# Patient Record
Sex: Female | Born: 1935 | Race: Black or African American | Hispanic: No | Smoking: Never smoker
Health system: Southern US, Community
[De-identification: ages and names within clinical notes are randomized; demographics above are authoritative.]

## PROBLEM LIST (undated history)

## (undated) DIAGNOSIS — I10 Essential (primary) hypertension: Secondary | ICD-10-CM

## (undated) DIAGNOSIS — E559 Vitamin D deficiency, unspecified: Secondary | ICD-10-CM

## (undated) DIAGNOSIS — E785 Hyperlipidemia, unspecified: Secondary | ICD-10-CM

## (undated) DIAGNOSIS — H409 Unspecified glaucoma: Secondary | ICD-10-CM

## (undated) DIAGNOSIS — M810 Age-related osteoporosis without current pathological fracture: Secondary | ICD-10-CM

## (undated) DIAGNOSIS — N184 Chronic kidney disease, stage 4 (severe): Secondary | ICD-10-CM

## (undated) DIAGNOSIS — M109 Gout, unspecified: Secondary | ICD-10-CM

## (undated) HISTORY — DX: Hyperlipidemia, unspecified: E78.5

## (undated) HISTORY — DX: Age-related osteoporosis without current pathological fracture: M81.0

## (undated) HISTORY — DX: Chronic kidney disease, stage 4 (severe): N18.4

## (undated) HISTORY — DX: Vitamin D deficiency, unspecified: E55.9

## (undated) HISTORY — PX: EYE SURGERY: SHX253

## (undated) HISTORY — DX: Unspecified glaucoma: H40.9

## (undated) HISTORY — DX: Essential (primary) hypertension: I10

## (undated) HISTORY — DX: Gout, unspecified: M10.9

---

## 1968-01-23 HISTORY — PX: BREAST SURGERY: SHX581

## 1999-01-23 HISTORY — PX: TEMPORAL ARTERY BIOPSY / LIGATION: SUR132

## 1999-05-02 ENCOUNTER — Encounter: Payer: Self-pay | Admitting: Emergency Medicine

## 1999-05-02 ENCOUNTER — Encounter: Admission: RE | Admit: 1999-05-02 | Discharge: 1999-05-02 | Payer: Self-pay | Admitting: Emergency Medicine

## 1999-09-15 ENCOUNTER — Encounter: Payer: Self-pay | Admitting: Emergency Medicine

## 1999-09-15 ENCOUNTER — Encounter: Admission: RE | Admit: 1999-09-15 | Discharge: 1999-09-15 | Payer: Self-pay | Admitting: Emergency Medicine

## 1999-09-26 ENCOUNTER — Ambulatory Visit (HOSPITAL_COMMUNITY): Admission: RE | Admit: 1999-09-26 | Discharge: 1999-09-26 | Payer: Self-pay | Admitting: *Deleted

## 1999-09-26 ENCOUNTER — Encounter (INDEPENDENT_AMBULATORY_CARE_PROVIDER_SITE_OTHER): Payer: Self-pay

## 2000-10-16 ENCOUNTER — Encounter (INDEPENDENT_AMBULATORY_CARE_PROVIDER_SITE_OTHER): Payer: Self-pay | Admitting: *Deleted

## 2000-10-16 ENCOUNTER — Ambulatory Visit (HOSPITAL_COMMUNITY): Admission: RE | Admit: 2000-10-16 | Discharge: 2000-10-16 | Payer: Self-pay | Admitting: *Deleted

## 2002-11-01 ENCOUNTER — Emergency Department (HOSPITAL_COMMUNITY): Admission: EM | Admit: 2002-11-01 | Discharge: 2002-11-01 | Payer: Self-pay | Admitting: Emergency Medicine

## 2002-11-01 ENCOUNTER — Encounter: Payer: Self-pay | Admitting: Emergency Medicine

## 2003-05-24 ENCOUNTER — Encounter: Admission: RE | Admit: 2003-05-24 | Discharge: 2003-05-24 | Payer: Self-pay | Admitting: Emergency Medicine

## 2003-10-19 ENCOUNTER — Other Ambulatory Visit: Admission: RE | Admit: 2003-10-19 | Discharge: 2003-10-19 | Payer: Self-pay | Admitting: Obstetrics and Gynecology

## 2003-12-14 ENCOUNTER — Emergency Department (HOSPITAL_COMMUNITY): Admission: EM | Admit: 2003-12-14 | Discharge: 2003-12-14 | Payer: Self-pay | Admitting: Emergency Medicine

## 2004-06-23 ENCOUNTER — Ambulatory Visit (HOSPITAL_COMMUNITY): Admission: RE | Admit: 2004-06-23 | Discharge: 2004-06-23 | Payer: Self-pay | Admitting: Obstetrics and Gynecology

## 2004-11-30 ENCOUNTER — Other Ambulatory Visit: Admission: RE | Admit: 2004-11-30 | Discharge: 2004-11-30 | Payer: Self-pay | Admitting: Obstetrics and Gynecology

## 2005-02-12 ENCOUNTER — Encounter: Admission: RE | Admit: 2005-02-12 | Discharge: 2005-02-12 | Payer: Self-pay | Admitting: Obstetrics and Gynecology

## 2005-09-05 ENCOUNTER — Ambulatory Visit: Payer: Self-pay | Admitting: Cardiology

## 2005-09-05 ENCOUNTER — Encounter: Payer: Self-pay | Admitting: Cardiology

## 2005-09-05 ENCOUNTER — Ambulatory Visit: Admission: RE | Admit: 2005-09-05 | Discharge: 2005-09-05 | Payer: Self-pay | Admitting: Emergency Medicine

## 2006-02-08 ENCOUNTER — Ambulatory Visit: Payer: Self-pay | Admitting: Cardiovascular Disease

## 2006-02-26 ENCOUNTER — Encounter: Admission: RE | Admit: 2006-02-26 | Discharge: 2006-02-26 | Payer: Self-pay | Admitting: Nephrology

## 2006-03-05 ENCOUNTER — Ambulatory Visit: Payer: Self-pay | Admitting: Cardiovascular Disease

## 2006-09-04 ENCOUNTER — Ambulatory Visit: Payer: Self-pay | Admitting: Cardiovascular Disease

## 2009-05-24 ENCOUNTER — Encounter: Admission: RE | Admit: 2009-05-24 | Discharge: 2009-05-24 | Payer: Self-pay | Admitting: Family Medicine

## 2010-06-06 NOTE — Assessment & Plan Note (Signed)
[facility] HEALTHCARE                            CARDIOLOGY OFFICE NOTE   NAME:Kimberly Avery, Kimberly Avery                 MRN:          [id number]  DATE:09/04/2006                            DOB:          Apr 18, 1923    Kimberly Avery returns today for followup.  I have seen her in the past  for her PACs, palpitations, some postural lightheadedness and  hypertension.   The patient, actually, has been doing fairly well.  I am a little bit  concerned about her weight loss, she seems to have lost quite a bit of  weight without trying.  She indicates 2 years ago weighing 140 pounds or  so, and now she is down to 108.  Her initial weight when I saw her in  January of 2008 was 111.   I told her to follow up with Dr. Lorenz Avery for this in regards to  possibility of occult cancer.   This also does not help the patient's postural symptoms.  She brought me  a list of blood pressures from home.  She can drop as low as 80  systolic.  She really has not had that much in regards to dizziness or  presyncope.  For the most part her pressures have been over 110.   Last time I saw her I told her to stop her indapamide on the days where  her blood pressure seems to be running low.  This seems to have helped.  She has not had any significant PND or orthopnea, no lower extremity  edema, no chest pain and no syncope.   She has been compliant with her other meds.   We did a 2D echocardiogram on the patient, and there was an EF of 60%  with no significant valvular heart disease.   Her review of systems is otherwise negative.   MEDICATIONS:  1. Lisinopril 20 a day.  2. Indapamide 2.5 mg on days when her blood pressure is high.  3. Toprol 100 a day.  4. An aspirin a day.  5. Actonel 35 a day.  6. Lipitor 10 a day.   PHYSICAL EXAMINATION:  Remarkable for an elderly black female in no  distress.  She is quite asthenic.  Affect is appropriate.  Weight is 108, blood pressure is 155/73  sitting and 160/80 standing,  pulse is 62 and regular.  Respiratory rate is 12.  She is afebrile.  HEENT:  Normal.  NECK:  Supple.  There is no lymphadenopathy, no thyromegaly, no JVP  elevation, no bruits.  LUNGS:  Clear with good diaphragmatic motion, no wheezing.  There is an S1, S2 with normal heart sounds.  PMI is normal.  ABDOMEN:  Benign, bowel sounds are positive.  No tenderness, no  hepatosplenomegaly, no hepatojugular reflux, no triple A, no bruits.  Distal pulses are intact with no edema.  NEURO:  Nonfocal.  SKIN:  Warm and dry.  There is no muscular weakness.   IMPRESSION:  1. Fluctuating blood pressure, hypertension, well controlled.  Would      not increase her medicine any more given some of the fluctuations  that she has.  She will continue her Toprol and ACE inhibitor as      well as low-salt diet.  2. Periods of hypotension.  I am not sure if this is related to her      weight loss.  I would ask Dr. Lorenz Avery to do a random cortisol level      and a TSH and T4.  I think that the indapamide would be the drug to      hold from time to time.  She will call me if her postural symptoms      get any worse.  I am loathe to start her on Florinef or __________      since she can have early significant systolic hypertension.  3. History of premature atrial contractions.  No evidence of      arrhythmia and no evidence of A-flutter or A-fib.  She will have a      CardioNet monitor if her palpitations were to return.  4. Hypercholesterolemia.  Continue Lipitor 10 mg a day, followup lipid      and liver profile in 6 months.     Kimberly Pick. Eden Emms, MD, [facility]  Electronically Signed    PCN/MedQ  DD: 09/04/2006  DT: 09/05/2006  Job #: [id number]   cc:   Kimberly Avery, M.D.

## 2010-06-09 NOTE — Op Note (Signed)
[facility]  Patient:    Kimberly Avery, Kimberly Avery                 MRN: [id number] Proc. Date: 09/26/99 Adm. Date:  [id number] Attending:  Stephenie Acres CC:         Reuben Likes, M.D.                           Operative Report  PREOPERATIVE DIAGNOSES:  Headaches.  POSTOPERATIVE DIAGNOSES:  Headaches.  PROCEDURE:  Left temporal artery biopsy.  ANESTHESIA:  Local.  SURGEON:  Danna Hefty.  DESCRIPTION OF PROCEDURE:  The patient was taken to the minor room. The left temporal region was prepped and draped in the normal sterile fashion.  Using 1% lidocaine and local anesthesia, the skin and subcutaneous tissue in the preauricular region was anesthetized. A small AP incision was made, dissected down onto the temporal artery. This was dissected free of surrounding structures. The 2 ends were ligated using 4-0 silk ligatures and approximately a 1 cm segment was taken. The proximal tie apparently became loose and therefore there was some bleeding. The edge of the artery was then regrasped and suture ligated using a 4-0 silk ligature. Adequate hemostasis was then insured. The skin was closed with 4-0 monocryl, Steri-Strips were applied. The patient tolerated the procedure well. DD:  09/26/99 TD:  09/26/99 Job: [id number]

## 2010-06-09 NOTE — Assessment & Plan Note (Signed)
[facility] HEALTHCARE                            CARDIOLOGY OFFICE NOTE   NAME:Kimberly Avery, Kimberly Avery                 MRN:          [id number]  DATE:03/05/2006                            DOB:          [birth year]    SUBJECTIVE:  Kimberly Avery returns today for followup.  She is a delightful  77 year old patient.  She has significant hypertension.  Her blood  pressure readings at home have been adequate.  At times she does drop  below 100, but is asymptomatic.  I told her in the future that she  should stop her indapamide if this happens.  She had a normal 2-D  echocardiogram.  Despite her age, I do not think she needs a stress  test.  She has a normal baseline electrocardiogram.  She is not having  any significant chest pain.  She had been having palpitations and short  bursts of PSVT.  These are improved on Toprol.  She has not had any  syncope, PND or orthopnea or lower extremity edema.   PHYSICAL EXAMINATION:  VITAL SIGNS:  Today the blood pressure is 130/70,  pulse 70 and regular.  There are no PACs.  HEENT:  Normal.  NECK:  Carotids without bruits.  LUNGS:  Clear.  HEART:  There is an S1 and S2.  Normal heart sounds.  ABDOMEN:  Benign.  EXTREMITIES:  Lower extremity pulses intact.  No edema.   MEDICATIONS:  1. Lisinopril 20 mg q.d.  2. Indapamide 2.5 mg q.d.  3. Toprol 100 mg q.d.  4. One aspirin q.d.  5. Actonel 35 mg weekly.  6. Lipitor 10 mg q.d.   IMPRESSION/PLAN:  1. Stable, blood pressure well-controlled on three medications.  The      patient to cut back on indapamide if her pressures consistently run      below 90.  2. The patient's premature atrial contractions and palpitations are      improved on beta blocker therapy.  3. Hyperlipidemia, well-controlled on 10 mg of Lipitor with an low-      density lipoprotein of less than 100.   As indicated, she has not had a stress test, and I do not think she  needs one since she is not having chest  pain and is active, with a  normal electrocardiogram.    Theron Arista C. Eden Emms, MD, [facility]  Electronically Signed   PCN/MedQ  DD: 03/05/2006  DT: 03/05/2006  Job #: [id number]

## 2010-06-09 NOTE — Assessment & Plan Note (Signed)
[facility] HEALTHCARE                            CARDIOLOGY OFFICE NOTE   NAME:Kimberly Avery, Kimberly Avery                 MRN:          [id number]  DATE:02/08/2006                            DOB:          [birth year]    Kimberly Avery is an 77 year old patient of Dr. Lorenz Coaster, who was referred  for palpitations and SVT.  The patient is actually quite healthy for her  age.  She tells me she has had multiple cardiac workups in the past;  however, I have no records of this.  She indicates having previous  echoes and stress tests.  Specifically, there was an echo from last year  at [facility].  She says she has always had a strong heart.  She has  had palpitations over the years.  About a month ago she had a  significant episode where her heart was racing for quite some time.  Dr.  Lorenz Coaster got a Holter monitor on her, and she indeed is having some fairly  frequent episodes of PSVT and frequent PACs.  I reviewed the monitor  strips, which took about 10 minutes.   She does not appear to have atrial fibrillation and would not appear to  need Coumadin.  Two weeks ago Dr. Lorenz Coaster increased her Toprol up to 100  mg a day, and this seems to have helped and her symptoms have quieted  some.  She has not had any significant syncope, chest pain, PND, or  orthopnea.   She does have a history of hypertension and is on medication for this.  She is a nondiabetic.  She does not smoke.   REVIEW OF SYSTEMS:  Otherwise remarkable for no significant TIA or CVA.  She has not had a previous history of atrial fibrillation.   She is retired.  She lives with her 48 year old husband.  Both of them  are in good shape.  Her husband was in the [employer] and she has 4 children,  3 of whom are also in the service.  They seem to be having a few more  problems, but overall the patient seems to be doing well for her age and  not under too much stress.  She has had previous left cataract eye  surgery in 2000,  previous left temporal artery biopsy in 2001, previous  subtotal mastectomy on the left in 1970.   Her mother had a cardiac arrest but at age 28, and her father had a CVA  at age 96.   There is no premature disease in the family in regard to the heart.   She is on:  1. Lisinopril 10 mg a day.  2. Indapamide 2.5 mg a day.  3. Toprol 100 mg a day.  4. Baby aspirin a day.  5. Calcium and vitamin D.   PHYSICAL EXAMINATION:  GENERAL:  She is a thin, healthy-appearing woman  who appears younger than her stated age.  HEENT:  Normal.  There is no thyromegaly, no lymphadenopathy.  LUNGS:  Clear.  There is an S1, S2, with a very short systolic murmur.  ABDOMEN:  Benign.  EXTREMITIES:  Lower extremities with  intact pulses.  No edema.  NEUROLOGIC:  Nonfocal.   EKG shows sinus rhythm with no acute changes and frequent PACs.   IMPRESSION:  The patient appears to have atrial arrhythmias that are not  the type that should require Coumadin.  She is taking a baby aspirin a  day.  She has only been on the higher dose of Toprol for about 2 weeks.   We will try to get her records to ensure that structurally she had a  normal heart.  She really does not have significant risk factors except  for hypertension and her age for coronary disease.  She is not having  chest pain, and I do not think a stress test is needed.  I will see her  back in about 4 weeks to see how her symptoms are doing.  If she were to  have increasing symptoms, it may be worthwhile to add a calcium channel  blocker to her Toprol.  She has tended toward bradycardia in the past  and her Toprol dose was actually decreased at one point.  I do not think  I would go any higher than 100 mg of Toprol in an octogenarian.  When I  see her in 4 weeks if she continues to be fairly asymptomatic, I think  that I would just keep her on the beta blocker.   Clearly, at her age I would like to avoid any kind of antiarrhythmics in  terms of  suppressing her atrial beats, as these may be proarrhythmic in  the ventricle.   I talked at length to the patient about the anticoagulation issues and  do not think she needs Coumadin, and she will continue taking a baby  aspirin.  Further recommendations will be based on my review of her echo  from [facility] and other records that I can find and follow-up visit  in 4 weeks.     Kimberly Pick. Eden Emms, MD, [facility]  Electronically Signed    PCN/MedQ  DD: 02/08/2006  DT: 02/08/2006  Job #: [id number]   cc:   Reuben Likes, M.D.

## 2011-07-13 ENCOUNTER — Ambulatory Visit
Admission: RE | Admit: 2011-07-13 | Discharge: 2011-07-13 | Disposition: A | Discharge status: Home / Self Care | Payer: Federal, State, Local not specified - PPO | Source: Ambulatory Visit | Attending: Family Medicine | Admitting: Family Medicine

## 2011-07-13 ENCOUNTER — Other Ambulatory Visit: Payer: Self-pay | Admitting: Family Medicine

## 2011-07-13 DIAGNOSIS — R05 Cough: Secondary | ICD-10-CM

## 2012-12-10 ENCOUNTER — Other Ambulatory Visit: Payer: Self-pay | Admitting: Gastroenterology

## 2012-12-10 DIAGNOSIS — R109 Unspecified abdominal pain: Secondary | ICD-10-CM

## 2012-12-15 ENCOUNTER — Ambulatory Visit
Admission: RE | Admit: 2012-12-15 | Discharge: 2012-12-15 | Disposition: A | Discharge status: Home / Self Care | Payer: Federal, State, Local not specified - PPO | Source: Ambulatory Visit | Attending: Gastroenterology | Admitting: Gastroenterology

## 2012-12-15 DIAGNOSIS — R109 Unspecified abdominal pain: Secondary | ICD-10-CM

## 2013-02-11 ENCOUNTER — Other Ambulatory Visit: Payer: Self-pay | Admitting: Gastroenterology

## 2013-02-11 DIAGNOSIS — R1013 Epigastric pain: Secondary | ICD-10-CM

## 2013-02-26 ENCOUNTER — Ambulatory Visit
Admission: RE | Admit: 2013-02-26 | Discharge: 2013-02-26 | Disposition: A | Discharge status: Home / Self Care | Payer: Federal, State, Local not specified - PPO | Source: Ambulatory Visit | Attending: Gastroenterology | Admitting: Gastroenterology

## 2013-02-26 DIAGNOSIS — R1013 Epigastric pain: Secondary | ICD-10-CM

## 2013-07-23 ENCOUNTER — Other Ambulatory Visit: Payer: Self-pay | Admitting: Family Medicine

## 2013-07-23 ENCOUNTER — Ambulatory Visit
Admission: RE | Admit: 2013-07-23 | Discharge: 2013-07-23 | Disposition: A | Discharge status: Home / Self Care | Payer: Federal, State, Local not specified - PPO | Source: Ambulatory Visit | Attending: Family Medicine | Admitting: Family Medicine

## 2013-07-23 DIAGNOSIS — M25532 Pain in left wrist: Secondary | ICD-10-CM

## 2013-09-21 ENCOUNTER — Encounter: Payer: Self-pay | Admitting: Internal Medicine

## 2013-09-21 ENCOUNTER — Ambulatory Visit (INDEPENDENT_AMBULATORY_CARE_PROVIDER_SITE_OTHER): Payer: Federal, State, Local not specified - PPO | Admitting: Internal Medicine

## 2013-09-21 VITALS — BP 113/66 | HR 65 | Ht 64.0 in | Wt 91.4 lb

## 2013-09-21 DIAGNOSIS — I951 Orthostatic hypotension: Secondary | ICD-10-CM

## 2013-09-21 DIAGNOSIS — I491 Atrial premature depolarization: Secondary | ICD-10-CM

## 2013-09-21 DIAGNOSIS — R55 Syncope and collapse: Secondary | ICD-10-CM

## 2013-09-21 MED ORDER — METOPROLOL TARTRATE 25 MG PO TABS: 12.5000 mg | ORAL_TABLET | Freq: Two times a day (BID) | ORAL | Status: DC

## 2013-09-21 NOTE — Patient Instructions (Addendum)
Your physician has requested that you have an echocardiogram. Echocardiography is a painless test that uses sound waves to create images of your heart. It provides your doctor with information about the size and shape of your heart and how well your heart's chambers and valves are working. This procedure takes approximately one hour. There are no restrictions for this procedure.  Your physician has recommended you make the following change in your medication.Marland Kitchen TAKE metoprolol tartrate 12.5mg  twice daily.  STOP indapamide.   Your physician recommends that you schedule a follow-up appointment after your echo.

## 2013-09-21 NOTE — Progress Notes (Signed)
Patient ID: Kimberly Avery, female   DOB: 09/08/1923, 80 y.o.   MRN: [id number]    OFFICE NOTE  Chief Complaint:  Syncope  Primary Care Physician: Marjorie Smolder, MD  HPI:  Kimberly Avery is an 80 yo female with history of HTN, HLD, CKD stage 4 presenting for new patient evaluation.  Seen as a new patient for syncopal event 2 months ago. Notes she was walking outside around her property and was ambulating without any difficulty. Was going up a slight incline and the next thing she knows she was on the ground. She states she was laying flat when she noticed she was on the ground. Does not remember falling, though remembers feeling her head hit when she landed. Did not trip and states this was not a mechanical fall. Denies chest pain, shortness of breath, dizziness, palpitations, no light headedness prior to this event. She has never had a syncopal event before. No incontinence. No history of seizure. No post-ictal period. No more syncopal events. Tries to drink a lot of fluid, though was told she was drinking too much and now has been drinking less.  Notes some light headedness with getting up too quick. Does not occur too often and does ok if she gets up slowly. Daughter notes she stumbles and looses balance a lot. Notes an additional mechanical fall recently as well. No history of cardiac issues. No history of catheterization or stress test.   Notes she has HTN and has issues with this being up and down like a see-saw. Lowest blood pressure this week was 94/59 with a pulse of 59. Highest BP was 156/85 with pulse of 73.   Of note she has seen Dr Johnsie Cancel in the past for her HTN. He noted that she had a normal echo in 05/2010. He also noted she had PACs and short bursts of PSVT that improved with addition of metoprolol. Was last seen in 05/2010.  PMHx:  Past Medical History  Diagnosis Date  . Hypertension   . Hyperlipidemia   . Gout   . Osteoporosis   . CKD (chronic kidney  disease) stage 4, GFR 15-29 ml/min   . Glaucoma   . Vitamin D deficiency     Past Surgical History  Procedure Laterality Date  . Breast surgery Bilateral 1970    mastectomies - fiboradenoma  . Eye surgery  2003, 2004  . Temporal artery biopsy / ligation  2001    FAMHx:  Family History  Problem Relation Age of Onset  . Heart attack Mother     MI while in hospital, RA, HTN  . Stroke Father     MI, CAD, alcoholism  . Cancer Brother     prostate cancer  . Heart Problems Sister     pacemaker, DM, RA, HTN    SOCHx:   reports that she has never smoked. She has never used smokeless tobacco. She reports that she does not drink alcohol or use illicit drugs.  ALLERGIES:  No Known Allergies  ROS: A comprehensive review of systems was negative except for: Cardiovascular: positive for syncope and light headedness  HOME MEDS: Current Outpatient Prescriptions  Medication Sig Dispense Refill  . alendronate (FOSAMAX) 70 MG tablet Take 1 tablet by mouth once a week.      . ALPRAZolam (XANAX) 0.5 MG tablet Take 0.25-0.5 mg by mouth at bedtime as needed for anxiety.      Marland Kitchen amLODipine-benazepril (LOTREL) 5-20 MG per capsule Take 1 capsule by mouth daily.      Marland Kitchen  aspirin 81 MG tablet Take 81 mg by mouth daily.      . dorzolamide-timolol (COSOPT) 22.3-6.8 MG/ML ophthalmic solution Place 1 drop into both eyes 2 (two) times daily.      . indapamide (LOZOL) 2.5 MG tablet Take 2.5 mg by mouth daily.      . metoprolol (LOPRESSOR) 50 MG tablet Take 25 mg by mouth daily.      . simvastatin (ZOCOR) 20 MG tablet Take 1 tablet by mouth daily.      . Vitamin D, Ergocalciferol, (DRISDOL) 50000 UNITS CAPS capsule Take 1 capsule by mouth every 14 (fourteen) days.       No current facility-administered medications for this visit.    LABS/IMAGING: No results found for this or any previous visit (from the past 48 hour(s)). No results found.  VITALS: BP 142/68  Pulse 80  Ht _0  (1.626 m)  Wt 91 lb  6.4 oz (41.459 kg)  BMI 15.68 kg/m2  EXAM: General appearance: alert, cooperative and no distress Neck: no adenopathy, no carotid bruit, no JVD and supple, symmetrical, trachea midline Lungs: clear to auscultation bilaterally Heart: regular rate and rhythm, S1, S2 normal, no murmur, click, rub or gallop Abdomen: soft, non-tender; bowel sounds normal; no masses,  no organomegaly Extremities: extremities normal, atraumatic, no cyanosis or edema Skin: Skin color, texture, turgor normal. No rashes or lesions Neurologic: CN 2-12 intact, 5/5 strength in bilateral biceps, triceps, grip, quads, hamstrings, plantar and dorsiflexion, sensation to light touch intact in bilateral UE and LE, 2+ patellar reflexes  EKG: Sinus rhythm with PACs, voltage criteria met for LVH  ASSESSMENT: 1.  Syncope- potentially multifactorial with possible contributors being orthostatic hypotension, arrhythmia, structural heart disease, and mechanical falls. Was orthostatic in the office today. The fact that she had a syncopal event with no prodrome is concerning for arrhythmia or structural issue. Note EKG with PACs. Her recent weight loss has also likely changed her need for so many blood pressure medications and she is likely over treated at this time.  2. HTN- over treated at this time. With orthostatic hypotension and low blood pressures at home.  3. Hyperlipidemia- on simvastatin.  PLAN: 1.   Will plan on performing an echo to evaluate for structural abnormalities that could lead to her syncopal events. We will decrease her metoprolol to 12.5 mg BID and discontinue her indapamide. At this time will hold off on monitor for arrhythmia as she's only had one episode. If she has a recurrent episode will consider loop recorder for further evaluation of arrhythmia. Will plan for follow-up after echo has been completed.    Tommi Rumps 09/21/2013, 2:34 PM  Pt. Seen and examined. Agree with the resident note as written.  Very pleasant 80 year old female who is quite thin and has had significant weight loss recently. She is describing positional dizziness and he was orthostatic in the office today with a 25 mmHg blood pressure change. There is no heart rate change on account of her beta-blockade. In addition she is on indapamide, which is associated with orthostatic hypotension. Recently she's had blood pressures which have been in the 90s. I've recommended stopping her indapamide and decreasing her metoprolol. This should help with her positional dizziness. However, I do not feel that this was vasovagal or neurocardiogenic syncope. She does not describe a prodrome prior to the events which is typical for such a condition. I am concerned about a primary arrhythmia. She was noted to have frequent PACs today. I would recommend  an echocardiogram and we will consider monitoring or perhaps a loop recorder placement in the future if she has another event.  Thank you again for the kind referral.  Pixie Casino, MD, [facility] Attending Cardiologist [place]

## 2013-09-25 ENCOUNTER — Ambulatory Visit (HOSPITAL_COMMUNITY)
Admission: RE | Admit: 2013-09-25 | Discharge: 2013-09-25 | Disposition: A | Discharge status: Home / Self Care | Payer: Federal, State, Local not specified - PPO | Source: Ambulatory Visit | Attending: Cardiovascular Disease | Admitting: Cardiovascular Disease

## 2013-09-25 DIAGNOSIS — I129 Hypertensive chronic kidney disease with stage 1 through stage 4 chronic kidney disease, or unspecified chronic kidney disease: Secondary | ICD-10-CM | POA: Diagnosis not present

## 2013-09-25 DIAGNOSIS — I080 Rheumatic disorders of both mitral and aortic valves: Secondary | ICD-10-CM | POA: Diagnosis not present

## 2013-09-25 DIAGNOSIS — I079 Rheumatic tricuspid valve disease, unspecified: Secondary | ICD-10-CM | POA: Insufficient documentation

## 2013-09-25 DIAGNOSIS — I359 Nonrheumatic aortic valve disorder, unspecified: Secondary | ICD-10-CM

## 2013-09-25 DIAGNOSIS — E785 Hyperlipidemia, unspecified: Secondary | ICD-10-CM | POA: Insufficient documentation

## 2013-09-25 DIAGNOSIS — R55 Syncope and collapse: Secondary | ICD-10-CM | POA: Insufficient documentation

## 2013-09-25 DIAGNOSIS — I2510 Atherosclerotic heart disease of native coronary artery without angina pectoris: Secondary | ICD-10-CM | POA: Insufficient documentation

## 2013-09-25 DIAGNOSIS — N189 Chronic kidney disease, unspecified: Secondary | ICD-10-CM | POA: Diagnosis not present

## 2013-09-25 NOTE — Progress Notes (Signed)
2D Echocardiogram Complete.  09/25/2013   Nelva Hauk, RDCS  

## 2013-10-06 ENCOUNTER — Ambulatory Visit (INDEPENDENT_AMBULATORY_CARE_PROVIDER_SITE_OTHER): Payer: Federal, State, Local not specified - PPO | Admitting: Internal Medicine

## 2013-10-06 ENCOUNTER — Encounter: Payer: Self-pay | Admitting: Internal Medicine

## 2013-10-06 ENCOUNTER — Ambulatory Visit (INDEPENDENT_AMBULATORY_CARE_PROVIDER_SITE_OTHER): Payer: Federal, State, Local not specified - PPO | Admitting: *Deleted

## 2013-10-06 VITALS — BP 122/62 | HR 56 | Ht 64.0 in | Wt 96.8 lb

## 2013-10-06 DIAGNOSIS — R55 Syncope and collapse: Secondary | ICD-10-CM

## 2013-10-06 DIAGNOSIS — I951 Orthostatic hypotension: Secondary | ICD-10-CM

## 2013-10-06 DIAGNOSIS — I491 Atrial premature depolarization: Secondary | ICD-10-CM

## 2013-10-06 NOTE — Progress Notes (Signed)
Patient ID: Kimberly Avery, female   DOB: 1924-01-13, 80 y.o.   MRN: [id number]    OFFICE NOTE  Chief Complaint:  Syncope  Primary Care Physician: Hollice Espy, MD  HPI:  Kimberly Avery is an 80 yo female with history of HTN, HLD, CKD stage 4 presenting for new patient evaluation.  Seen as a new patient for syncopal event 2 months ago. Notes she was walking outside around her property and was ambulating without any difficulty. Was going up a slight incline and the next thing she knows she was on the ground. She states she was laying flat when she noticed she was on the ground. Does not remember falling, though remembers feeling her head hit when she landed. Did not trip and states this was not a mechanical fall. Denies chest pain, shortness of breath, dizziness, palpitations, no light headedness prior to this event. She has never had a syncopal event before. No incontinence. No history of seizure. No post-ictal period. No more syncopal events. Tries to drink a lot of fluid, though was told she was drinking too much and now has been drinking less.  Notes some light headedness with getting up too quick. Does not occur too often and does ok if she gets up slowly. Daughter notes she stumbles and looses balance a lot. Notes an additional mechanical fall recently as well. No history of cardiac issues. No history of catheterization or stress test.   Notes she has HTN and has issues with this being up and down like a see-saw. Lowest blood pressure this week was 94/59 with a pulse of 59. Highest BP was 156/85 with pulse of 73.   Of note she has seen Dr Eden Emms in the past for her HTN. He noted that she had a normal echo in 05/2010. He also noted she had PACs and short bursts of PSVT that improved with addition of metoprolol. Was last seen in 05/2010.  Kimberly Avery returns today for followup. She reports no further syncopal episodes. We have asked her to stop her diuretic but she reports that  she did not do that for a known reasons. She continues to have some leg swelling. Her echocardiogram was performed and did not show any significant abnormalities. LVEF was 60-65% with mild AI and mild MR.  PMHx:  Past Medical History  Diagnosis Date  . Hypertension   . Hyperlipidemia   . Gout   . Osteoporosis   . CKD (chronic kidney disease) stage 4, GFR 15-29 ml/min   . Glaucoma   . Vitamin D deficiency     Past Surgical History  Procedure Laterality Date  . Breast surgery Bilateral 1970    mastectomies - fiboradenoma  . Eye surgery  2003, 2004  . Temporal artery biopsy / ligation  2001    FAMHx:  Family History  Problem Relation Age of Onset  . Heart attack Mother     MI while in hospital, RA, HTN  . Stroke Father     MI, CAD, alcoholism  . Cancer Brother     prostate cancer  . Heart Problems Sister     pacemaker, DM, RA, HTN    SOCHx:   reports that she has never smoked. She has never used smokeless tobacco. She reports that she does not drink alcohol or use illicit drugs.  ALLERGIES:  No Known Allergies  ROS: A comprehensive review of systems was negative except for: Cardiovascular: positive for syncope and light headedness  HOME MEDS: Current Outpatient  Prescriptions  Medication Sig Dispense Refill  . alendronate (FOSAMAX) 70 MG tablet Take 1 tablet by mouth once a week.      . ALPRAZolam (XANAX) 0.5 MG tablet Take 0.25-0.5 mg by mouth at bedtime as needed for anxiety.      Marland Kitchen amLODipine-benazepril (LOTREL) 5-20 MG per capsule Take 1 capsule by mouth daily.      Marland Kitchen aspirin 81 MG tablet Take 81 mg by mouth daily.      . dorzolamide-timolol (COSOPT) 22.3-6.8 MG/ML ophthalmic solution Place 1 drop into both eyes 2 (two) times daily.      . metoprolol (LOPRESSOR) 25 MG tablet Take 0.5 tablets (12.5 mg total) by mouth 2 (two) times daily.  30 tablet  6  . SIMETHICONE PO Take by mouth as needed.      . simvastatin (ZOCOR) 20 MG tablet Take 1 tablet by mouth daily.       . Vitamin D, Ergocalciferol, (DRISDOL) 50000 UNITS CAPS capsule Take 1 capsule by mouth every 14 (fourteen) days.       No current facility-administered medications for this visit.    LABS/IMAGING: No results found for this or any previous visit (from the past 48 hour(s)). No results found.  VITALS: BP 122/62  Pulse 56  Ht  (1.626 m)  Wt 96 lb 12.8 oz (43.908 kg)  BMI 16.61 kg/m2  EXAM: deferred  EKG: deferred  ASSESSMENT: 1.  Syncope- potentially multifactorial with possible contributors being orthostatic hypotension, arrhythmia, structural heart disease, and mechanical falls. Was orthostatic in the office today. The fact that she had a syncopal event with no prodrome is concerning for arrhythmia or structural issue. Note EKG with PACs. Her recent weight loss has also likely changed her need for so many blood pressure medications and she is likely over treated at this time.  2. HTN- over treated at this time. With orthostatic hypotension and low blood pressures at home.  3. Hyperlipidemia- on simvastatin.  PLAN: 1.   [name] has not had a reoccurrence of her syncope but remained orthostatic. She did not stop her diuretic is we have advised her to. In addition I think she would benefit from lower extremity compression stockings that are thigh high 20-30 mmHg. I recommend continuing this therapy, encouraging salt intake and liberalizing her diet as she is underweight and low protein status is probably contributing to her orthostatic hypotension.  Chrystie Nose, MD, [facility] Attending Cardiologist [facility]  Mackenzie Groom C 10/06/2013, 5:28 PM

## 2013-10-06 NOTE — Patient Instructions (Signed)
Compression Stockings - Thigh High 20-69mmHg  Your physician recommends that you schedule a follow-up appointment as needed.

## 2013-10-06 NOTE — Progress Notes (Signed)
Came in for compression stockings.  Measurements to be done  Right & Left leg:  Ankle 7.5                            Calf  11.5                            Thigh15.5  SIZE:  1  We do not carry size 1.  Info for a facility in [place] that might carry her size.  Rx with measurements given to patient along with the name and # of [employer] [id number].  If they are unable to supply her with her size recommended she go online since she is unable to afford the hose at the [employer] store.

## 2013-10-21 ENCOUNTER — Telehealth: Payer: Self-pay | Admitting: Internal Medicine

## 2013-10-21 NOTE — Telephone Encounter (Signed)
Pt would like  To Jenna about the compression soaks that Dr.Hilty instructed her to wear. She would like to know will she have to wear these soaks long term. Please call  Thanks

## 2013-10-21 NOTE — Telephone Encounter (Signed)
Spoke with patient. Informed her that Dr. Rennis GoldenHilty prescribed the stockings to help with syncope and to help promote blood return to heart. She was wondering if she will need to wear long-term and I informed her likely so, given her fluctuating BP and that it had gotten so low at times she passed out. Patient voiced understanding .

## 2013-10-29 ENCOUNTER — Other Ambulatory Visit: Payer: Self-pay | Admitting: *Deleted

## 2013-10-29 MED ORDER — METOPROLOL TARTRATE 25 MG PO TABS: 12.5000 mg | ORAL_TABLET | Freq: Two times a day (BID) | ORAL | Status: DC

## 2013-10-29 NOTE — Telephone Encounter (Signed)
Medication refilled electronically with adjustment to 90 supply

## 2013-11-02 ENCOUNTER — Telehealth: Payer: Self-pay | Admitting: Internal Medicine

## 2013-11-02 NOTE — Telephone Encounter (Signed)
Left message for pt to call.

## 2013-11-02 NOTE — Telephone Encounter (Signed)
Patient has some questions regarding her medications.

## 2013-11-02 NOTE — Telephone Encounter (Signed)
Spoke with pt, medication list reviewed with the patient.

## 2013-11-16 ENCOUNTER — Telehealth: Payer: Self-pay | Admitting: Internal Medicine

## 2013-11-16 NOTE — Telephone Encounter (Signed)
SPOKE TO PATIENT SHE STATES HER BLOOD PRESSURE HAS BEEN ELEVATED FOR THE LAST TWO DAYS YESTERDAY RANGE FROM 212 -183 SBP TODAY'S 210/94 PULSE 64 199/68, 187/87 PULSE 63, 184/91 PULSE 64  PATIENT STATES SHE HAS BEEN WEARING HER COMPRESSION STOCKING. NO NEW DIETARY INTAKE - SHE STATES SHE DID HAVE SOME BAKED SHRIMP ,   RN INFORMED PATIENT TO CONTINUE TO MONITOR-WILL DEFER TO DR HILTY  IF PATIENT NEEDS AN APPOINTMENT OR READJUST MEDICATION

## 2013-11-16 NOTE — Telephone Encounter (Signed)
Pt is having problem with her blood pressure,it started yesterday. Yesterdayit was 212,this morning it was 212 and now it 187.Pt thinks she needs to be seen.

## 2013-11-17 NOTE — Telephone Encounter (Signed)
Patient reports BP is 167 systolic at 8am today Patient would like to be seen r/t high BP Appointment made for 11/19/13 @ 330pm

## 2013-11-17 NOTE — Telephone Encounter (Signed)
If bp is still elevated, will need appointment (MLP or me this week).  Dr. HRexene Edison

## 2013-11-19 ENCOUNTER — Encounter: Payer: Self-pay | Admitting: Internal Medicine

## 2013-11-19 ENCOUNTER — Ambulatory Visit (INDEPENDENT_AMBULATORY_CARE_PROVIDER_SITE_OTHER): Payer: Federal, State, Local not specified - PPO | Admitting: Internal Medicine

## 2013-11-19 VITALS — BP 178/80 | HR 59 | Ht 64.0 in | Wt 94.9 lb

## 2013-11-19 DIAGNOSIS — I491 Atrial premature depolarization: Secondary | ICD-10-CM

## 2013-11-19 DIAGNOSIS — R55 Syncope and collapse: Secondary | ICD-10-CM

## 2013-11-19 DIAGNOSIS — I951 Orthostatic hypotension: Secondary | ICD-10-CM

## 2013-11-19 NOTE — Patient Instructions (Addendum)
Your physician wants you to follow-up in: 1 year. You will receive a reminder letter in the mail two months in advance. If you don't receive a letter, please call our office to schedule the follow-up appointment.  

## 2013-11-20 ENCOUNTER — Encounter: Payer: Self-pay | Admitting: Internal Medicine

## 2013-11-20 NOTE — Progress Notes (Signed)
Patient ID: Kimberly Parsonsrnestine E Nakamura, female   DOB: 12-17-1923, 80 y.o.   MRN: [id number]    OFFICE NOTE  Chief Complaint:  Syncope  Primary Care Physician: Hollice EspyGATES,DONNA RUTH, MD  HPI:  Kimberly Avery is an 80 yo female with history of HTN, HLD, CKD stage 4 presenting for new patient evaluation.  Seen as a new patient for syncopal event 2 months ago. Notes she was walking outside around her property and was ambulating without any difficulty. Was going up a slight incline and the next thing she knows she was on the ground. She states she was laying flat when she noticed she was on the ground. Does not remember falling, though remembers feeling her head hit when she landed. Did not trip and states this was not a mechanical fall. Denies chest pain, shortness of breath, dizziness, palpitations, no light headedness prior to this event. She has never had a syncopal event before. No incontinence. No history of seizure. No post-ictal period. No more syncopal events. Tries to drink a lot of fluid, though was told she was drinking too much and now has been drinking less.  Notes some light headedness with getting up too quick. Does not occur too often and does ok if she gets up slowly. Daughter notes she stumbles and looses balance a lot. Notes an additional mechanical fall recently as well. No history of cardiac issues. No history of catheterization or stress test.   Notes she has HTN and has issues with this being up and down like a see-saw. Lowest blood pressure this week was 94/59 with a pulse of 59. Highest BP was 156/85 with pulse of 73.   Of note she has seen Dr Eden EmmsNishan in the past for her HTN. He noted that she had a normal echo in 05/2010. He also noted she had PACs and short bursts of PSVT that improved with addition of metoprolol. Was last seen in 05/2010.  Kimberly Avery returns today for followup. She reports no further syncopal episodes. Her blood pressure is actually quite elevated today however  recheck did come down to about 140/82. She continues to wear high compression stockings and has had no further syncopal episodes. She is having some problems with gas and bloating and is currently taking simethicone.  PMHx:  Past Medical History  Diagnosis Date  . Hypertension   . Hyperlipidemia   . Gout   . Osteoporosis   . CKD (chronic kidney disease) stage 4, GFR 15-29 ml/min   . Glaucoma   . Vitamin D deficiency     Past Surgical History  Procedure Laterality Date  . Breast surgery Bilateral 1970    mastectomies - fiboradenoma  . Eye surgery  2003, 2004  . Temporal artery biopsy / ligation  2001    FAMHx:  Family History  Problem Relation Age of Onset  . Heart attack Mother     MI while in hospital, RA, HTN  . Stroke Father     MI, CAD, alcoholism  . Cancer Brother     prostate cancer  . Heart Problems Sister     pacemaker, DM, RA, HTN    SOCHx:   reports that she has never smoked. She has never used smokeless tobacco. She reports that she does not drink alcohol or use illicit drugs.  ALLERGIES:  No Known Allergies  ROS: A comprehensive review of systems was negative except for: Gastrointestinal: positive for gas  HOME MEDS: Current Outpatient Prescriptions  Medication Sig Dispense Refill  .  alendronate (FOSAMAX) 70 MG tablet Take 1 tablet by mouth once a week.      . ALPRAZolam (XANAX) 0.5 MG tablet Take 0.25-0.5 mg by mouth at bedtime as needed for anxiety.      Marland Kitchen. amLODipine-benazepril (LOTREL) 5-20 MG per capsule Take 1 capsule by mouth daily.      Marland Kitchen. aspirin 81 MG tablet Take 81 mg by mouth daily.      . dorzolamide-timolol (COSOPT) 22.3-6.8 MG/ML ophthalmic solution Place 1 drop into both eyes 2 (two) times daily.      . metoprolol tartrate (LOPRESSOR) 25 MG tablet Take 0.5 tablets (12.5 mg total) by mouth 2 (two) times daily.  90 tablet  1  . SIMETHICONE PO Take by mouth as needed.      . simvastatin (ZOCOR) 20 MG tablet Take 1 tablet by mouth daily.       . Vitamin D, Ergocalciferol, (DRISDOL) 50000 UNITS CAPS capsule Take 1 capsule by mouth every 14 (fourteen) days.       No current facility-administered medications for this visit.    LABS/IMAGING: No results found for this or any previous visit (from the past 48 hour(s)). No results found.  VITALS: BP 178/80  Pulse 59  Ht 5\' 4"  (1.626 m)  Wt 94 lb 14.4 oz (43.046 kg)  BMI 16.28 kg/m2  EXAM: GEN: Awake, NAD HEENT: PERRLA. EOMI Lungs: Clear bilaterally Cardiovascular: Regular rate and rhythm, S1, S2, 2/6 systolic murmur at left lower sternal border, 1/6 diastolic murmur at apex Abdomen: Soft, nontender positive bowel sounds Extremities: No edema Neurologic: Grossly nonfocal Psych: Normal  EKG: deferred  ASSESSMENT: 1.  Syncope 2. HTN- over treated at this time. With orthostatic hypotension and low blood pressures at home.  3. Hyperlipidemia- on simvastatin.  PLAN: 1.   Kimberly Avery has not had a reoccurrence of her syncope but remained orthostatic. She seems to be doing better now with compression stockings. I'm okay with running her blood pressure somewhat higher. We will continue current medications plan to see her back annually.  Chrystie NoseKenneth C. Dyana Magner, MD, [facility] Attending Cardiologist [facility]  Commodore Bellew C 11/20/2013, 6:26 PM

## 2014-03-01 ENCOUNTER — Telehealth: Payer: Self-pay | Admitting: Internal Medicine

## 2014-03-01 NOTE — Telephone Encounter (Signed)
Mr.Potvin is calling because she is having a problem with gas and saw that Dr. Rennis GoldenHilty once prescribed Simethicone for her and wants to know if he can prescribe for her . Please call .   Thanks

## 2014-03-01 NOTE — Telephone Encounter (Signed)
Pt is calling in to retract her last message , this message was not meant for Dr.Hilty

## 2014-03-23 ENCOUNTER — Other Ambulatory Visit: Payer: Self-pay | Admitting: Family Medicine

## 2014-03-23 ENCOUNTER — Ambulatory Visit
Admission: RE | Admit: 2014-03-23 | Discharge: 2014-03-23 | Disposition: A | Discharge status: Home / Self Care | Payer: Federal, State, Local not specified - PPO | Source: Ambulatory Visit | Attending: Family Medicine | Admitting: Family Medicine

## 2014-03-23 DIAGNOSIS — R109 Unspecified abdominal pain: Secondary | ICD-10-CM

## 2014-03-23 DIAGNOSIS — R634 Abnormal weight loss: Secondary | ICD-10-CM

## 2014-03-24 ENCOUNTER — Telehealth: Payer: Self-pay | Admitting: Internal Medicine

## 2014-03-24 NOTE — Telephone Encounter (Signed)
Line busy

## 2014-03-24 NOTE — Telephone Encounter (Signed)
Pt says her blood pressure keep running up,not all the time.Please call to advise.

## 2014-03-30 NOTE — Telephone Encounter (Signed)
Pt had an elevated BP last week and had followed up about this w/ PCP. She has not had any issues since. She thinks it was related to stomach pain she was having which she's seen GI doc for this past week as well.  Advised to keep a check on BP & if elevated again to call us. Pt voiced understanding.

## 2014-04-02 ENCOUNTER — Encounter: Payer: Self-pay | Admitting: Internal Medicine

## 2014-04-02 ENCOUNTER — Telehealth: Payer: Self-pay | Admitting: Internal Medicine

## 2014-04-02 ENCOUNTER — Ambulatory Visit (INDEPENDENT_AMBULATORY_CARE_PROVIDER_SITE_OTHER): Payer: Federal, State, Local not specified - PPO | Admitting: Internal Medicine

## 2014-04-02 VITALS — BP 144/62 | HR 67 | Ht 64.0 in | Wt 95.0 lb

## 2014-04-02 DIAGNOSIS — R0989 Other specified symptoms and signs involving the circulatory and respiratory systems: Secondary | ICD-10-CM

## 2014-04-02 DIAGNOSIS — I10 Essential (primary) hypertension: Secondary | ICD-10-CM

## 2014-04-02 DIAGNOSIS — R55 Syncope and collapse: Secondary | ICD-10-CM

## 2014-04-02 DIAGNOSIS — I951 Orthostatic hypotension: Secondary | ICD-10-CM

## 2014-04-02 DIAGNOSIS — I491 Atrial premature depolarization: Secondary | ICD-10-CM

## 2014-04-02 MED ORDER — BENAZEPRIL HCL 20 MG PO TABS: 20.0000 mg | ORAL_TABLET | Freq: Every day | ORAL | Status: DC

## 2014-04-02 NOTE — Telephone Encounter (Signed)
Called mary mason back and LMTCB

## 2014-04-02 NOTE — Patient Instructions (Signed)
Please STOP Amlodipine-Benazepril   START taking Benazepril 20 mg, once a day  Your physician wants you to follow-up in 6 months with Dr. Rennis GoldenHilty. You will receive a reminder letter in the mail 2 months in advance. If you do not receive a letter, please call our office to schedule the follow-up appointment.

## 2014-04-05 DIAGNOSIS — R0989 Other specified symptoms and signs involving the circulatory and respiratory systems: Secondary | ICD-10-CM | POA: Insufficient documentation

## 2014-04-05 NOTE — Progress Notes (Signed)
Patient ID: Kimberly Avery, female   DOB: 23-Aug-1923, 81 y.o.   MRN: [id number]    OFFICE NOTE  Chief Complaint:  Feeling woozy  Primary Care Physician: Hollice Espy, MD  HPI:  Kimberly Avery is an 81 yo female with history of HTN, HLD, CKD stage 4 presenting for new patient evaluation.  Seen as a new patient for syncopal event 2 months ago. Notes she was walking outside around her property and was ambulating without any difficulty. Was going up a slight incline and the next thing she knows she was on the ground. She states she was laying flat when she noticed she was on the ground. Does not remember falling, though remembers feeling her head hit when she landed. Did not trip and states this was not a mechanical fall. Denies chest pain, shortness of breath, dizziness, palpitations, no light headedness prior to this event. She has never had a syncopal event before. No incontinence. No history of seizure. No post-ictal period. No more syncopal events. Tries to drink a lot of fluid, though was told she was drinking too much and now has been drinking less.  Notes some light headedness with getting up too quick. Does not occur too often and does ok if she gets up slowly. Daughter notes she stumbles and looses balance a lot. Notes an additional mechanical fall recently as well. No history of cardiac issues. No history of catheterization or stress test.   Notes she has HTN and has issues with this being up and down like a see-saw. Lowest blood pressure this week was 94/59 with a pulse of 59. Highest BP was 156/85 with pulse of 73.   Of note she has seen Dr Eden Emms in the past for her HTN. He noted that she had a normal echo in 05/2010. He also noted she had PACs and short bursts of PSVT that improved with addition of metoprolol. Was last seen in 05/2010.  Kimberly Avery returns today for followup. She reports no further syncopal episodes. Her blood pressure is actually quite elevated today  however recheck did come down to about 140/82. She continues to wear high compression stockings and has had no further syncopal episodes. She is having some problems with gas and bloating and is currently taking simethicone.  Again I saw Kimberly Avery in the office. I decreased some of her medications because of orthostatic hypotension, but she clearly has labile hypertension. She has since been restarted on a number of antihypertensive medications by her primary care provider. Unfortunately she tends to have syncope and a proclivity to orthostasis, especially on calcium channel blockers.  PMHx:  Past Medical History  Diagnosis Date  . Hypertension   . Hyperlipidemia   . Gout   . Osteoporosis   . CKD (chronic kidney disease) stage 4, GFR 15-29 ml/min   . Glaucoma   . Vitamin D deficiency     Past Surgical History  Procedure Laterality Date  . Breast surgery Bilateral 1970    mastectomies - fiboradenoma  . Eye surgery  2003, 2004  . Temporal artery biopsy / ligation  2001    FAMHx:  Family History  Problem Relation Age of Onset  . Heart attack Mother     MI while in hospital, RA, HTN  . Stroke Father     MI, CAD, alcoholism  . Cancer Brother     prostate cancer  . Heart Problems Sister     pacemaker, DM, RA, HTN    SOCHx:  reports that she has never smoked. She has never used smokeless tobacco. She reports that she does not drink alcohol or use illicit drugs.  ALLERGIES:  No Known Allergies  ROS: A comprehensive review of systems was negative except for: Constitutional: positive for [facility]  HOME MEDS: Current Outpatient Prescriptions  Medication Sig Dispense Refill  . alendronate (FOSAMAX) 70 MG tablet Take 1 tablet by mouth once a week.    . ALPRAZolam (XANAX) 0.5 MG tablet Take 0.25-0.5 mg by mouth at bedtime as needed for anxiety.    Marland Kitchen. amoxicillin (AMOXIL) 875 MG tablet Take 875 mg by mouth 2 (two) times daily.    Marland Kitchen. aspirin 81 MG tablet Take 81 mg by mouth daily.     . dorzolamide-timolol (COSOPT) 22.3-6.8 MG/ML ophthalmic solution Place 1 drop into both eyes 2 (two) times daily.    . indapamide (LOZOL) 2.5 MG tablet   0  . metoprolol tartrate (LOPRESSOR) 25 MG tablet Take 0.5 tablets (12.5 mg total) by mouth 2 (two) times daily. 90 tablet 1  . omeprazole (PRILOSEC) 20 MG capsule Take 20 mg by mouth daily.    Marland Kitchen. SIMETHICONE PO Take by mouth as needed.    . simvastatin (ZOCOR) 20 MG tablet Take 1 tablet by mouth daily.    . Vitamin D, Ergocalciferol, (DRISDOL) 50000 UNITS CAPS capsule Take 1 capsule by mouth every 14 (fourteen) days.    . benazepril (LOTENSIN) 20 MG tablet Take 1 tablet (20 mg total) by mouth daily. 30 tablet 11   No current facility-administered medications for this visit.    LABS/IMAGING: No results found for this or any previous visit (from the past 48 hour(s)). No results found.  VITALS: BP 144/62 mmHg  Pulse 67  Ht 5\' 4"  (1.626 m)  Wt 95 lb (43.092 kg)  BMI 16.30 kg/m2  EXAM: GEN: Awake, NAD HEENT: PERRLA. EOMI Lungs: Clear bilaterally Cardiovascular: Regular rate and rhythm, S1, S2, 2/6 systolic murmur at left lower sternal border, 1/6 diastolic murmur at apex Abdomen: Soft, nontender positive bowel sounds Extremities: No edema Neurologic: Grossly nonfocal Psych: Normal  EKG: deferred  ASSESSMENT: 1. Orthostatic Syncope 2. HTN- over treated at this time. With orthostatic hypotension and low blood pressures at home.  3. Hyperlipidemia- on simvastatin.  PLAN: 1.   Kimberly Avery has labile hypertension with blood pressures that vary from significantly elevated to very low causing orthostatic hypotension and prior syncope. In this situation, as recommended that we run her blood pressure much higher. Based on that, I have discontinued her Lotrel and will continue her on benazepril and metoprolol. Hopefully this will allow slightly higher blood pressure on average. I understand that she's had some intermittent high blood  pressures during recent office visits, but her blood pressures at home tend to run normal or low normal. A lot of this has to do with her age, frailty and low body weight. She also likely has low protein intake and would benefit from supplements. In addition, she could benefit from lower extremity compression stockings as I previously mentioned.  Plan to see her back in 6 months. I would not recommend further reactive adjustments to her blood pressure medications if she is found to be hypertensive in the office unless it is clearly demonstrated that she has more often than not hypertension at home.  Chrystie NoseKenneth C. Lorenzo Arscott, MD, [facility] Attending Cardiologist [facility]  Othella Slappey C 04/05/2014, 6:32 PM

## 2014-04-17 ENCOUNTER — Encounter (HOSPITAL_COMMUNITY): Payer: Self-pay | Admitting: Emergency Medicine

## 2014-04-17 ENCOUNTER — Inpatient Hospital Stay (HOSPITAL_COMMUNITY)
Admission: EM | Admit: 2014-04-17 | Discharge: 2014-04-24 | DRG: 853 | Disposition: A | Discharge status: Skilled Nursing Facility | Payer: Medicare Other | Attending: Internal Medicine | Admitting: Internal Medicine

## 2014-04-17 ENCOUNTER — Emergency Department (HOSPITAL_COMMUNITY): Payer: Medicare Other

## 2014-04-17 DIAGNOSIS — N183 Chronic kidney disease, stage 3 unspecified: Secondary | ICD-10-CM | POA: Diagnosis present

## 2014-04-17 DIAGNOSIS — D638 Anemia in other chronic diseases classified elsewhere: Secondary | ICD-10-CM | POA: Diagnosis present

## 2014-04-17 DIAGNOSIS — G9340 Encephalopathy, unspecified: Secondary | ICD-10-CM | POA: Diagnosis not present

## 2014-04-17 DIAGNOSIS — E559 Vitamin D deficiency, unspecified: Secondary | ICD-10-CM | POA: Diagnosis present

## 2014-04-17 DIAGNOSIS — I129 Hypertensive chronic kidney disease with stage 1 through stage 4 chronic kidney disease, or unspecified chronic kidney disease: Secondary | ICD-10-CM | POA: Diagnosis present

## 2014-04-17 DIAGNOSIS — A419 Sepsis, unspecified organism: Principal | ICD-10-CM | POA: Diagnosis present

## 2014-04-17 DIAGNOSIS — E876 Hypokalemia: Secondary | ICD-10-CM | POA: Diagnosis not present

## 2014-04-17 DIAGNOSIS — D62 Acute posthemorrhagic anemia: Secondary | ICD-10-CM | POA: Diagnosis not present

## 2014-04-17 DIAGNOSIS — Z809 Family history of malignant neoplasm, unspecified: Secondary | ICD-10-CM

## 2014-04-17 DIAGNOSIS — R7989 Other specified abnormal findings of blood chemistry: Secondary | ICD-10-CM

## 2014-04-17 DIAGNOSIS — R9431 Abnormal electrocardiogram [ECG] [EKG]: Secondary | ICD-10-CM

## 2014-04-17 DIAGNOSIS — R109 Unspecified abdominal pain: Secondary | ICD-10-CM

## 2014-04-17 DIAGNOSIS — Z419 Encounter for procedure for purposes other than remedying health state, unspecified: Secondary | ICD-10-CM

## 2014-04-17 DIAGNOSIS — Z823 Family history of stroke: Secondary | ICD-10-CM | POA: Diagnosis not present

## 2014-04-17 DIAGNOSIS — N179 Acute kidney failure, unspecified: Secondary | ICD-10-CM

## 2014-04-17 DIAGNOSIS — Z833 Family history of diabetes mellitus: Secondary | ICD-10-CM

## 2014-04-17 DIAGNOSIS — E861 Hypovolemia: Secondary | ICD-10-CM | POA: Diagnosis present

## 2014-04-17 DIAGNOSIS — D649 Anemia, unspecified: Secondary | ICD-10-CM | POA: Diagnosis present

## 2014-04-17 DIAGNOSIS — E871 Hypo-osmolality and hyponatremia: Secondary | ICD-10-CM

## 2014-04-17 DIAGNOSIS — S7221XA Displaced subtrochanteric fracture of right femur, initial encounter for closed fracture: Secondary | ICD-10-CM

## 2014-04-17 DIAGNOSIS — M810 Age-related osteoporosis without current pathological fracture: Secondary | ICD-10-CM | POA: Diagnosis present

## 2014-04-17 DIAGNOSIS — Y92019 Unspecified place in single-family (private) house as the place of occurrence of the external cause: Secondary | ICD-10-CM

## 2014-04-17 DIAGNOSIS — N12 Tubulo-interstitial nephritis, not specified as acute or chronic: Secondary | ICD-10-CM | POA: Diagnosis present

## 2014-04-17 DIAGNOSIS — W19XXXA Unspecified fall, initial encounter: Secondary | ICD-10-CM | POA: Diagnosis present

## 2014-04-17 DIAGNOSIS — F0390 Unspecified dementia without behavioral disturbance: Secondary | ICD-10-CM | POA: Diagnosis present

## 2014-04-17 DIAGNOSIS — Z79899 Other long term (current) drug therapy: Secondary | ICD-10-CM | POA: Diagnosis not present

## 2014-04-17 DIAGNOSIS — E872 Acidosis, unspecified: Secondary | ICD-10-CM | POA: Diagnosis present

## 2014-04-17 DIAGNOSIS — E785 Hyperlipidemia, unspecified: Secondary | ICD-10-CM | POA: Diagnosis present

## 2014-04-17 DIAGNOSIS — E860 Dehydration: Secondary | ICD-10-CM | POA: Diagnosis present

## 2014-04-17 DIAGNOSIS — Z7982 Long term (current) use of aspirin: Secondary | ICD-10-CM

## 2014-04-17 DIAGNOSIS — M109 Gout, unspecified: Secondary | ICD-10-CM | POA: Diagnosis present

## 2014-04-17 DIAGNOSIS — H409 Unspecified glaucoma: Secondary | ICD-10-CM | POA: Diagnosis present

## 2014-04-17 DIAGNOSIS — R531 Weakness: Secondary | ICD-10-CM | POA: Diagnosis present

## 2014-04-17 DIAGNOSIS — N184 Chronic kidney disease, stage 4 (severe): Secondary | ICD-10-CM | POA: Diagnosis present

## 2014-04-17 DIAGNOSIS — S72141D Displaced intertrochanteric fracture of right femur, subsequent encounter for closed fracture with routine healing: Secondary | ICD-10-CM | POA: Diagnosis not present

## 2014-04-17 DIAGNOSIS — R778 Other specified abnormalities of plasma proteins: Secondary | ICD-10-CM | POA: Clinically undetermined

## 2014-04-17 DIAGNOSIS — N10 Acute tubulo-interstitial nephritis: Secondary | ICD-10-CM | POA: Diagnosis not present

## 2014-04-17 DIAGNOSIS — K219 Gastro-esophageal reflux disease without esophagitis: Secondary | ICD-10-CM | POA: Diagnosis present

## 2014-04-17 DIAGNOSIS — E43 Unspecified severe protein-calorie malnutrition: Secondary | ICD-10-CM | POA: Diagnosis present

## 2014-04-17 DIAGNOSIS — I248 Other forms of acute ischemic heart disease: Secondary | ICD-10-CM | POA: Diagnosis present

## 2014-04-17 DIAGNOSIS — R4182 Altered mental status, unspecified: Secondary | ICD-10-CM

## 2014-04-17 DIAGNOSIS — D696 Thrombocytopenia, unspecified: Secondary | ICD-10-CM | POA: Diagnosis present

## 2014-04-17 DIAGNOSIS — K529 Noninfective gastroenteritis and colitis, unspecified: Secondary | ICD-10-CM | POA: Diagnosis present

## 2014-04-17 DIAGNOSIS — S72141A Displaced intertrochanteric fracture of right femur, initial encounter for closed fracture: Secondary | ICD-10-CM | POA: Diagnosis present

## 2014-04-17 DIAGNOSIS — S72141P Displaced intertrochanteric fracture of right femur, subsequent encounter for closed fracture with malunion: Secondary | ICD-10-CM

## 2014-04-17 DIAGNOSIS — Z681 Body mass index (BMI) 19 or less, adult: Secondary | ICD-10-CM

## 2014-04-17 DIAGNOSIS — S7291XA Unspecified fracture of right femur, initial encounter for closed fracture: Secondary | ICD-10-CM

## 2014-04-17 DIAGNOSIS — Z8249 Family history of ischemic heart disease and other diseases of the circulatory system: Secondary | ICD-10-CM | POA: Diagnosis not present

## 2014-04-17 DIAGNOSIS — I350 Nonrheumatic aortic (valve) stenosis: Secondary | ICD-10-CM | POA: Diagnosis not present

## 2014-04-17 DIAGNOSIS — S7223XA Displaced subtrochanteric fracture of unspecified femur, initial encounter for closed fracture: Secondary | ICD-10-CM | POA: Diagnosis present

## 2014-04-17 DIAGNOSIS — M25559 Pain in unspecified hip: Secondary | ICD-10-CM

## 2014-04-17 LAB — RAPID URINE DRUG SCREEN, HOSP PERFORMED
AMPHETAMINES: NOT DETECTED
BENZODIAZEPINES: NOT DETECTED
Barbiturates: NOT DETECTED
Cocaine: NOT DETECTED
OPIATES: NOT DETECTED
TETRAHYDROCANNABINOL: NOT DETECTED

## 2014-04-17 LAB — CBG MONITORING, ED: Glucose-Capillary: 214 mg/dL — ABNORMAL HIGH (ref 70–99)

## 2014-04-17 LAB — BLOOD GAS, ARTERIAL
Acid-base deficit: 7.3 mmol/L — ABNORMAL HIGH (ref 0.0–2.0)
Bicarbonate: 15.9 mEq/L — ABNORMAL LOW (ref 20.0–24.0)
Drawn by: 232811
O2 Content: 2.5 L/min
O2 Saturation: 98.4 %
Patient temperature: 97.4
TCO2: 15 mmol/L (ref 0–100)
pCO2 arterial: 24.5 mmHg — ABNORMAL LOW (ref 35.0–45.0)
pH, Arterial: 7.425 (ref 7.350–7.450)
pO2, Arterial: 141 mmHg — ABNORMAL HIGH (ref 80.0–100.0)

## 2014-04-17 LAB — CBC
HCT: 22.5 % — ABNORMAL LOW (ref 36.0–46.0)
HEMOGLOBIN: 8.3 g/dL — AB (ref 12.0–15.0)
MCH: 28.5 pg (ref 26.0–34.0)
MCHC: 36.9 g/dL — ABNORMAL HIGH (ref 30.0–36.0)
MCV: 77.3 fL — AB (ref 78.0–100.0)
PLATELETS: 110 10*3/uL — AB (ref 150–400)
RBC: 2.91 MIL/uL — AB (ref 3.87–5.11)
RDW: 12.4 % (ref 11.5–15.5)
WBC: 13.3 10*3/uL — ABNORMAL HIGH (ref 4.0–10.5)

## 2014-04-17 LAB — MAGNESIUM
Magnesium: 1.9 mg/dL (ref 1.5–2.5)
Magnesium: 2.1 mg/dL (ref 1.5–2.5)

## 2014-04-17 LAB — BASIC METABOLIC PANEL
ANION GAP: 14 (ref 5–15)
Anion gap: 11 (ref 5–15)
Anion gap: 12 (ref 5–15)
Anion gap: 8 (ref 5–15)
BUN: 29 mg/dL — ABNORMAL HIGH (ref 6–23)
BUN: 30 mg/dL — ABNORMAL HIGH (ref 6–23)
BUN: 31 mg/dL — ABNORMAL HIGH (ref 6–23)
BUN: 33 mg/dL — AB (ref 6–23)
CALCIUM: 6.8 mg/dL — AB (ref 8.4–10.5)
CHLORIDE: 80 mmol/L — AB (ref 96–112)
CHLORIDE: 80 mmol/L — AB (ref 96–112)
CHLORIDE: 84 mmol/L — AB (ref 96–112)
CHLORIDE: 86 mmol/L — AB (ref 96–112)
CO2: 16 mmol/L — ABNORMAL LOW (ref 19–32)
CO2: 19 mmol/L (ref 19–32)
CO2: 16 mmol/L — ABNORMAL LOW (ref 19–32)
CO2: 18 mmol/L — AB (ref 19–32)
CREATININE: 1.51 mg/dL — AB (ref 0.50–1.10)
CREATININE: 1.45 mg/dL — AB (ref 0.50–1.10)
Calcium: 6.6 mg/dL — ABNORMAL LOW (ref 8.4–10.5)
Calcium: 6.7 mg/dL — ABNORMAL LOW (ref 8.4–10.5)
Calcium: 6.7 mg/dL — ABNORMAL LOW (ref 8.4–10.5)
Creatinine, Ser: 1.54 mg/dL — ABNORMAL HIGH (ref 0.50–1.10)
Creatinine, Ser: 1.52 mg/dL — ABNORMAL HIGH (ref 0.50–1.10)
GFR calc Af Amer: 34 mL/min — ABNORMAL LOW (ref >90)
GFR calc Af Amer: 34 mL/min — ABNORMAL LOW (ref >90)
GFR calc Af Amer: 36 mL/min — ABNORMAL LOW (ref >90)
GFR calc non Af Amer: 29 mL/min — ABNORMAL LOW (ref >90)
GFR calc non Af Amer: 29 mL/min — ABNORMAL LOW (ref >90)
GFR calc non Af Amer: 29 mL/min — ABNORMAL LOW (ref >90)
GFR calc non Af Amer: 31 mL/min — ABNORMAL LOW (ref >90)
GFR, EST AFRICAN AMERICAN: 33 mL/min — AB (ref >90)
GLUCOSE: 82 mg/dL (ref 70–99)
GLUCOSE: 104 mg/dL — AB (ref 70–99)
Glucose, Bld: 130 mg/dL — ABNORMAL HIGH (ref 70–99)
Glucose, Bld: 122 mg/dL — ABNORMAL HIGH (ref 70–99)
POTASSIUM: 3.4 mmol/L — AB (ref 3.5–5.1)
Potassium: 3.1 mmol/L — ABNORMAL LOW (ref 3.5–5.1)
Potassium: 4.1 mmol/L (ref 3.5–5.1)
Potassium: 3.7 mmol/L (ref 3.5–5.1)
SODIUM: 110 mmol/L — AB (ref 135–145)
SODIUM: 112 mmol/L — AB (ref 135–145)
SODIUM: 112 mmol/L — AB (ref 135–145)
Sodium: 110 mmol/L — CL (ref 135–145)

## 2014-04-17 LAB — I-STAT CHEM 8, ED
BUN: 27 mg/dL — AB (ref 6–23)
BUN: 29 mg/dL — ABNORMAL HIGH (ref 6–23)
CALCIUM ION: 1 mmol/L — AB (ref 1.13–1.30)
CREATININE: 1.7 mg/dL — AB (ref 0.50–1.10)
Calcium, Ion: 0.86 mmol/L — ABNORMAL LOW (ref 1.13–1.30)
Chloride: 72 mmol/L — ABNORMAL LOW (ref 96–112)
Chloride: 73 mmol/L — ABNORMAL LOW (ref 96–112)
Creatinine, Ser: 1.6 mg/dL — ABNORMAL HIGH (ref 0.50–1.10)
Glucose, Bld: 233 mg/dL — ABNORMAL HIGH (ref 70–99)
Glucose, Bld: 207 mg/dL — ABNORMAL HIGH (ref 70–99)
HEMATOCRIT: 39 % (ref 36.0–46.0)
HEMATOCRIT: 23 % — AB (ref 36.0–46.0)
Hemoglobin: 13.3 g/dL (ref 12.0–15.0)
Hemoglobin: 7.8 g/dL — ABNORMAL LOW (ref 12.0–15.0)
Potassium: 2.9 mmol/L — ABNORMAL LOW (ref 3.5–5.1)
Potassium: 3.1 mmol/L — ABNORMAL LOW (ref 3.5–5.1)
SODIUM: 104 mmol/L — AB (ref 135–145)
Sodium: 106 mmol/L — CL (ref 135–145)
TCO2: 13 mmol/L (ref 0–100)
TCO2: 15 mmol/L (ref 0–100)

## 2014-04-17 LAB — LACTIC ACID, PLASMA
LACTIC ACID, VENOUS: 2.3 mmol/L — AB (ref 0.5–2.0)
Lactic Acid, Venous: 3.7 mmol/L (ref 0.5–2.0)

## 2014-04-17 LAB — TSH: TSH: 1.095 u[IU]/mL (ref 0.350–4.500)

## 2014-04-17 LAB — COMPREHENSIVE METABOLIC PANEL
ALBUMIN: 3.7 g/dL (ref 3.5–5.2)
ALT: 38 U/L — AB (ref 0–35)
ANION GAP: 19 — AB (ref 5–15)
AST: 125 U/L — ABNORMAL HIGH (ref 0–37)
Alkaline Phosphatase: 63 U/L (ref 39–117)
BUN: 30 mg/dL — ABNORMAL HIGH (ref 6–23)
CO2: 18 mmol/L — AB (ref 19–32)
Calcium: 7.9 mg/dL — ABNORMAL LOW (ref 8.4–10.5)
Chloride: 70 mmol/L — ABNORMAL LOW (ref 96–112)
Creatinine, Ser: 1.64 mg/dL — ABNORMAL HIGH (ref 0.50–1.10)
GFR calc Af Amer: 31 mL/min — ABNORMAL LOW (ref >90)
GFR, EST NON AFRICAN AMERICAN: 27 mL/min — AB (ref >90)
Glucose, Bld: 233 mg/dL — ABNORMAL HIGH (ref 70–99)
POTASSIUM: 2.8 mmol/L — AB (ref 3.5–5.1)
SODIUM: 107 mmol/L — AB (ref 135–145)
TOTAL PROTEIN: 6.6 g/dL (ref 6.0–8.3)
Total Bilirubin: 1 mg/dL (ref 0.3–1.2)

## 2014-04-17 LAB — URINALYSIS, ROUTINE W REFLEX MICROSCOPIC
Bilirubin Urine: NEGATIVE
GLUCOSE, UA: NEGATIVE mg/dL
Ketones, ur: NEGATIVE mg/dL
Leukocytes, UA: NEGATIVE
NITRITE: NEGATIVE
Protein, ur: 100 mg/dL — AB
Specific Gravity, Urine: 1.008 (ref 1.005–1.030)
Urobilinogen, UA: 0.2 mg/dL (ref 0.0–1.0)
pH: 7 (ref 5.0–8.0)

## 2014-04-17 LAB — CBC WITH DIFFERENTIAL/PLATELET
BASOS ABS: 0 10*3/uL (ref 0.0–0.1)
Basophils Relative: 0 % (ref 0–1)
Eosinophils Absolute: 0 10*3/uL (ref 0.0–0.7)
Eosinophils Relative: 0 % (ref 0–5)
HCT: 31.3 % — ABNORMAL LOW (ref 36.0–46.0)
Hemoglobin: 11.2 g/dL — ABNORMAL LOW (ref 12.0–15.0)
LYMPHS ABS: 0.9 10*3/uL (ref 0.7–4.0)
LYMPHS PCT: 11 % — AB (ref 12–46)
MCH: 28 pg (ref 26.0–34.0)
MCHC: 35.8 g/dL (ref 30.0–36.0)
MCV: 78.3 fL (ref 78.0–100.0)
MONOS PCT: 5 % (ref 3–12)
Monocytes Absolute: 0.5 10*3/uL (ref 0.1–1.0)
NEUTROS ABS: 7.2 10*3/uL (ref 1.7–7.7)
NEUTROS PCT: 84 % — AB (ref 43–77)
PLATELETS: 143 10*3/uL — AB (ref 150–400)
RBC: 4 MIL/uL (ref 3.87–5.11)
RDW: 12.3 % (ref 11.5–15.5)
WBC: 8.5 10*3/uL (ref 4.0–10.5)

## 2014-04-17 LAB — I-STAT CG4 LACTIC ACID, ED
LACTIC ACID, VENOUS: 7.08 mmol/L — AB (ref 0.5–2.0)
Lactic Acid, Venous: 5.7 mmol/L (ref 0.5–2.0)

## 2014-04-17 LAB — URINE MICROSCOPIC-ADD ON

## 2014-04-17 LAB — PHOSPHORUS: Phosphorus: 3.3 mg/dL (ref 2.3–4.6)

## 2014-04-17 LAB — I-STAT TROPONIN, ED: TROPONIN I, POC: 1.25 ng/mL — AB (ref 0.00–0.08)

## 2014-04-17 LAB — PROTIME-INR
INR: 1.08 (ref 0.00–1.49)
Prothrombin Time: 14.2 seconds (ref 11.6–15.2)

## 2014-04-17 LAB — LIPASE, BLOOD: LIPASE: 42 U/L (ref 11–59)

## 2014-04-17 LAB — ETHANOL: Alcohol, Ethyl (B): <5 mg/dL (ref 0–9)

## 2014-04-17 LAB — GLUCOSE, CAPILLARY
Glucose-Capillary: 130 mg/dL — ABNORMAL HIGH (ref 70–99)
Glucose-Capillary: 105 mg/dL — ABNORMAL HIGH (ref 70–99)
Glucose-Capillary: 120 mg/dL — ABNORMAL HIGH (ref 70–99)

## 2014-04-17 LAB — MRSA PCR SCREENING: MRSA by PCR: NEGATIVE

## 2014-04-17 LAB — OSMOLALITY: OSMOLALITY: 237 mosm/kg — AB (ref 275–300)

## 2014-04-17 LAB — OSMOLALITY, URINE: Osmolality, Ur: 283 mOsm/kg — ABNORMAL LOW (ref 390–1090)

## 2014-04-17 MED ORDER — POTASSIUM CHLORIDE 10 MEQ/100ML IV SOLN
INTRAVENOUS | Status: AC
  Filled 2014-04-17: qty 100

## 2014-04-17 MED ORDER — VANCOMYCIN HCL IN DEXTROSE 1-5 GM/200ML-% IV SOLN
1000.0000 mg | Freq: Once | INTRAVENOUS | Status: AC
  Administered 2014-04-17: 1000 mg via INTRAVENOUS
  Filled 2014-04-17: qty 200

## 2014-04-17 MED ORDER — IOHEXOL 300 MG/ML  SOLN
50.0000 mL | Freq: Once | INTRAMUSCULAR | Status: AC | PRN
  Administered 2014-04-17: 50 mL via ORAL

## 2014-04-17 MED ORDER — DEXTROSE 5 % IV SOLN
1.0000 g | INTRAVENOUS | Status: AC
  Administered 2014-04-17 – 2014-04-21 (×5): 1 g via INTRAVENOUS
  Filled 2014-04-17 (×5): qty 10

## 2014-04-17 MED ORDER — ASPIRIN 81 MG PO CHEW: 81.0000 mg | CHEWABLE_TABLET | Freq: Every day | ORAL | Status: DC

## 2014-04-17 MED ORDER — FENTANYL CITRATE 0.05 MG/ML IJ SOLN
25.0000 ug | INTRAMUSCULAR | Status: DC | PRN
Start: 2014-04-17 — End: 2014-04-21
  Administered 2014-04-19: 100 ug via INTRAVENOUS
  Administered 2014-04-19 (×2): 50 ug via INTRAVENOUS
  Administered 2014-04-19: 25 ug via INTRAVENOUS
  Administered 2014-04-19: 50 ug via INTRAVENOUS
  Administered 2014-04-20: 100 ug via INTRAVENOUS
  Administered 2014-04-20 (×2): 50 ug via INTRAVENOUS
  Filled 2014-04-17 (×9): qty 2

## 2014-04-17 MED ORDER — ONDANSETRON HCL 4 MG/2ML IJ SOLN: 4.0000 mg | Freq: Four times a day (QID) | INTRAMUSCULAR | Status: DC | PRN

## 2014-04-17 MED ORDER — SODIUM CHLORIDE 0.9 % IV SOLN: 250.0000 mL | INTRAVENOUS | Status: DC | PRN

## 2014-04-17 MED ORDER — HALOPERIDOL LACTATE 5 MG/ML IJ SOLN: 5.0000 mg | Freq: Four times a day (QID) | INTRAMUSCULAR | Status: DC | PRN

## 2014-04-17 MED ORDER — POTASSIUM CHLORIDE CRYS ER 20 MEQ PO TBCR: 40.0000 meq | EXTENDED_RELEASE_TABLET | Freq: Once | ORAL | Status: DC

## 2014-04-17 MED ORDER — POTASSIUM CHLORIDE 10 MEQ/100ML IV SOLN
10.0000 meq | INTRAVENOUS | Status: AC
  Administered 2014-04-17 (×4): 10 meq via INTRAVENOUS
  Filled 2014-04-17 (×3): qty 100

## 2014-04-17 MED ORDER — DORZOLAMIDE HCL-TIMOLOL MAL 2-0.5 % OP SOLN
1.0000 [drp] | Freq: Two times a day (BID) | OPHTHALMIC | Status: DC
  Administered 2014-04-17 – 2014-04-24 (×15): 1 [drp] via OPHTHALMIC
  Filled 2014-04-17 (×2): qty 10

## 2014-04-17 MED ORDER — MORPHINE SULFATE 4 MG/ML IJ SOLN
4.0000 mg | Freq: Once | INTRAMUSCULAR | Status: AC
  Administered 2014-04-17: 4 mg via INTRAVENOUS
  Filled 2014-04-17: qty 1

## 2014-04-17 MED ORDER — SODIUM CHLORIDE 0.9 % IV BOLUS (SEPSIS)
1000.0000 mL | Freq: Once | INTRAVENOUS | Status: AC
  Administered 2014-04-17: 1000 mL via INTRAVENOUS

## 2014-04-17 MED ORDER — METOPROLOL TARTRATE 25 MG PO TABS
12.5000 mg | ORAL_TABLET | Freq: Two times a day (BID) | ORAL | Status: DC
  Administered 2014-04-18 – 2014-04-24 (×12): 12.5 mg via ORAL
  Filled 2014-04-17 (×14): qty 1

## 2014-04-17 MED ORDER — HEPARIN SODIUM (PORCINE) 5000 UNIT/ML IJ SOLN: 5000.0000 [IU] | Freq: Three times a day (TID) | INTRAMUSCULAR | Status: DC

## 2014-04-17 MED ORDER — PANTOPRAZOLE SODIUM 40 MG PO TBEC
40.0000 mg | DELAYED_RELEASE_TABLET | Freq: Every day | ORAL | Status: DC
  Administered 2014-04-17 – 2014-04-24 (×8): 40 mg via ORAL
  Filled 2014-04-17 (×8): qty 1

## 2014-04-17 MED ORDER — HYDROMORPHONE HCL 1 MG/ML IJ SOLN: 0.5000 mg | INTRAMUSCULAR | Status: DC | PRN

## 2014-04-17 MED ORDER — FAMOTIDINE 20 MG PO TABS
20.0000 mg | ORAL_TABLET | Freq: Two times a day (BID) | ORAL | Status: DC
  Administered 2014-04-17: 20 mg via ORAL
  Filled 2014-04-17: qty 1

## 2014-04-17 MED ORDER — PIPERACILLIN-TAZOBACTAM 3.375 G IVPB 30 MIN
3.3750 g | Freq: Once | INTRAVENOUS | Status: AC
  Administered 2014-04-17: 3.375 g via INTRAVENOUS
  Filled 2014-04-17: qty 50

## 2014-04-17 MED ORDER — MAGNESIUM SULFATE 2 GM/50ML IV SOLN
2.0000 g | Freq: Once | INTRAVENOUS | Status: AC
  Administered 2014-04-17: 2 g via INTRAVENOUS
  Filled 2014-04-17: qty 50

## 2014-04-17 MED ORDER — POTASSIUM CHLORIDE 10 MEQ/100ML IV SOLN
10.0000 meq | INTRAVENOUS | Status: AC
  Administered 2014-04-17 (×2): 10 meq via INTRAVENOUS
  Filled 2014-04-17 (×2): qty 100

## 2014-04-17 MED ORDER — INSULIN ASPART 100 UNIT/ML ~~LOC~~ SOLN
2.0000 [IU] | SUBCUTANEOUS | Status: DC
  Administered 2014-04-17: 2 [IU] via SUBCUTANEOUS
  Filled 2014-04-17: qty 1

## 2014-04-17 MED ORDER — TRAMADOL HCL 50 MG PO TABS
50.0000 mg | ORAL_TABLET | Freq: Four times a day (QID) | ORAL | Status: DC | PRN
  Administered 2014-04-17 – 2014-04-23 (×12): 50 mg via ORAL
  Filled 2014-04-17 (×13): qty 1

## 2014-04-17 MED ORDER — SODIUM CHLORIDE 0.9 % IV SOLN
INTRAVENOUS | Status: DC
  Administered 2014-04-17 – 2014-04-22 (×7): via INTRAVENOUS

## 2014-04-17 MED ORDER — SIMVASTATIN 20 MG PO TABS
20.0000 mg | ORAL_TABLET | Freq: Every day | ORAL | Status: DC
  Administered 2014-04-17 – 2014-04-23 (×7): 20 mg via ORAL
  Filled 2014-04-17: qty 1
  Filled 2014-04-17: qty 2
  Filled 2014-04-17 (×3): qty 1
  Filled 2014-04-17 (×2): qty 2

## 2014-04-17 MED ORDER — HEPARIN SODIUM (PORCINE) 5000 UNIT/ML IJ SOLN
5000.0000 [IU] | Freq: Two times a day (BID) | INTRAMUSCULAR | Status: DC
  Filled 2014-04-17 (×2): qty 1

## 2014-04-17 MED ORDER — HYDRALAZINE HCL 20 MG/ML IJ SOLN
10.0000 mg | INTRAMUSCULAR | Status: DC | PRN
  Administered 2014-04-19: 10 mg via INTRAVENOUS
  Filled 2014-04-17: qty 1

## 2014-04-17 MED ORDER — ACETAMINOPHEN 325 MG PO TABS: 650.0000 mg | ORAL_TABLET | ORAL | Status: DC | PRN

## 2014-04-17 NOTE — ED Notes (Addendum)
Patient's family member reports that patient was last seen normal at noon on Friday 04/16/2014

## 2014-04-17 NOTE — ED Notes (Signed)
Pt's family member reports increased disorientation starting yesterday. Last time seeing patient at baseline was Thursday night. At baseline-patient is A&Ox4, walks without assistance. Patient is not verbal at this time. Not able to walk at this time. Right hip and leg appear to be outwardly rotated. Emesis around 1500 today x1. Possibly fell yesterday-family is unsure.

## 2014-04-17 NOTE — ED Notes (Signed)
RT at bedside.

## 2014-04-17 NOTE — ED Notes (Signed)
Patient in radiology

## 2014-04-17 NOTE — H&P (Signed)
PULMONARY / CRITICAL CARE MEDICINE   Name: Kimberly Avery MRN: [id number] DOB: 1923-04-21    ADMISSION DATE:  04/17/2014 CONSULTATION DATE:  04/17/14  REFERRING MD :  ED  CHIEF COMPLAINT:  weakness  INITIAL PRESENTATION:   STUDIES:  CT abd 04/17/14: 1. Findings suspicious for pyelonephritis of the upper left kidney. 2. Comminuted mildly displaced right proximal femur fracture, primarily intertrochanteric with the subtrochanteric component. Associated hemorrhage, some of which tracks in the distal iliopsoas muscle.  SIGNIFICANT EVENTS:    HISTORY OF PRESENT ILLNESS:  This is a 40F who lives alone, apparently was doing well until yesterday morning when she called her daughter and saying that she didn't feel well. Had chronic diarrhea but that morning also had some greenish vomiting and abd discomfort. No witnessed fall, no witnessed seizure. Due to her progressive weakness and lethargy she was brough to the ED and found to have severe hyponatremia, renal failure, right femoral fracture and high lactate. She is more confused than usual. She is still compliant to her meds. No smoking. No fever, no chills, no chest pain, no productive cough, no hemoptysis, no leg swelling, no leg tenderness, no vision changes.  PAST MEDICAL HISTORY :   has a past medical history of Hypertension; Hyperlipidemia; Gout; Osteoporosis; CKD (chronic kidney disease) stage 4, GFR 15-29 ml/min; Glaucoma; and Vitamin D deficiency.  has past surgical history that includes Breast surgery (Bilateral, 1970); Eye surgery (2003, 2004); and Temporal artery biopsy / ligation (2001). Prior to Admission medications   Medication Sig Start Date End Date Taking? Authorizing Provider  alendronate (FOSAMAX) 70 MG tablet Take 1 tablet by mouth once a week. 08/31/13  Yes Historical Provider, MD  ALPRAZolam Prudy Feeler) 0.5 MG tablet Take 0.25-0.5 mg by mouth at bedtime as needed for anxiety.   Yes Historical Provider, MD  aspirin 81 MG  tablet Take 81 mg by mouth daily.   Yes Historical Provider, MD  benazepril (LOTENSIN) 20 MG tablet Take 1 tablet (20 mg total) by mouth daily. 04/02/14  Yes Chrystie Nose, MD  dorzolamide-timolol (COSOPT) 22.3-6.8 MG/ML ophthalmic solution Place 1 drop into both eyes 2 (two) times daily. 09/19/13  Yes Historical Provider, MD  indapamide (LOZOL) 2.5 MG tablet Take 2.5 mg by mouth daily.  02/15/14  Yes Historical Provider, MD  metoprolol tartrate (LOPRESSOR) 25 MG tablet Take 0.5 tablets (12.5 mg total) by mouth 2 (two) times daily. Patient taking differently: Take 25 mg by mouth 2 (two) times daily.  10/29/13  Yes Chrystie Nose, MD  simvastatin (ZOCOR) 20 MG tablet Take 1 tablet by mouth daily. 08/27/13  Yes Historical Provider, MD  Vitamin D, Ergocalciferol, (DRISDOL) 50000 UNITS CAPS capsule Take 1 capsule by mouth every 14 (fourteen) days. 09/17/13  Yes Historical Provider, MD  omeprazole (PRILOSEC) 20 MG capsule Take 20 mg by mouth daily.    Historical Provider, MD  SIMETHICONE PO Take 1 tablet by mouth daily as needed.     Historical Provider, MD   No Known Allergies  FAMILY HISTORY:  indicated that her mother is deceased. She indicated that her father is deceased. She indicated that her sister is deceased. She indicated that her brother is deceased. She indicated that her maternal grandmother is deceased. She indicated that her maternal grandfather is deceased. She indicated that her paternal grandmother is deceased. She indicated that her paternal grandfather is deceased.  SOCIAL HISTORY:  reports that she has never smoked. She has never used smokeless tobacco. She reports that she  does not drink alcohol or use illicit drugs.  REVIEW OF SYSTEMS:  Difficult to obtain due to confusion but all were negative other than what were noted in HPI.  SUBJECTIVE:   VITAL SIGNS: Temp:  [98.5 F (36.9 C)] 98.5 F (36.9 C) (03/26 0147) Pulse Rate:  [59-85] 75 (03/26 0518) Resp:  [16-25] 20 (03/26  0518) BP: (96-152)/(57-99) 96/78 mmHg (03/26 0518) SpO2:  [88 %-100 %] 100 % (03/26 0518) HEMODYNAMICS:   VENTILATOR SETTINGS:   INTAKE / OUTPUT:  Intake/Output Summary (Last 24 hours) at 04/17/14 0533 Last data filed at 04/17/14 0520  Gross per 24 hour  Intake      0 ml  Output    400 ml  Net   -400 ml    PHYSICAL EXAMINATION: General:  Drowsy but easily arousable, no distress at rest Neuro:  No focal weakness, no facial droop, tongue midline HEENT:  Atraumatic, no stridor, EOM full and equal Cardiovascular:  RRR, no loud murmur Lungs:  Clear, no wheeze Abdomen:  Soft, nontender Musculoskeletal:  Right leg is externally rotated, no leg edema Skin:  No ecchymosis  LABS:  CBC  Recent Labs Lab 04/17/14 0146 04/17/14 0152 04/17/14 0417  WBC 8.5  --   --   HGB 11.2* 13.3 7.8*  HCT 31.3* 39.0 23.0*  PLT 143*  --   --    Coag's  Recent Labs Lab 04/17/14 0146  INR 1.08   BMET  Recent Labs Lab 04/17/14 0146 04/17/14 0152 04/17/14 0417  NA 107* 104* 106*  K 2.8* 2.9* 3.1*  CL 70* 72* 73*  CO2 18*  --   --   BUN 30* 27* 29*  CREATININE 1.64* 1.60* 1.70*  GLUCOSE 233* 233* 207*   Electrolytes  Recent Labs Lab 04/17/14 0146  CALCIUM 7.9*  MG 1.9   Sepsis Markers  Recent Labs Lab 04/17/14 0155 04/17/14 0414  LATICACIDVEN 7.08* 5.70*   ABG  Recent Labs Lab 04/17/14 0435  PHART 7.425  PCO2ART 24.5*  PO2ART 141.0*   Liver Enzymes  Recent Labs Lab 04/17/14 0146  AST 125*  ALT 38*  ALKPHOS 63  BILITOT 1.0  ALBUMIN 3.7   Cardiac Enzymes No results for input(s): TROPONINI, PROBNP in the last 168 hours. Glucose  Recent Labs Lab 04/17/14 0152  GLUCAP 214*    Imaging No results found.   ASSESSMENT / PLAN:  PULMONARY OETT A: no acute issues P:   Monitor. Incentive spirometry  CARDIOVASCULAR CVL A: HTN, HLD P:  Resume metoprolol. PRN hydralazine. Resume statin Obviously stop diuretics, stop ASA and no heparin for  now due to the femoral fracture, use SCD Ortho consult for the fracture, pain control PRN  RENAL A:  Renal failure, unclear how much of it is acute Hyponatremia from diuretic use P:   IVF resuscitation, monitor electrolytes. Avoid nephrotoxic meds, stop ACE-I for now until we know whaqt her baseline is. BMP Q4, avoid overcorrection, goal Na correction 8-10 in 24 hours, will use NS for now. Check serum and urine Osm to rule out SIADH. Free water restriction 1500 mL/day  GASTROINTESTINAL A:  Chronic diarrhea. Has some vomiting today P:   Vomiting may be related to the sodium level. PO intake as tolerated  HEMATOLOGIC A:  Questionable acute anemia, possibly dilutional? P:  Repeat CBC now  INFECTIOUS A:  Possible early pyelonephritis without pyuria P:   BCx2 pending UC pending Sputum Abx: rocephine 04/17/14 >>>  ENDOCRINE A:  History of DM P:  Goal blood sugar 140-180  NEUROLOGIC A:  Mild delirium and lethargy, likely related to the severe hyponatremia. May be additional contribution from the fracture and early pyelonephritis P:   Avoid meds that can cause delirium Haldol PRN RASS goal: 0 to +1    FAMILY  - Updates:   - Inter-disciplinary family meet or Palliative Care meeting due by:      TODAY'S SUMMARY: Admitted for weakness, found to have severe hyponatremia likely from diuretic use. Also early pyelonephritis without pyuria. Has renal failure and right femoral fracture.   Critical care time: 35 minutes   Pulmonary and Critical Care Medicine [facility] Pager: [phone]  04/17/2014, 5:33 AM

## 2014-04-17 NOTE — ED Notes (Signed)
Patient back from radiology.

## 2014-04-17 NOTE — ED Notes (Signed)
Notified EDP, Oni,MD., pt. i-stat Chem 8 potassium 2.9. Pt. i-stat Lactic acid CG4 results 7.08 and i-stat troponin results 1.25.

## 2014-04-17 NOTE — Progress Notes (Addendum)
Pt hard to arouse when writer came in to administer medications. Did not respond to voice and unable to stay alert and follow commands. Upon assessment patient was alert and oriented, easy to arouse at shift change, follow commands easily. Found pt blood pressure to be hypotensive, SBP 60's. Blood pressure was rechecked and SBP was 70's. A manual BP was assessed 90/44. Blood pressure cuff sized was change to adult small. DId not administer Metoprolol. MD was called and notified, Metoprolol not given and critical lab : Sodium, 112.  Manual BP to be assessed.

## 2014-04-17 NOTE — ED Notes (Signed)
Patient now with c/o right lower extremity pain Dr. Mora Bellmanni made aware

## 2014-04-17 NOTE — ED Provider Notes (Signed)
CSN: [id number]     Arrival date & time 04/17/14  0128 History   First MD Initiated Contact with Patient 04/17/14 [phone]     Chief Complaint  Patient presents with  . Altered Mental Status  . Hip Injury     (Consider location/radiation/quality/duration/timing/severity/associated sxs/prior Treatment) HPI  Kimberly Avery is a 81 y.o. female with past medical history of hypertension, hyperlipidemia, CKs history. Through the emergency department with altered mental status. Patient normally lives alone and cares for herself. Her family states that visit her today and she was confused, and patient states she had fallen as well. Patient is unable to give further history due to her altered mental status. Family states she recently had a urinary tract infection that was treated with antibiotics.   Past Medical History  Diagnosis Date  . Hypertension   . Hyperlipidemia   . Gout   . Osteoporosis   . CKD (chronic kidney disease) stage 4, GFR 15-29 ml/min   . Glaucoma   . Vitamin D deficiency    Past Surgical History  Procedure Laterality Date  . Breast surgery Bilateral 1970    mastectomies - fiboradenoma  . Eye surgery  2003, 2004  . Temporal artery biopsy / ligation  2001   Family History  Problem Relation Age of Onset  . Heart attack Mother     MI while in hospital, RA, HTN  . Stroke Father     MI, CAD, alcoholism  . Cancer Brother     prostate cancer  . Heart Problems Sister     pacemaker, DM, RA, HTN   History  Substance Use Topics  . Smoking status: Never Smoker   . Smokeless tobacco: Never Used  . Alcohol Use: No   OB History    No data available     Review of Systems  Unable to perform ROS: Mental status change      Allergies  Review of patient's allergies indicates no known allergies.  Home Medications   Prior to Admission medications   Medication Sig Start Date End Date Taking? Authorizing Provider  alendronate (FOSAMAX) 70 MG tablet Take 1 tablet  by mouth once a week. 08/31/13  Yes Historical Provider, MD  ALPRAZolam Prudy Feeler) 0.5 MG tablet Take 0.25-0.5 mg by mouth at bedtime as needed for anxiety.   Yes Historical Provider, MD  aspirin 81 MG tablet Take 81 mg by mouth daily.   Yes Historical Provider, MD  benazepril (LOTENSIN) 20 MG tablet Take 1 tablet (20 mg total) by mouth daily. 04/02/14  Yes Chrystie Nose, MD  dorzolamide-timolol (COSOPT) 22.3-6.8 MG/ML ophthalmic solution Place 1 drop into both eyes 2 (two) times daily. 09/19/13  Yes Historical Provider, MD  indapamide (LOZOL) 2.5 MG tablet Take 2.5 mg by mouth daily.  02/15/14  Yes Historical Provider, MD  metoprolol tartrate (LOPRESSOR) 25 MG tablet Take 0.5 tablets (12.5 mg total) by mouth 2 (two) times daily. Patient taking differently: Take 25 mg by mouth 2 (two) times daily.  10/29/13  Yes Chrystie Nose, MD  simvastatin (ZOCOR) 20 MG tablet Take 1 tablet by mouth daily. 08/27/13  Yes Historical Provider, MD  Vitamin D, Ergocalciferol, (DRISDOL) 50000 UNITS CAPS capsule Take 1 capsule by mouth every 14 (fourteen) days. 09/17/13  Yes Historical Provider, MD  omeprazole (PRILOSEC) 20 MG capsule Take 20 mg by mouth daily.    Historical Provider, MD  SIMETHICONE PO Take 1 tablet by mouth daily as needed.     Historical Provider,  MD   BP 121/57 mmHg  Pulse 66  Temp(Src) 98.5 F (36.9 C) (Rectal)  Resp 16  SpO2 98% Physical Exam  Constitutional: She is oriented to person, place, and time. She appears well-developed. No distress.  HENT:  Head: Normocephalic and atraumatic.  Nose: Nose normal.  Mouth/Throat: No oropharyngeal exudate.  Dry oropharynx  Eyes: Conjunctivae and EOM are normal. Pupils are equal, round, and reactive to light. No scleral icterus.  Neck: Normal range of motion. Neck supple. No JVD present. No tracheal deviation present. No thyromegaly present.  Cardiovascular: Normal rate, regular rhythm and normal heart sounds.  Exam reveals no gallop and no friction rub.    No murmur heard. Pulmonary/Chest: Effort normal and breath sounds normal. No respiratory distress. She has no wheezes. She exhibits no tenderness.  Abdominal: Soft. Bowel sounds are normal. She exhibits no distension and no mass. There is no tenderness. There is no rebound and no guarding.  Musculoskeletal: Normal range of motion. She exhibits no edema or tenderness.  Lymphadenopathy:    She has no cervical adenopathy.  Neurological: She is alert and oriented to person, place, and time. No cranial nerve deficit. She exhibits normal muscle tone.  Normal strength and sensation 4 extremities.  Skin: Skin is warm and dry. No rash noted. She is not diaphoretic. No erythema. No pallor.  Nursing note and vitals reviewed.   ED Course  Procedures (including critical care time) Labs Review Labs Reviewed  CBC WITH DIFFERENTIAL/PLATELET - Abnormal; Notable for the following:    Hemoglobin 11.2 (*)    HCT 31.3 (*)    Platelets 143 (*)    Neutrophils Relative % 84 (*)    Lymphocytes Relative 11 (*)    All other components within normal limits  COMPREHENSIVE METABOLIC PANEL - Abnormal; Notable for the following:    Sodium 107 (*)    Potassium 2.8 (*)    Chloride 70 (*)    CO2 18 (*)    Glucose, Bld 233 (*)    BUN 30 (*)    Creatinine, Ser 1.64 (*)    Calcium 7.9 (*)    AST 125 (*)    ALT 38 (*)    GFR calc non Af Amer 27 (*)    GFR calc Af Amer 31 (*)    Anion gap 19 (*)    All other components within normal limits  URINALYSIS, ROUTINE W REFLEX MICROSCOPIC - Abnormal; Notable for the following:    Hgb urine dipstick LARGE (*)    Protein, ur 100 (*)    All other components within normal limits  URINE MICROSCOPIC-ADD ON - Abnormal; Notable for the following:    Casts GRANULAR CAST (*)    All other components within normal limits  I-STAT CHEM 8, ED - Abnormal; Notable for the following:    Sodium 104 (*)    Potassium 2.9 (*)    Chloride 72 (*)    BUN 27 (*)    Creatinine, Ser 1.60  (*)    Glucose, Bld 233 (*)    Calcium, Ion 0.86 (*)    All other components within normal limits  I-STAT CG4 LACTIC ACID, ED - Abnormal; Notable for the following:    Lactic Acid, Venous 7.08 (*)    All other components within normal limits  I-STAT TROPOININ, ED - Abnormal; Notable for the following:    Troponin i, poc 1.25 (*)    All other components within normal limits  CBG MONITORING, ED - Abnormal; Notable  for the following:    Glucose-Capillary 214 (*)    All other components within normal limits  URINE CULTURE  CULTURE, BLOOD (ROUTINE X 2)  CULTURE, BLOOD (ROUTINE X 2)  LIPASE, BLOOD  PROTIME-INR  URINE RAPID DRUG SCREEN (HOSP PERFORMED)  ETHANOL  MAGNESIUM  I-STAT CG4 LACTIC ACID, ED  I-STAT CHEM 8, ED    Imaging Review Ct Abdomen Pelvis Wo Contrast  04/17/2014   CLINICAL DATA:  Abdominal pain, possible fall.  EXAM: CT ABDOMEN AND PELVIS WITHOUT CONTRAST  TECHNIQUE: Multidetector CT imaging of the abdomen and pelvis was performed following the standard protocol without IV contrast.  COMPARISON:  CT 03/23/2014  FINDINGS: Scarring again seen in both lower lobes. The heart is at the upper limits of normal in size.  Small hepatic hypodensities, unchanged from prior exam, likely cysts. The gallbladder is physiologically distended. The spleen is normal in size. Pancreas is suboptimally assessed, mildly atrophic but without peripancreatic inflammatory change. Right adrenal gland is normal. Thickening of the left adrenal gland is unchanged.  Mild thinning of both renal parenchyma. There is perinephric stranding about the upper left kidney, new from prior. No hydronephrosis or stone.  Stomach is physiologically distended. There are no dilated or thickened bowel loops. Moderate stool throughout the colon.  Dense atherosclerosis of the abdominal aorta.  No aneurysm.  In the pelvis the bladder is distended. The uterus contains multiple fibroids. No gross adnexal mass, detailed pelvic  evaluation is limited.  Comminuted fracture of the right proximal femur primarily intertrochanteric with a subtrochanteric component. Fracture line approaches the femoral neck. Associated soft tissue edema adjacent to the fracture site. Small amount of hemorrhage tracks along the distal right iliopsoas muscle. There is degenerative change of the right hip with probable focus of avascular necrosis with femoral head. Mild compression deformity of T12 appears chronic. There is degenerative change throughout the lumbar spine.  IMPRESSION: 1. Findings suspicious for pyelonephritis of the upper left kidney. 2. Comminuted mildly displaced right proximal femur fracture, primarily intertrochanteric with the subtrochanteric component. Associated hemorrhage, some of which tracks in the distal iliopsoas muscle.   Electronically Signed   By: Rubye Oaks M.D.   On: 04/17/2014 03:45   Dg Chest 2 View  04/17/2014   CLINICAL DATA:  Altered mental status, abdominal pain.  EXAM: CHEST  2 VIEW  COMPARISON:  07/13/2011  FINDINGS: There is hyperinflation of the lungs compatible with COPD. Heart and mediastinal contours are within normal limits. No focal opacities or effusions. No acute bony abnormality.  IMPRESSION: COPD.  No active disease.   Electronically Signed   By: Charlett Nose M.D.   On: 04/17/2014 02:15   Ct Head Wo Contrast  04/17/2014   CLINICAL DATA:  Altered mental status.  Possible fall.  EXAM: CT HEAD WITHOUT CONTRAST  TECHNIQUE: Contiguous axial images were obtained from the base of the skull through the vertex without intravenous contrast.  COMPARISON:  None.  FINDINGS: Age-related atrophy and chronic small vessel ischemia. No intracranial hemorrhage, mass effect, or midline shift. No hydrocephalus. The basilar cisterns are patent. No evidence of territorial infarct. No intracranial fluid collection. Calvarium is intact. Minimal mucosal thickening of left ethmoid air cell. Minimal opacification of lower left  mastoid air cells.  IMPRESSION: Age-related atrophy and chronic small vessel ischemic change. No acute intracranial abnormality.   Electronically Signed   By: Rubye Oaks M.D.   On: 04/17/2014 03:28   Dg Hip Unilat With Pelvis 2-3 Views Right  04/17/2014  CLINICAL DATA:  Right hip pain, no longer ambulatory.  EXAM: RIGHT HIP (WITH PELVIS) 2-3 VIEWS  COMPARISON:  None.  FINDINGS: There is a comminuted displaced fracture of the right proximal femur. This is primarily intertrochanteric and sub trochanteric with involvement of the lesser trochanter. Mild apex lateral angulation and foreshortening. No additional acute fracture. There is degenerative change of the right hip. Calcified uterine fibroids are noted.  IMPRESSION: Comminuted proximal femur fracture involving the intertrochanteric and subtrochanteric femur.   Electronically Signed   By: Rubye Oaks M.D.   On: 04/17/2014 03:23     EKG Interpretation None      MDM   Final diagnoses:  Altered mental status  Abdominal pain  Hip pain    Patient presents emergency department for altered mental status. Initially called as a code stroke, however patient is able to talk with me, has normal cranial nerve exam, and can move all extremities.  Emergency department workup reveals a lactate of 7, sodium of 104, troponin of 1.25. Patient is likely severely dehydrated, she was given 2 L IV fluid bolus. We'll repeat lactate and chem 8. Potassium was also replaced for value of 2.9. Imaging also reveals a right proximal comminuted femur fracture.  I discussed the case with the intensivist who agrees to evaluate the patient emergency department for admission. Orthopedic surgery has been paced for consultation as well. Patient will remain nothing by mouth for now.  CT scan shows possible pyelonephritis. Blood cultures were drawn, patient was given vancomycin and Zosyn for treatment.  CRITICAL CARE Performed by: Tomasita Crumble   Total critical care  time: . - hyponatremia  Critical care time was exclusive of separately billable procedures and treating other patients.  Critical care was necessary to treat or prevent imminent or life-threatening deterioration.  Critical care was time spent personally by me on the following activities: development of treatment plan with patient and/or surrogate as well as nursing, discussions with consultants, evaluation of patient's response to treatment, examination of patient, obtaining history from patient or surrogate, ordering and performing treatments and interventions, ordering and review of laboratory studies, ordering and review of radiographic studies, pulse oximetry and re-evaluation of patient's condition.     Tomasita Crumble, MD 04/17/14 [phone]

## 2014-04-17 NOTE — ED Notes (Signed)
This Clinical research associatewriter only able to obtain one blood culture

## 2014-04-17 NOTE — ED Notes (Signed)
Report given to Sheila, RN

## 2014-04-17 NOTE — ED Notes (Signed)
Patient taken to radiology 

## 2014-04-17 NOTE — ED Notes (Signed)
Notified Oni,MD., pt. Repeated i-stat Chem 8 results pt. Potassium 3.1 and sodium 106 nad i-stat Lactic acid CG4 results 5.70.

## 2014-04-17 NOTE — Progress Notes (Addendum)
PULMONARY / CRITICAL CARE MEDICINE   Name: Kimberly Avery MRN: [id number] DOB: 06-24-1923    ADMISSION DATE:  04/17/2014  REFERRING MD :  ED  CHIEF COMPLAINT:  weakness  INITIAL PRESENTATION:  81 yo female brought to ER with vomiting, weakness, abdominal pain, lethargy.  She was found to have severe hyponatremia, AKI, Rt femoral fx, and elevated lactic acid.  STUDIES:  3/26 CT abd >> pyelonephritis Lt kidney, comminuted and mildly displaced Rt proximal femur fx with hemorrhage tracking to distal iliopsoas muscle  SIGNIFICANT EVENTS: 3/26 Admit, ortho consulted  SUBJECTIVE:  Still confused, but family thinks she looks better.  She denies chest/abd pain, or dyspnea.  Feels hungry/thirsty.  VITAL SIGNS: Temp:  [98 F (36.7 C)-98.5 F (36.9 C)] 98.2 F (36.8 C) (03/26 1200) Pulse Rate:  [59-85] 65 (03/26 1400) Resp:  [10-25] 10 (03/26 1400) BP: (80-152)/(33-101) 98/34 mmHg (03/26 1400) SpO2:  [88 %-100 %] 100 % (03/26 1400) Weight:  [97 lb 14.2 oz (44.4 kg)] 97 lb 14.2 oz (44.4 kg) (03/26 1001) INTAKE / OUTPUT:  Intake/Output Summary (Last 24 hours) at 04/17/14 1413 Last data filed at 04/17/14 1309  Gross per 24 hour  Intake    380 ml  Output    900 ml  Net   -520 ml    PHYSICAL EXAMINATION: General: thin Neuro:  Awake, follows commands, able to state her name HEENT:  Pupils reactive Cardiovascular: irregular Lungs: no wheeze Abdomen:  Soft, nontender Musculoskeletal:  Right leg is externally rotated, no leg edema Skin:  No ecchymosis  LABS:  CBC  Recent Labs Lab 04/17/14 0146 04/17/14 0152 04/17/14 0417 04/17/14 0555  WBC 8.5  --   --  13.3*  HGB 11.2* 13.3 7.8* 8.3*  HCT 31.3* 39.0 23.0* 22.5*  PLT 143*  --   --  110*   Coag's  Recent Labs Lab 04/17/14 0146  INR 1.08   BMET  Recent Labs Lab 04/17/14 0146  04/17/14 0417 04/17/14 0555 04/17/14 1045  NA 107*  < > 106* 110* 110*  K 2.8*  < > 3.1* 3.4* 3.1*  CL 70*  < > 73* 80* 80*   CO2 18*  --   --  16* 19  BUN 30*  < > 29* 29* 30*  CREATININE 1.64*  < > 1.70* 1.54* 1.51*  GLUCOSE 233*  < > 207* 130* 82  < > = values in this interval not displayed.   Electrolytes  Recent Labs Lab 04/17/14 0146 04/17/14 0555 04/17/14 1045  CALCIUM 7.9* 6.6* 6.8*  MG 1.9 2.1  --   PHOS  --  3.3  --    Sepsis Markers  Recent Labs Lab 04/17/14 0155 04/17/14 0414 04/17/14 0628  LATICACIDVEN 7.08* 5.70* 3.7*   ABG  Recent Labs Lab 04/17/14 0435  PHART 7.425  PCO2ART 24.5*  PO2ART 141.0*   Liver Enzymes  Recent Labs Lab 04/17/14 0146  AST 125*  ALT 38*  ALKPHOS 63  BILITOT 1.0  ALBUMIN 3.7   Glucose  Recent Labs Lab 04/17/14 0152 04/17/14 1229  GLUCAP 214* 105*    Imaging Ct Abdomen Pelvis Wo Contrast  04/17/2014   CLINICAL DATA:  Abdominal pain, possible fall.  EXAM: CT ABDOMEN AND PELVIS WITHOUT CONTRAST  TECHNIQUE: Multidetector CT imaging of the abdomen and pelvis was performed following the standard protocol without IV contrast.  COMPARISON:  CT 03/23/2014  FINDINGS: Scarring again seen in both lower lobes. The heart is at the upper limits of normal  in size.  Small hepatic hypodensities, unchanged from prior exam, likely cysts. The gallbladder is physiologically distended. The spleen is normal in size. Pancreas is suboptimally assessed, mildly atrophic but without peripancreatic inflammatory change. Right adrenal gland is normal. Thickening of the left adrenal gland is unchanged.  Mild thinning of both renal parenchyma. There is perinephric stranding about the upper left kidney, new from prior. No hydronephrosis or stone.  Stomach is physiologically distended. There are no dilated or thickened bowel loops. Moderate stool throughout the colon.  Dense atherosclerosis of the abdominal aorta.  No aneurysm.  In the pelvis the bladder is distended. The uterus contains multiple fibroids. No gross adnexal mass, detailed pelvic evaluation is limited.  Comminuted  fracture of the right proximal femur primarily intertrochanteric with a subtrochanteric component. Fracture line approaches the femoral neck. Associated soft tissue edema adjacent to the fracture site. Small amount of hemorrhage tracks along the distal right iliopsoas muscle. There is degenerative change of the right hip with probable focus of avascular necrosis with femoral head. Mild compression deformity of T12 appears chronic. There is degenerative change throughout the lumbar spine.  IMPRESSION: 1. Findings suspicious for pyelonephritis of the upper left kidney. 2. Comminuted mildly displaced right proximal femur fracture, primarily intertrochanteric with the subtrochanteric component. Associated hemorrhage, some of which tracks in the distal iliopsoas muscle.   Electronically Signed   By: Rubye Oaks M.D.   On: 04/17/2014 03:45   Dg Chest 2 View  04/17/2014   CLINICAL DATA:  Altered mental status, abdominal pain.  EXAM: CHEST  2 VIEW  COMPARISON:  07/13/2011  FINDINGS: There is hyperinflation of the lungs compatible with COPD. Heart and mediastinal contours are within normal limits. No focal opacities or effusions. No acute bony abnormality.  IMPRESSION: COPD.  No active disease.   Electronically Signed   By: Charlett Nose M.D.   On: 04/17/2014 02:15   Ct Head Wo Contrast  04/17/2014   CLINICAL DATA:  Altered mental status.  Possible fall.  EXAM: CT HEAD WITHOUT CONTRAST  TECHNIQUE: Contiguous axial images were obtained from the base of the skull through the vertex without intravenous contrast.  COMPARISON:  None.  FINDINGS: Age-related atrophy and chronic small vessel ischemia. No intracranial hemorrhage, mass effect, or midline shift. No hydrocephalus. The basilar cisterns are patent. No evidence of territorial infarct. No intracranial fluid collection. Calvarium is intact. Minimal mucosal thickening of left ethmoid air cell. Minimal opacification of lower left mastoid air cells.  IMPRESSION:  Age-related atrophy and chronic small vessel ischemic change. No acute intracranial abnormality.   Electronically Signed   By: Rubye Oaks M.D.   On: 04/17/2014 03:28   Dg Hip Unilat With Pelvis 2-3 Views Right  04/17/2014   CLINICAL DATA:  Right hip pain, no longer ambulatory.  EXAM: RIGHT HIP (WITH PELVIS) 2-3 VIEWS  COMPARISON:  None.  FINDINGS: There is a comminuted displaced fracture of the right proximal femur. This is primarily intertrochanteric and sub trochanteric with involvement of the lesser trochanter. Mild apex lateral angulation and foreshortening. No additional acute fracture. There is degenerative change of the right hip. Calcified uterine fibroids are noted.  IMPRESSION: Comminuted proximal femur fracture involving the intertrochanteric and subtrochanteric femur.   Electronically Signed   By: Rubye Oaks M.D.   On: 04/17/2014 03:23      ASSESSMENT / PLAN:  PULMONARY A:  No acute issues. P:   Monitor respiratory status  CARDIOVASCULAR A:  Hx of HTN, HLD. P:  Continue  lopressor, zocor Hold outpt benazepril Hold ASA, SQ heparin due to bleeding at hip fx site  RENAL A:   AKI. Hx of Stage 2 CKD. Severe hyponatremia in setting of hypovolemia >> likely from diuretic use. Lactic acidosis. Hypokalemia. P:   Continue NS IV fluid BMET q4h F/u lactic acid Check cortisol, TSH Replace electrolytes as needed  GASTROINTESTINAL A:   Nausea with vomiting. Hx of chronic diarrhea. Hx of GERD. P:   Continue diet Protonix for SUP  HEMATOLOGIC A:   Anemia, thrombocytopenia of critical illness. P:  F/u CBC SCD's for DVT prevention  INFECTIOUS A:   Sepsis from pyelonephritis. P:   Day 1 rocephin  Blood 3/26 >> Urine 3/26 >>  ENDOCRINE A:   Hyperglycemia. P:   Monitor blood sugars on BMET  NEUROLOGIC A:  Acute encephalopathy 2nd to hyponatremia, AKI, sepsis. P:   Monitor mental status  ORTHOPEDICS A: Rt hip  intertrochanteric/subtrochanteric fracture. P: Ortho to proceed with nailing when medically stable  Updated pt's family at bedside.  CC time 50 minutes.  Coralyn HellingVineet Adellyn Capek, MD [facility] Pulmonary/Critical Care 04/17/2014, 2:40 PM Pager:  [phone] After 3pm call: [phone]

## 2014-04-17 NOTE — ED Notes (Signed)
Na [address] Called by Marcelle SmilingNatasha (Lab) RN aware/MD aware

## 2014-04-17 NOTE — Consult Note (Addendum)
Reason for Consult: Injured right hip possible fall   Referring Physician: EDP  Kimberly Avery is an 81 y.o. female.  HPI: Possible fall. Pain in hip.  Past Medical History  Diagnosis Date  . Hypertension   . Hyperlipidemia   . Gout   . Osteoporosis   . CKD (chronic kidney disease) stage 4, GFR 15-29 ml/min   . Glaucoma   . Vitamin D deficiency     Past Surgical History  Procedure Laterality Date  . Breast surgery Bilateral 1970    mastectomies - fiboradenoma  . Eye surgery  2003, 2004  . Temporal artery biopsy / ligation  2001    Family History  Problem Relation Age of Onset  . Heart attack Mother     MI while in hospital, RA, HTN  . Stroke Father     MI, CAD, alcoholism  . Cancer Brother     prostate cancer  . Heart Problems Sister     pacemaker, DM, RA, HTN    Social History:  reports that she has never smoked. She has never used smokeless tobacco. She reports that she does not drink alcohol or use illicit drugs.  Allergies: No Known Allergies  Medications: I have reviewed the patient's current medications.  Results for orders placed or performed during the hospital encounter of 04/17/14 (from the past 48 hour(s))  CBC with Differential/Platelet     Status: Abnormal   Collection Time: 04/17/14  1:46 AM  Result Value Ref Range   WBC 8.5 4.0 - 10.5 K/uL   RBC 4.00 3.87 - 5.11 MIL/uL   Hemoglobin 11.2 (L) 12.0 - 15.0 g/dL   HCT 31.3 (L) 36.0 - 46.0 %   MCV 78.3 78.0 - 100.0 fL   MCH 28.0 26.0 - 34.0 pg   MCHC 35.8 30.0 - 36.0 g/dL   RDW 12.3 11.5 - 15.5 %   Platelets 143 (L) 150 - 400 K/uL   Neutrophils Relative % 84 (H) 43 - 77 %   Neutro Abs 7.2 1.7 - 7.7 K/uL   Lymphocytes Relative 11 (L) 12 - 46 %   Lymphs Abs 0.9 0.7 - 4.0 K/uL   Monocytes Relative 5 3 - 12 %   Monocytes Absolute 0.5 0.1 - 1.0 K/uL   Eosinophils Relative 0 0 - 5 %   Eosinophils Absolute 0.0 0.0 - 0.7 K/uL   Basophils Relative 0 0 - 1 %   Basophils Absolute 0.0 0.0 - 0.1 K/uL   Comprehensive metabolic panel     Status: Abnormal   Collection Time: 04/17/14  1:46 AM  Result Value Ref Range   Sodium 107 (LL) 135 - 145 mmol/L    Comment: REPEATED TO VERIFY CRITICAL RESULT CALLED TO, READ BACK BY AND VERIFIED WITH: E.TOSTEN,RN AT 0231 ON 04/17/14 BY W.SHEA,MT    Potassium 2.8 (L) 3.5 - 5.1 mmol/L   Chloride 70 (L) 96 - 112 mmol/L   CO2 18 (L) 19 - 32 mmol/L   Glucose, Bld 233 (H) 70 - 99 mg/dL   BUN 30 (H) 6 - 23 mg/dL   Creatinine, Ser 1.64 (H) 0.50 - 1.10 mg/dL   Calcium 7.9 (L) 8.4 - 10.5 mg/dL   Total Protein 6.6 6.0 - 8.3 g/dL   Albumin 3.7 3.5 - 5.2 g/dL   AST 125 (H) 0 - 37 U/L   ALT 38 (H) 0 - 35 U/L   Alkaline Phosphatase 63 39 - 117 U/L   Total Bilirubin 1.0 0.3 - 1.2  mg/dL   GFR calc non Af Amer 27 (L) >90 mL/min   GFR calc Af Amer 31 (L) >90 mL/min    Comment: (NOTE) The eGFR has been calculated using the CKD EPI equation. This calculation has not been validated in all clinical situations. eGFR's persistently <90 mL/min signify possible Chronic Kidney Disease.    Anion gap 19 (H) 5 - 15  Lipase, blood     Status: None   Collection Time: 04/17/14  1:46 AM  Result Value Ref Range   Lipase 42 11 - 59 U/L  Protime-INR     Status: None   Collection Time: 04/17/14  1:46 AM  Result Value Ref Range   Prothrombin Time 14.2 11.6 - 15.2 seconds   INR 1.08 0.00 - 1.49  Magnesium     Status: None   Collection Time: 04/17/14  1:46 AM  Result Value Ref Range   Magnesium 1.9 1.5 - 2.5 mg/dL  I-stat chem 8, ed     Status: Abnormal   Collection Time: 04/17/14  1:52 AM  Result Value Ref Range   Sodium 104 (LL) 135 - 145 mmol/L   Potassium 2.9 (L) 3.5 - 5.1 mmol/L   Chloride 72 (L) 96 - 112 mmol/L   BUN 27 (H) 6 - 23 mg/dL   Creatinine, Ser 1.60 (H) 0.50 - 1.10 mg/dL   Glucose, Bld 233 (H) 70 - 99 mg/dL   Calcium, Ion 0.86 (L) 1.13 - 1.30 mmol/L   TCO2 13 0 - 100 mmol/L   Hemoglobin 13.3 12.0 - 15.0 g/dL   HCT 39.0 36.0 - 46.0 %  I-stat troponin,  ED     Status: Abnormal   Collection Time: 04/17/14  1:52 AM  Result Value Ref Range   Troponin i, poc 1.25 (HH) 0.00 - 0.08 ng/mL   Comment 3            Comment: Due to the release kinetics of cTnI, a negative result within the first hours of the onset of symptoms does not rule out myocardial infarction with certainty. If myocardial infarction is still suspected, repeat the test at appropriate intervals.   CBG monitoring, ED     Status: Abnormal   Collection Time: 04/17/14  1:52 AM  Result Value Ref Range   Glucose-Capillary 214 (H) 70 - 99 mg/dL   Comment 1 Notify RN    Comment 2 Document in Chart   Urinalysis, Routine w reflex microscopic     Status: Abnormal   Collection Time: 04/17/14  1:54 AM  Result Value Ref Range   Color, Urine YELLOW YELLOW   APPearance CLEAR CLEAR   Specific Gravity, Urine 1.008 1.005 - 1.030   pH 7.0 5.0 - 8.0   Glucose, UA NEGATIVE NEGATIVE mg/dL   Hgb urine dipstick LARGE (A) NEGATIVE   Bilirubin Urine NEGATIVE NEGATIVE   Ketones, ur NEGATIVE NEGATIVE mg/dL   Protein, ur 100 (A) NEGATIVE mg/dL   Urobilinogen, UA 0.2 0.0 - 1.0 mg/dL   Nitrite NEGATIVE NEGATIVE   Leukocytes, UA NEGATIVE NEGATIVE  Urine rapid drug screen (hosp performed)     Status: None   Collection Time: 04/17/14  1:54 AM  Result Value Ref Range   Opiates NONE DETECTED NONE DETECTED   Cocaine NONE DETECTED NONE DETECTED   Benzodiazepines NONE DETECTED NONE DETECTED   Amphetamines NONE DETECTED NONE DETECTED   Tetrahydrocannabinol NONE DETECTED NONE DETECTED   Barbiturates NONE DETECTED NONE DETECTED    Comment:  DRUG SCREEN FOR MEDICAL PURPOSES ONLY.  IF CONFIRMATION IS NEEDED FOR ANY PURPOSE, NOTIFY LAB WITHIN 5 DAYS.        LOWEST DETECTABLE LIMITS FOR URINE DRUG SCREEN Drug Class       Cutoff (ng/mL) Amphetamine      1000 Barbiturate      200 Benzodiazepine   417 Tricyclics       408 Opiates          300 Cocaine          300 THC              50   Urine  microscopic-add on     Status: Abnormal   Collection Time: 04/17/14  1:54 AM  Result Value Ref Range   Squamous Epithelial / LPF RARE RARE   WBC, UA 0-2 <3 WBC/hpf   RBC / HPF 7-10 <3 RBC/hpf   Bacteria, UA RARE RARE   Casts GRANULAR CAST (A) NEGATIVE  I-Stat CG4 Lactic Acid, ED     Status: Abnormal   Collection Time: 04/17/14  1:55 AM  Result Value Ref Range   Lactic Acid, Venous 7.08 (HH) 0.5 - 2.0 mmol/L  Ethanol     Status: None   Collection Time: 04/17/14  2:37 AM  Result Value Ref Range   Alcohol, Ethyl (B) <5 0 - 9 mg/dL    Comment:        LOWEST DETECTABLE LIMIT FOR SERUM ALCOHOL IS 11 mg/dL FOR MEDICAL PURPOSES ONLY   I-Stat CG4 Lactic Acid, ED     Status: Abnormal   Collection Time: 04/17/14  4:14 AM  Result Value Ref Range   Lactic Acid, Venous 5.70 (HH) 0.5 - 2.0 mmol/L  I-stat chem 8, ed     Status: Abnormal   Collection Time: 04/17/14  4:17 AM  Result Value Ref Range   Sodium 106 (LL) 135 - 145 mmol/L   Potassium 3.1 (L) 3.5 - 5.1 mmol/L   Chloride 73 (L) 96 - 112 mmol/L   BUN 29 (H) 6 - 23 mg/dL   Creatinine, Ser 1.70 (H) 0.50 - 1.10 mg/dL   Glucose, Bld 207 (H) 70 - 99 mg/dL   Calcium, Ion 1.00 (L) 1.13 - 1.30 mmol/L   TCO2 15 0 - 100 mmol/L   Hemoglobin 7.8 (L) 12.0 - 15.0 g/dL   HCT 23.0 (L) 36.0 - 46.0 %  Blood gas, arterial     Status: Abnormal   Collection Time: 04/17/14  4:35 AM  Result Value Ref Range   O2 Content 2.5 L/min   Delivery systems NASAL CANNULA    pH, Arterial 7.425 7.350 - 7.450   pCO2 arterial 24.5 (L) 35.0 - 45.0 mmHg   pO2, Arterial 141.0 (H) 80.0 - 100.0 mmHg   Bicarbonate 15.9 (L) 20.0 - 24.0 mEq/L   TCO2 15.0 0 - 100 mmol/L   Acid-base deficit 7.3 (H) 0.0 - 2.0 mmol/L   O2 Saturation 98.4 %   Patient temperature 97.4    Collection site RIGHT RADIAL    Drawn by [id number]    Sample type ARTERIAL    Allens test (pass/fail) PASS PASS  Basic metabolic panel     Status: Abnormal   Collection Time: 04/17/14  5:55 AM  Result  Value Ref Range   Sodium 110 (LL) 135 - 145 mmol/L    Comment: REPEATED TO VERIFY CRITICAL RESULT CALLED TO, READ BACK BY AND VERIFIED WITH: E BLAKEY AT 0705 ON 03.26.2016 BY NBROOKS  Potassium 3.4 (L) 3.5 - 5.1 mmol/L   Chloride 80 (L) 96 - 112 mmol/L    Comment: RESULT REPEATED AND VERIFIED DELTA CHECK NOTED    CO2 16 (L) 19 - 32 mmol/L   Glucose, Bld 130 (H) 70 - 99 mg/dL   BUN 29 (H) 6 - 23 mg/dL   Creatinine, Ser 1.54 (H) 0.50 - 1.10 mg/dL   Calcium 6.6 (L) 8.4 - 10.5 mg/dL   GFR calc non Af Amer 29 (L) >90 mL/min   GFR calc Af Amer 33 (L) >90 mL/min    Comment: (NOTE) The eGFR has been calculated using the CKD EPI equation. This calculation has not been validated in all clinical situations. eGFR's persistently <90 mL/min signify possible Chronic Kidney Disease.    Anion gap 14 5 - 15  Magnesium     Status: None   Collection Time: 04/17/14  5:55 AM  Result Value Ref Range   Magnesium 2.1 1.5 - 2.5 mg/dL  Phosphorus     Status: None   Collection Time: 04/17/14  5:55 AM  Result Value Ref Range   Phosphorus 3.3 2.3 - 4.6 mg/dL  CBC     Status: Abnormal   Collection Time: 04/17/14  5:55 AM  Result Value Ref Range   WBC 13.3 (H) 4.0 - 10.5 K/uL   RBC 2.91 (L) 3.87 - 5.11 MIL/uL   Hemoglobin 8.3 (L) 12.0 - 15.0 g/dL   HCT 22.5 (L) 36.0 - 46.0 %   MCV 77.3 (L) 78.0 - 100.0 fL   MCH 28.5 26.0 - 34.0 pg   MCHC 36.9 (H) 30.0 - 36.0 g/dL   RDW 12.4 11.5 - 15.5 %   Platelets 110 (L) 150 - 400 K/uL    Comment: SPECIMEN CHECKED FOR CLOTS PLATELET COUNT CONFIRMED BY SMEAR   Lactic acid, plasma     Status: Abnormal   Collection Time: 04/17/14  6:28 AM  Result Value Ref Range   Lactic Acid, Venous 3.7 (HH) 0.5 - 2.0 mmol/L    Comment: REPEATED TO VERIFY CRITICAL RESULT CALLED TO, READ BACK BY AND VERIFIED WITH: P DOWD AT 5974 ON 03.26.2016 BY NBROOKS     Ct Abdomen Pelvis Wo Contrast  04/17/2014   CLINICAL DATA:  Abdominal pain, possible fall.  EXAM: CT ABDOMEN AND  PELVIS WITHOUT CONTRAST  TECHNIQUE: Multidetector CT imaging of the abdomen and pelvis was performed following the standard protocol without IV contrast.  COMPARISON:  CT 03/23/2014  FINDINGS: Scarring again seen in both lower lobes. The heart is at the upper limits of normal in size.  Small hepatic hypodensities, unchanged from prior exam, likely cysts. The gallbladder is physiologically distended. The spleen is normal in size. Pancreas is suboptimally assessed, mildly atrophic but without peripancreatic inflammatory change. Right adrenal gland is normal. Thickening of the left adrenal gland is unchanged.  Mild thinning of both renal parenchyma. There is perinephric stranding about the upper left kidney, new from prior. No hydronephrosis or stone.  Stomach is physiologically distended. There are no dilated or thickened bowel loops. Moderate stool throughout the colon.  Dense atherosclerosis of the abdominal aorta.  No aneurysm.  In the pelvis the bladder is distended. The uterus contains multiple fibroids. No gross adnexal mass, detailed pelvic evaluation is limited.  Comminuted fracture of the right proximal femur primarily intertrochanteric with a subtrochanteric component. Fracture line approaches the femoral neck. Associated soft tissue edema adjacent to the fracture site. Small amount of hemorrhage tracks along the distal right iliopsoas  muscle. There is degenerative change of the right hip with probable focus of avascular necrosis with femoral head. Mild compression deformity of T12 appears chronic. There is degenerative change throughout the lumbar spine.  IMPRESSION: 1. Findings suspicious for pyelonephritis of the upper left kidney. 2. Comminuted mildly displaced right proximal femur fracture, primarily intertrochanteric with the subtrochanteric component. Associated hemorrhage, some of which tracks in the distal iliopsoas muscle.   Electronically Signed   By: Jeb Levering M.D.   On: 04/17/2014 03:45    Dg Chest 2 View  04/17/2014   CLINICAL DATA:  Altered mental status, abdominal pain.  EXAM: CHEST  2 VIEW  COMPARISON:  07/13/2011  FINDINGS: There is hyperinflation of the lungs compatible with COPD. Heart and mediastinal contours are within normal limits. No focal opacities or effusions. No acute bony abnormality.  IMPRESSION: COPD.  No active disease.   Electronically Signed   By: Rolm Baptise M.D.   On: 04/17/2014 02:15   Ct Head Wo Contrast  04/17/2014   CLINICAL DATA:  Altered mental status.  Possible fall.  EXAM: CT HEAD WITHOUT CONTRAST  TECHNIQUE: Contiguous axial images were obtained from the base of the skull through the vertex without intravenous contrast.  COMPARISON:  None.  FINDINGS: Age-related atrophy and chronic small vessel ischemia. No intracranial hemorrhage, mass effect, or midline shift. No hydrocephalus. The basilar cisterns are patent. No evidence of territorial infarct. No intracranial fluid collection. Calvarium is intact. Minimal mucosal thickening of left ethmoid air cell. Minimal opacification of lower left mastoid air cells.  IMPRESSION: Age-related atrophy and chronic small vessel ischemic change. No acute intracranial abnormality.   Electronically Signed   By: Jeb Levering M.D.   On: 04/17/2014 03:28   Dg Hip Unilat With Pelvis 2-3 Views Right  04/17/2014   CLINICAL DATA:  Right hip pain, no longer ambulatory.  EXAM: RIGHT HIP (WITH PELVIS) 2-3 VIEWS  COMPARISON:  None.  FINDINGS: There is a comminuted displaced fracture of the right proximal femur. This is primarily intertrochanteric and sub trochanteric with involvement of the lesser trochanter. Mild apex lateral angulation and foreshortening. No additional acute fracture. There is degenerative change of the right hip. Calcified uterine fibroids are noted.  IMPRESSION: Comminuted proximal femur fracture involving the intertrochanteric and subtrochanteric femur.   Electronically Signed   By: Jeb Levering M.D.    On: 04/17/2014 03:23    Review of Systems  Musculoskeletal: Positive for joint pain.  Neurological: Positive for weakness.  All other systems reviewed and are negative.  Blood pressure 94/35, pulse 62, temperature 98.5 F (36.9 C), temperature source Rectal, resp. rate 16, SpO2 100 %. Physical Exam  Constitutional: She is oriented to person, place, and time. She appears distressed.  HENT:  Head: Normocephalic.  Eyes: Pupils are equal, round, and reactive to light.  Neck: Normal range of motion.  Respiratory: Effort normal.  GI: Soft.  Musculoskeletal: She exhibits tenderness.  Pain with attempted ROM right hip. Neurovascularly intact. No DVT  Neurological: She is alert and oriented to person, place, and time.  Skin: Skin is warm and dry.  Psychiatric: She has a normal mood and affect.    Assessment/Plan:  Right hip intertrochanteric subtroch fracture. Critical hyponatremia. Hypokalemic. Plan IM nailing after medically cleared. Bedrest. Pillow splinting prn. Dr. Alvan Dame to see tomorrow.  Johnn Hai  [phone]. 04/17/2014, 8:03 AM   Spoke with daughter Vaughan Basta discussing surgery when medically stabilized.

## 2014-04-17 NOTE — ED Notes (Signed)
Consulting MD at bedside

## 2014-04-17 NOTE — ED Notes (Signed)
Patient back to radiology for CT

## 2014-04-18 DIAGNOSIS — N10 Acute tubulo-interstitial nephritis: Secondary | ICD-10-CM

## 2014-04-18 DIAGNOSIS — A419 Sepsis, unspecified organism: Principal | ICD-10-CM

## 2014-04-18 LAB — BASIC METABOLIC PANEL
ANION GAP: 7 (ref 5–15)
Anion gap: 9 (ref 5–15)
Anion gap: 11 (ref 5–15)
Anion gap: 10 (ref 5–15)
BUN: 34 mg/dL — AB (ref 6–23)
BUN: 30 mg/dL — ABNORMAL HIGH (ref 6–23)
BUN: 32 mg/dL — ABNORMAL HIGH (ref 6–23)
BUN: 35 mg/dL — AB (ref 6–23)
CALCIUM: 6.9 mg/dL — AB (ref 8.4–10.5)
CALCIUM: 7.2 mg/dL — AB (ref 8.4–10.5)
CHLORIDE: 90 mmol/L — AB (ref 96–112)
CHLORIDE: 90 mmol/L — AB (ref 96–112)
CHLORIDE: 92 mmol/L — AB (ref 96–112)
CO2: 18 mmol/L — AB (ref 19–32)
CO2: 18 mmol/L — AB (ref 19–32)
CO2: 16 mmol/L — ABNORMAL LOW (ref 19–32)
CO2: 19 mmol/L (ref 19–32)
CREATININE: 1.47 mg/dL — AB (ref 0.50–1.10)
Calcium: 6.7 mg/dL — ABNORMAL LOW (ref 8.4–10.5)
Calcium: 7 mg/dL — ABNORMAL LOW (ref 8.4–10.5)
Chloride: 92 mmol/L — ABNORMAL LOW (ref 96–112)
Creatinine, Ser: 1.46 mg/dL — ABNORMAL HIGH (ref 0.50–1.10)
Creatinine, Ser: 1.48 mg/dL — ABNORMAL HIGH (ref 0.50–1.10)
Creatinine, Ser: 1.45 mg/dL — ABNORMAL HIGH (ref 0.50–1.10)
GFR calc Af Amer: 36 mL/min — ABNORMAL LOW (ref >90)
GFR calc Af Amer: 35 mL/min — ABNORMAL LOW (ref >90)
GFR calc non Af Amer: 31 mL/min — ABNORMAL LOW (ref >90)
GFR calc non Af Amer: 30 mL/min — ABNORMAL LOW (ref >90)
GFR calc non Af Amer: 30 mL/min — ABNORMAL LOW (ref >90)
GFR, EST AFRICAN AMERICAN: 35 mL/min — AB (ref >90)
GFR, EST AFRICAN AMERICAN: 36 mL/min — AB (ref >90)
GFR, EST NON AFRICAN AMERICAN: 31 mL/min — AB (ref >90)
Glucose, Bld: 101 mg/dL — ABNORMAL HIGH (ref 70–99)
Glucose, Bld: 102 mg/dL — ABNORMAL HIGH (ref 70–99)
Glucose, Bld: 98 mg/dL (ref 70–99)
Glucose, Bld: 116 mg/dL — ABNORMAL HIGH (ref 70–99)
POTASSIUM: 3 mmol/L — AB (ref 3.5–5.1)
Potassium: 3.8 mmol/L (ref 3.5–5.1)
Potassium: 3.5 mmol/L (ref 3.5–5.1)
Potassium: 3.5 mmol/L (ref 3.5–5.1)
SODIUM: 121 mmol/L — AB (ref 135–145)
Sodium: 115 mmol/L — CL (ref 135–145)
Sodium: 117 mmol/L — CL (ref 135–145)
Sodium: 119 mmol/L — CL (ref 135–145)

## 2014-04-18 LAB — URINE CULTURE
Colony Count: NO GROWTH
Culture: NO GROWTH

## 2014-04-18 LAB — GLUCOSE, CAPILLARY: Glucose-Capillary: 108 mg/dL — ABNORMAL HIGH (ref 70–99)

## 2014-04-18 LAB — CBC
HEMATOCRIT: 20.8 % — AB (ref 36.0–46.0)
HEMOGLOBIN: 7.5 g/dL — AB (ref 12.0–15.0)
MCH: 28.1 pg (ref 26.0–34.0)
MCHC: 36.1 g/dL — ABNORMAL HIGH (ref 30.0–36.0)
MCV: 77.9 fL — AB (ref 78.0–100.0)
Platelets: 118 10*3/uL — ABNORMAL LOW (ref 150–400)
RBC: 2.67 MIL/uL — ABNORMAL LOW (ref 3.87–5.11)
RDW: 12.9 % (ref 11.5–15.5)
WBC: 12.7 10*3/uL — ABNORMAL HIGH (ref 4.0–10.5)

## 2014-04-18 LAB — MAGNESIUM: Magnesium: UNDETERMINED mg/dL (ref 1.5–2.5)

## 2014-04-18 LAB — PHOSPHORUS: PHOSPHORUS: UNDETERMINED mg/dL (ref 2.3–4.6)

## 2014-04-18 LAB — HEPATIC FUNCTION PANEL
ALBUMIN: 3 g/dL — AB (ref 3.5–5.2)
ALK PHOS: 46 U/L (ref 39–117)
ALT: 49 U/L — AB (ref 0–35)
AST: 167 U/L — ABNORMAL HIGH (ref 0–37)
Bilirubin, Direct: <0.1 mg/dL (ref 0.0–0.5)
TOTAL PROTEIN: 5.2 g/dL — AB (ref 6.0–8.3)
Total Bilirubin: 0.6 mg/dL (ref 0.3–1.2)

## 2014-04-18 LAB — CORTISOL: Cortisol, Plasma: 100.1 ug/dL

## 2014-04-18 NOTE — Progress Notes (Signed)
Utilization review completed.  

## 2014-04-18 NOTE — Progress Notes (Signed)
PULMONARY / CRITICAL CARE MEDICINE   Name: Kimberly Avery MRN: [id number] DOB: 11-20-1923    ADMISSION DATE:  04/17/2014  REFERRING MD :  ED  CHIEF COMPLAINT:  weakness  INITIAL PRESENTATION:  81 yo female brought to ER with vomiting, weakness, abdominal pain, lethargy.  She was found to have severe hyponatremia, AKI, Rt femoral fx, and elevated lactic acid.  STUDIES:  3/26 CT abd >> pyelonephritis Lt kidney, comminuted and mildly displaced Rt proximal femur fx with hemorrhage tracking to distal iliopsoas muscle  SIGNIFICANT EVENTS: 3/26 Admit, ortho consulted  SUBJECTIVE:  More alert.  C/o Rt hip pain.  Does not remember being in hospital yesterday.  VITAL SIGNS: Temp:  [97.5 F (36.4 C)-98.8 F (37.1 C)] 97.5 F (36.4 C) (03/27 0800) Pulse Rate:  [51-76] 51 (03/27 0800) Resp:  [10-19] 17 (03/27 0600) BP: (81-146)/(30-79) 141/52 mmHg (03/27 0800) SpO2:  [92 %-100 %] 92 % (03/27 0800) Weight:  [100 lb 12 oz (45.7 kg)] 100 lb 12 oz (45.7 kg) (03/27 0616) INTAKE / OUTPUT:  Intake/Output Summary (Last 24 hours) at 04/18/14 1026 Last data filed at 04/18/14 0800  Gross per 24 hour  Intake   1500 ml  Output   2050 ml  Net   -550 ml    PHYSICAL EXAMINATION: General: thin Neuro:  Awake, follows commands, more interactive, able to state her name HEENT:  Pupils reactive Cardiovascular: irregular Lungs: no wheeze Abdomen:  Soft, nontender Musculoskeletal:  Right leg is externally rotated, no leg edema Skin:  No ecchymosis  LABS:  CBC  Recent Labs Lab 04/17/14 0146  04/17/14 0417 04/17/14 0555 04/18/14 0812  WBC 8.5  --   --  13.3* 12.7*  HGB 11.2*  < > 7.8* 8.3* 7.5*  HCT 31.3*  < > 23.0* 22.5* 20.8*  PLT 143*  --   --  110* 118*  < > = values in this interval not displayed.   Coag's  Recent Labs Lab 04/17/14 0146  INR 1.08   BMET  Recent Labs Lab 04/17/14 2307 04/18/14 0353 04/18/14 0812  NA 112* 115* 117*  K 3.7 3.8 3.5  CL 86* 90* 90*   CO2 18* 18* 18*  BUN 33* 34* 30*  CREATININE 1.45* 1.46* 1.47*  GLUCOSE 122* 101* 102*    Electrolytes  Recent Labs Lab 04/17/14 0146 04/17/14 0555  04/17/14 2307 04/18/14 0353 04/18/14 0812  CALCIUM 7.9* 6.6*  < > 6.7* 6.7* 6.9*  MG 1.9 2.1  --   --   --  QUANTITY NOT SUFFICIENT, UNABLE TO PERFORM TEST  PHOS  --  3.3  --   --   --  QUANTITY NOT SUFFICIENT, UNABLE TO PERFORM TEST  < > = values in this interval not displayed.   Sepsis Markers  Recent Labs Lab 04/17/14 0414 04/17/14 0628 04/17/14 1529  LATICACIDVEN 5.70* 3.7* 2.3*   ABG  Recent Labs Lab 04/17/14 0435  PHART 7.425  PCO2ART 24.5*  PO2ART 141.0*   Liver Enzymes  Recent Labs Lab 04/17/14 0146 04/18/14 0812  AST 125* 167*  ALT 38* 49*  ALKPHOS 63 46  BILITOT 1.0 0.6  ALBUMIN 3.7 3.0*   Glucose  Recent Labs Lab 04/17/14 0152 04/17/14 0749 04/17/14 1229 04/17/14 1705 04/18/14 0811  GLUCAP 214* 130* 105* 120* 108*    Imaging Ct Abdomen Pelvis Wo Contrast  04/17/2014   CLINICAL DATA:  Abdominal pain, possible fall.  EXAM: CT ABDOMEN AND PELVIS WITHOUT CONTRAST  TECHNIQUE: Multidetector CT imaging of  the abdomen and pelvis was performed following the standard protocol without IV contrast.  COMPARISON:  CT 03/23/2014  FINDINGS: Scarring again seen in both lower lobes. The heart is at the upper limits of normal in size.  Small hepatic hypodensities, unchanged from prior exam, likely cysts. The gallbladder is physiologically distended. The spleen is normal in size. Pancreas is suboptimally assessed, mildly atrophic but without peripancreatic inflammatory change. Right adrenal gland is normal. Thickening of the left adrenal gland is unchanged.  Mild thinning of both renal parenchyma. There is perinephric stranding about the upper left kidney, new from prior. No hydronephrosis or stone.  Stomach is physiologically distended. There are no dilated or thickened bowel loops. Moderate stool throughout the  colon.  Dense atherosclerosis of the abdominal aorta.  No aneurysm.  In the pelvis the bladder is distended. The uterus contains multiple fibroids. No gross adnexal mass, detailed pelvic evaluation is limited.  Comminuted fracture of the right proximal femur primarily intertrochanteric with a subtrochanteric component. Fracture line approaches the femoral neck. Associated soft tissue edema adjacent to the fracture site. Small amount of hemorrhage tracks along the distal right iliopsoas muscle. There is degenerative change of the right hip with probable focus of avascular necrosis with femoral head. Mild compression deformity of T12 appears chronic. There is degenerative change throughout the lumbar spine.  IMPRESSION: 1. Findings suspicious for pyelonephritis of the upper left kidney. 2. Comminuted mildly displaced right proximal femur fracture, primarily intertrochanteric with the subtrochanteric component. Associated hemorrhage, some of which tracks in the distal iliopsoas muscle.   Electronically Signed   By: Rubye Oaks M.D.   On: 04/17/2014 03:45   Dg Chest 2 View  04/17/2014   CLINICAL DATA:  Altered mental status, abdominal pain.  EXAM: CHEST  2 VIEW  COMPARISON:  07/13/2011  FINDINGS: There is hyperinflation of the lungs compatible with COPD. Heart and mediastinal contours are within normal limits. No focal opacities or effusions. No acute bony abnormality.  IMPRESSION: COPD.  No active disease.   Electronically Signed   By: Charlett Nose M.D.   On: 04/17/2014 02:15   Ct Head Wo Contrast  04/17/2014   CLINICAL DATA:  Altered mental status.  Possible fall.  EXAM: CT HEAD WITHOUT CONTRAST  TECHNIQUE: Contiguous axial images were obtained from the base of the skull through the vertex without intravenous contrast.  COMPARISON:  None.  FINDINGS: Age-related atrophy and chronic small vessel ischemia. No intracranial hemorrhage, mass effect, or midline shift. No hydrocephalus. The basilar cisterns are  patent. No evidence of territorial infarct. No intracranial fluid collection. Calvarium is intact. Minimal mucosal thickening of left ethmoid air cell. Minimal opacification of lower left mastoid air cells.  IMPRESSION: Age-related atrophy and chronic small vessel ischemic change. No acute intracranial abnormality.   Electronically Signed   By: Rubye Oaks M.D.   On: 04/17/2014 03:28   Dg Hip Unilat With Pelvis 2-3 Views Right  04/17/2014   CLINICAL DATA:  Right hip pain, no longer ambulatory.  EXAM: RIGHT HIP (WITH PELVIS) 2-3 VIEWS  COMPARISON:  None.  FINDINGS: There is a comminuted displaced fracture of the right proximal femur. This is primarily intertrochanteric and sub trochanteric with involvement of the lesser trochanter. Mild apex lateral angulation and foreshortening. No additional acute fracture. There is degenerative change of the right hip. Calcified uterine fibroids are noted.  IMPRESSION: Comminuted proximal femur fracture involving the intertrochanteric and subtrochanteric femur.   Electronically Signed   By: Lujean Rave.D.  On: 04/17/2014 03:23      ASSESSMENT / PLAN:  PULMONARY A:  No acute issues. P:   Monitor respiratory status  CARDIOVASCULAR A:  Hx of HTN, HLD. P:  Continue lopressor, zocor Hold outpt benazepril Hold ASA, SQ heparin due to bleeding at hip fx site  RENAL A:   AKI >> improving. Hx of Stage 2 CKD. Severe hyponatremia in setting of hypovolemia >> likely from diuretic use >> improving. Lactic acidosis >> improved. Non anion gap metabolic acidosis. Hypokalemia. P:   Continue NS IV fluid BMET q6h Replace electrolytes as needed  GASTROINTESTINAL A:   Nausea with vomiting >> improved 3/27. Hx of chronic diarrhea. Hx of GERD. P:   Continue diet Protonix for SUP  HEMATOLOGIC A:   Anemia, thrombocytopenia of critical illness. P:  F/u CBC SCD's for DVT prevention  INFECTIOUS A:   Sepsis from pyelonephritis. P:   Day 2  rocephin  Blood 3/26 >>  ENDOCRINE A:   Hyperglycemia >> improved. P:   Monitor blood sugars on BMET  NEUROLOGIC A:  Acute encephalopathy 2nd to hyponatremia, AKI, sepsis. P:   Monitor mental status  ORTHOPEDICS A: Rt hip intertrochanteric/subtrochanteric fracture. P: Ortho to proceed with nailing when medically stable   Coralyn Helling, MD [facility] Pulmonary/Critical Care 04/18/2014, 10:26 AM Pager:  [phone] After 3pm call: [phone]

## 2014-04-18 NOTE — Progress Notes (Signed)
Patient ID: Kimberly Avery, female   DOB: [birth year], 81 y.o.   MRN: [id number] Subjective: Stable in room with nurse, no family in room this am Confused  Relatively comfortable with Tramadol only per nursing  Objective:   VITALS:   Filed Vitals:   04/18/14 0800  BP:   Pulse:   Temp: 97.5 F (36.4 C)  Resp:     physical exam: Constant movement and activity with hands Right leg shortened and externally rotated  LABS  Recent Labs  04/17/14 0146  04/17/14 0417 04/17/14 0555 04/18/14 0812  HGB 11.2*  < > 7.8* 8.3* 7.5*  HCT 31.3*  < > 23.0* 22.5* 20.8*  WBC 8.5  --   --  13.3* 12.7*  PLT 143*  --   --  110* 118*  < > = values in this interval not displayed.   Recent Labs  04/17/14 1900 04/17/14 2307 04/18/14 0353  NA 112* 112* 115*  K 4.1 3.7 3.8  BUN 31* 33* 34*  CREATININE 1.52* 1.45* 1.46*  GLUCOSE 104* 122* 101*     Recent Labs  04/17/14 0146  INR 1.08     Assessment/Plan:   Right peri-trochanteric femur fracture Significant hyponatremia with associated delirium      Plan: Will need ORIF of right hip to restore function of right hip Will wait until medically stabilized and cleared Will discuss with family plans when ever to discuss indications, risks and benefits

## 2014-04-19 DIAGNOSIS — E871 Hypo-osmolality and hyponatremia: Secondary | ICD-10-CM

## 2014-04-19 LAB — BASIC METABOLIC PANEL
ANION GAP: 10 (ref 5–15)
Anion gap: 10 (ref 5–15)
Anion gap: 11 (ref 5–15)
Anion gap: 9 (ref 5–15)
BUN: 35 mg/dL — ABNORMAL HIGH (ref 6–23)
BUN: 35 mg/dL — AB (ref 6–23)
BUN: 35 mg/dL — ABNORMAL HIGH (ref 6–23)
BUN: 34 mg/dL — ABNORMAL HIGH (ref 6–23)
CALCIUM: 6.9 mg/dL — AB (ref 8.4–10.5)
CHLORIDE: 93 mmol/L — AB (ref 96–112)
CHLORIDE: 94 mmol/L — AB (ref 96–112)
CHLORIDE: 99 mmol/L (ref 96–112)
CO2: 18 mmol/L — ABNORMAL LOW (ref 19–32)
CO2: 18 mmol/L — AB (ref 19–32)
CO2: 18 mmol/L — ABNORMAL LOW (ref 19–32)
CO2: 17 mmol/L — ABNORMAL LOW (ref 19–32)
CREATININE: 1.25 mg/dL — AB (ref 0.50–1.10)
Calcium: 7.1 mg/dL — ABNORMAL LOW (ref 8.4–10.5)
Calcium: 7.3 mg/dL — ABNORMAL LOW (ref 8.4–10.5)
Calcium: 7.1 mg/dL — ABNORMAL LOW (ref 8.4–10.5)
Chloride: 92 mmol/L — ABNORMAL LOW (ref 96–112)
Creatinine, Ser: 1.39 mg/dL — ABNORMAL HIGH (ref 0.50–1.10)
Creatinine, Ser: 1.4 mg/dL — ABNORMAL HIGH (ref 0.50–1.10)
Creatinine, Ser: 1.25 mg/dL — ABNORMAL HIGH (ref 0.50–1.10)
GFR calc Af Amer: 38 mL/min — ABNORMAL LOW (ref >90)
GFR calc Af Amer: 37 mL/min — ABNORMAL LOW (ref >90)
GFR calc non Af Amer: 32 mL/min — ABNORMAL LOW (ref >90)
GFR calc non Af Amer: 37 mL/min — ABNORMAL LOW (ref >90)
GFR, EST AFRICAN AMERICAN: 43 mL/min — AB (ref >90)
GFR, EST AFRICAN AMERICAN: 43 mL/min — AB (ref >90)
GFR, EST NON AFRICAN AMERICAN: 33 mL/min — AB (ref >90)
GFR, EST NON AFRICAN AMERICAN: 37 mL/min — AB (ref >90)
GLUCOSE: 162 mg/dL — AB (ref 70–99)
GLUCOSE: 152 mg/dL — AB (ref 70–99)
GLUCOSE: 135 mg/dL — AB (ref 70–99)
Glucose, Bld: 126 mg/dL — ABNORMAL HIGH (ref 70–99)
POTASSIUM: 3.7 mmol/L (ref 3.5–5.1)
POTASSIUM: 3.2 mmol/L — AB (ref 3.5–5.1)
POTASSIUM: 3 mmol/L — AB (ref 3.5–5.1)
Potassium: 3 mmol/L — ABNORMAL LOW (ref 3.5–5.1)
SODIUM: 121 mmol/L — AB (ref 135–145)
SODIUM: 122 mmol/L — AB (ref 135–145)
Sodium: 121 mmol/L — ABNORMAL LOW (ref 135–145)
Sodium: 125 mmol/L — ABNORMAL LOW (ref 135–145)

## 2014-04-19 LAB — CBC
HEMATOCRIT: 19.2 % — AB (ref 36.0–46.0)
Hemoglobin: 7 g/dL — ABNORMAL LOW (ref 12.0–15.0)
MCH: 28.6 pg (ref 26.0–34.0)
MCHC: 36.5 g/dL — ABNORMAL HIGH (ref 30.0–36.0)
MCV: 78.4 fL (ref 78.0–100.0)
PLATELETS: 112 10*3/uL — AB (ref 150–400)
RBC: 2.45 MIL/uL — ABNORMAL LOW (ref 3.87–5.11)
RDW: 13 % (ref 11.5–15.5)
WBC: 13.3 10*3/uL — AB (ref 4.0–10.5)

## 2014-04-19 MED ORDER — POTASSIUM CHLORIDE 20 MEQ/15ML (10%) PO SOLN
40.0000 meq | Freq: Once | ORAL | Status: AC
  Administered 2014-04-19: 40 meq via ORAL
  Filled 2014-04-19: qty 30

## 2014-04-19 MED ORDER — ENSURE ENLIVE PO LIQD
237.0000 mL | Freq: Two times a day (BID) | ORAL | Status: DC
  Administered 2014-04-20 – 2014-04-24 (×6): 237 mL via ORAL

## 2014-04-19 MED ORDER — POTASSIUM CHLORIDE CRYS ER 20 MEQ PO TBCR
40.0000 meq | EXTENDED_RELEASE_TABLET | Freq: Once | ORAL | Status: AC
  Administered 2014-04-19: 40 meq via ORAL
  Filled 2014-04-19: qty 2

## 2014-04-19 MED ORDER — HALOPERIDOL LACTATE 5 MG/ML IJ SOLN
4.0000 mg | Freq: Once | INTRAMUSCULAR | Status: AC
Start: 2014-04-19 — End: 2014-04-19
  Administered 2014-04-19: 4 mg via INTRAVENOUS
  Filled 2014-04-19: qty 1

## 2014-04-19 NOTE — Progress Notes (Signed)
PULMONARY / CRITICAL CARE MEDICINE   Name: Kimberly Avery MRN: [id number] DOB: 1923-01-31    ADMISSION DATE:  04/17/2014  REFERRING MD :  ED  CHIEF COMPLAINT:  weakness  INITIAL PRESENTATION:  81 yo female brought to ER with vomiting, weakness, abdominal pain, lethargy.  She was found to have severe hyponatremia, AKI, Rt femoral fx, and elevated lactic acid.  STUDIES:  3/26 CT abd >> pyelonephritis Lt kidney, comminuted and mildly displaced Rt proximal femur fx with hemorrhage tracking to distal iliopsoas muscle  SIGNIFICANT EVENTS: 3/26 Admit, ortho consulted 04/18/2014: More alert.  C/o Rt hip pain.  Does not remember being in hospital yesterday.   SUBJECTIVE/OVERNIGHT/INTERVAL HX 04/19/14:  On normal saline at 50cc/h Na now 121and improved (107 at admit 04/17/2014 ) Na stuck at 121 x 12h . Creat down to 1.4. On Q6h bmet check - agitated intermittently and tries to get up. Has no recollection. Sittterat bedside  VITAL SIGNS: Temp:  [97.4 F (36.3 C)-98.3 F (36.8 C)] 98.3 F (36.8 C) (03/28 0000) Pulse Rate:  [32-139] 139 (03/28 0800) Resp:  [10-20] 14 (03/28 0900) BP: (94-205)/(22-158) 112/50 mmHg (03/28 1000) SpO2:  [94 %-100 %] 94 % (03/28 0800) Weight:  [45.3 kg (99 lb 13.9 oz)] 45.3 kg (99 lb 13.9 oz) (03/28 0600) INTAKE / OUTPUT:  Intake/Output Summary (Last 24 hours) at 04/19/14 1043 Last data filed at 04/19/14 1000  Gross per 24 hour  Intake 1299.17 ml  Output   2050 ml  Net -750.83 ml    PHYSICAL EXAMINATION: General: thin Neuro:  Awake, follows commands, more interactive, able to state her name. Sitting. Confused. Intermittently agiated HEENT:  Pupils reactive Cardiovascular: irregular Lungs: no wheeze Abdomen:  Soft, nontender Musculoskeletal:  Right leg is externally rotated, no leg edema Skin:  No ecchymosis  LABS:   PULMONARY  Recent Labs Lab 04/17/14 0152 04/17/14 0417 04/17/14 0435  PHART  --   --  7.425  PCO2ART  --   --  24.5*   PO2ART  --   --  141.0*  HCO3  --   --  15.9*  TCO2 13 15 15.0  O2SAT  --   --  98.4    CBC  Recent Labs Lab 04/17/14 0555 04/18/14 0812 04/19/14 0250  HGB 8.3* 7.5* 7.0*  HCT 22.5* 20.8* 19.2*  WBC 13.3* 12.7* 13.3*  PLT 110* 118* 112*    COAGULATION  Recent Labs Lab 04/17/14 0146  INR 1.08    CARDIAC  No results for input(s): TROPONINI in the last 168 hours. No results for input(s): PROBNP in the last 168 hours.   CHEMISTRY  Recent Labs Lab 04/17/14 0146  04/17/14 0555  04/18/14 0812 04/18/14 1330 04/18/14 2050 04/19/14 0250 04/19/14 0834  NA 107*  < > 110*  < > 117* 119* 121* 121* 121*  K 2.8*  < > 3.4*  < > 3.5 3.5 3.0* 3.7 3.2*  CL 70*  < > 80*  < > 90* 92* 92* 93* 92*  CO2 18*  --  16*  < > 18* 16* 19 18* 18*  GLUCOSE 233*  < > 130*  < > 102* 98 116* 162* 152*  BUN 30*  < > 29*  < > 30* 32* 35* 35* 35*  CREATININE 1.64*  < > 1.54*  < > 1.47* 1.48* 1.45* 1.39* 1.40*  CALCIUM 7.9*  --  6.6*  < > 6.9* 7.0* 7.2* 7.1* 6.9*  MG 1.9  --  2.1  --  QUANTITY NOT SUFFICIENT, UNABLE TO PERFORM TEST  --   --   --   --   PHOS  --   --  3.3  --  QUANTITY NOT SUFFICIENT, UNABLE TO PERFORM TEST  --   --   --   --   < > = values in this interval not displayed. Estimated Creatinine Clearance: 19.5 mL/min (by C-G formula based on Cr of 1.4).   LIVER  Recent Labs Lab 04/17/14 0146 04/18/14 0812  AST 125* 167*  ALT 38* 49*  ALKPHOS 63 46  BILITOT 1.0 0.6  PROT 6.6 5.2*  ALBUMIN 3.7 3.0*  INR 1.08  --      INFECTIOUS  Recent Labs Lab 04/17/14 0414 04/17/14 0628 04/17/14 1529  LATICACIDVEN 5.70* 3.7* 2.3*     ENDOCRINE CBG (last 3)   Recent Labs  04/17/14 1229 04/17/14 1705 04/18/14 0811  GLUCAP 105* 120* 108*         IMAGING x48h No results found.     ASSESSMENT / PLAN:  PULMONARY A:  No acute issues. P:   Monitor respiratory status  CARDIOVASCULAR A:  Hx of HTN, HLD.   - EKG prolonged Qtc 04/18/14 P:  Check  trop Repeat EKG 04/20/2014 Continue lopressor, zocor Hold outpt benazepril 0 not needed at discharge Hold ASA, SQ heparin due to bleeding at hip fx site SCDs  RENAL A:   AKI >> improving. Hx of Stage 2 CKD. Severe hyponatremia in setting of hypovolemia >> likely from diuretic use >> improving. Lactic acidosis >> improved. Non anion gap metabolic acidosis. Hypokalemia   - Na corrected 0.39/h x 48h but non changed x 12h . P:   Continue NS IV fluid but increase from 50cc/h to 100cc/h - slow correction only /h hour BMET q6h Replace electrolytes as neededl KCL via po 04/19/2014   GASTROINTESTINAL A:   Nausea with vomiting >> improved 3/27. Hx of chronic diarrhea. Hx of GERD. P:   Continue diet Protonix for SUP  HEMATOLOGIC A:   Anemia, thrombocytopenia of critical illness. P:  F/u CBC SCD's for DVT prevention  INFECTIOUS A:   Sepsis from pyelonephritis. P:   Day 3 rocephin  Blood 3/26 >>  ENDOCRINE A:   Hyperglycemia >> improved. P:   Monitor blood sugars on BMET  NEUROLOGIC A:  Acute encephalopathy 2nd to hyponatremia, AKI, sepsis.   - improved but still intermittently agitated. Has perioidic agitation due to dementia P:   Monitor mental status Fent prn for pain No hahdlol (QTc )    ORTHOPEDICS A: Rt hip intertrochanteric/subtrochanteric fracture. P: Ortho to proceed with nailing when medically stable - awaiting NA to > 130 - agree with this   No family at bedside 04/19/2014 . Will give to [facility] 04/20/14  Change to SDU 04/19/2014   Dr. Kalman Shan, M.D., [facility].C.P Pulmonary and Critical Care Medicine Staff Physician [facility] Pulmonary and Critical Care Pager: [phone], If no answer or between  15:00h - 7:00h: call [phone]  04/19/2014 10:43 AM

## 2014-04-19 NOTE — Progress Notes (Signed)
Patient ID: Kimberly Avery, female   DOB: 02/06/1923, 81 y.o.   MRN: [id number] Subjective:     Patient still very confused, requiring Haldol last night to calm her  Objective:   VITALS:   Filed Vitals:   04/19/14 0600  BP: 115/25  Pulse: 59  Temp:   Resp: 10    physical exam: Sleeping this am RLE unchanged  LABS  Recent Labs  04/17/14 0555 04/18/14 0812 04/19/14 0250  HGB 8.3* 7.5* 7.0*  HCT 22.5* 20.8* 19.2*  WBC 13.3* 12.7* 13.3*  PLT 110* 118* 112*     Recent Labs  04/18/14 1330 04/18/14 2050 04/19/14 0250  NA 119* 121* 121*  K 3.5 3.0* 3.7  BUN 32* 35* 35*  CREATININE 1.48* 1.45* 1.39*  GLUCOSE 98 116* 162*     Recent Labs  04/17/14 0146  INR 1.08     Assessment/Plan: Right peri-trochanteric hip fracture    Plan: Pending medical clearance as well as input from anesthesiology regarding her hyponatremia Will need ORIF for fracture stabilization for function Continue current course until directed by critical care team

## 2014-04-19 NOTE — Progress Notes (Signed)
Pt very restless and agitated, attempting to get out of bed and pulling at lines: foley, telemetry, SCD's. Pt also was aggressive, pulled the IV pole down and hit and kick at staff. MD was notified. Order was given for Haldol. RN and NT at bedside attempting to redirect and encourage patient to stay in bed.

## 2014-04-19 NOTE — Progress Notes (Signed)
eLink Physician-Brief Progress Note Patient Name: Kimberly Avery DOB: [birth year] MRN: [id number]   Date of Service  04/19/2014  HPI/Events of Note  Hypokalemia (K+ = 3.0) and Creatinine = 1.25.  eICU Interventions  Will replete potassium.      Intervention Category Intermediate Interventions: Electrolyte abnormality - evaluation and management  [name] 04/19/2014, 7:21 PM

## 2014-04-19 NOTE — Progress Notes (Signed)
INITIAL NUTRITION ASSESSMENT  DOCUMENTATION CODES Per approved criteria  -Severe malnutrition in the context of chronic illness -Underweight  Pt meets criteria for severe MALNUTRITION in the context of chronic illness as evidenced by severe fat and muscle wasting.  INTERVENTION: - Magic cup TID with meals, each supplement provides 290 kcal and 9 grams of protein - Ensure Enlive po BID, each supplement provides 350 kcal and 20 grams of protein  NUTRITION DIAGNOSIS: Inadequate oral intake related to weakness and dementia as evidenced by poor po.   Goal: Pt to meet >/= 90% of their estimated nutrition needs   Monitor:  Weight trend, po intake, acceptance of supplements, labs  Reason for Assessment: Low BMI  81 y.o. female  Admitting Dx: <principal problem not specified>  ASSESSMENT: 28F who lives alone, apparently was doing well until yesterday morning when she called her daughter and saying that she didn't feel well. Had chronic diarrhea but that morning also had some greenish vomiting and abd discomfort. No witnessed fall, no witnessed seizure. Due to her progressive weakness and lethargy she was brough to the ED and found to have severe hyponatremia, renal failure, right femoral fracture and high lactate. She is more confused than usual.  - Pt with femoral fracture. To undergo surgical repair once labs improve (Na specifically.) - Pt confused during RD visit. Nutritional history obtained from RN, Nurse Tech, and medical chart.  - Per nurse tech, pt ate breakfast this morning. She had eggs, sausage, a few bites of grits and a few bites of fruit. She is not drinking very much liquid.  - RD to order nutritional supplements.   - Labs Na low 121 K low 3.2 BUN low 18  Nutrition Focused Physical Exam:  Subcutaneous Fat:  Orbital Region: severe depletion Upper Arm Region: severe depletion Thoracic and Lumbar Region: n/a  Muscle:  Temple Region: severe depletion Clavicle Bone  Region: severe depletion Clavicle and Acromion Bone Region: severe depletion Scapular Bone Region: n/a Dorsal Hand: severe depletion Patellar Region: WNL Anterior Thigh Region: WNL Posterior Calf Region: WNL  Height: Ht Readings from Last 1 Encounters:  04/17/14 5\' 6"  (1.676 m)    Weight: Wt Readings from Last 1 Encounters:  04/19/14 99 lb 13.9 oz (45.3 kg)    Ideal Body Weight: 130 lbs  % Ideal Body Weight: 76%  Wt Readings from Last 10 Encounters:  04/19/14 99 lb 13.9 oz (45.3 kg)  04/02/14 95 lb (43.092 kg)  11/19/13 94 lb 14.4 oz (43.046 kg)  10/06/13 96 lb 12.8 oz (43.908 kg)  09/21/13 91 lb 6.4 oz (41.459 kg)    BMI:  Body mass index is 16.13 kg/(m^2).  Estimated Nutritional Needs: Kcal: 1400-1600 Protein: 65-75 g Fluid: >1.6 L/day  Skin: intact  Diet Order: Diet regular Room service appropriate?: Yes; Fluid consistency:: Thin  EDUCATION NEEDS: -Education not appropriate at this time   Intake/Output Summary (Last 24 hours) at 04/19/14 1236 Last data filed at 04/19/14 1000  Gross per 24 hour  Intake 1399.17 ml  Output   1650 ml  Net -250.83 ml    Last BM: prior to admission   Labs:   Recent Labs Lab 04/17/14 0146  04/17/14 0555  04/18/14 0812  04/18/14 2050 04/19/14 0250 04/19/14 0834  NA 107*  < > 110*  < > 117*  < > 121* 121* 121*  K 2.8*  < > 3.4*  < > 3.5  < > 3.0* 3.7 3.2*  CL 70*  < > 80*  < >  90*  < > 92* 93* 92*  CO2 18*  --  16*  < > 18*  < > 19 18* 18*  BUN 30*  < > 29*  < > 30*  < > 35* 35* 35*  CREATININE 1.64*  < > 1.54*  < > 1.47*  < > 1.45* 1.39* 1.40*  CALCIUM 7.9*  --  6.6*  < > 6.9*  < > 7.2* 7.1* 6.9*  MG 1.9  --  2.1  --  QUANTITY NOT SUFFICIENT, UNABLE TO PERFORM TEST  --   --   --   --   PHOS  --   --  3.3  --  QUANTITY NOT SUFFICIENT, UNABLE TO PERFORM TEST  --   --   --   --   GLUCOSE 233*  < > 130*  < > 102*  < > 116* 162* 152*  < > = values in this interval not displayed.  CBG (last 3)   Recent Labs   04/17/14 1229 04/17/14 1705 04/18/14 0811  GLUCAP 105* 120* 108*    Scheduled Meds: . cefTRIAXone (ROCEPHIN)  IV  1 g Intravenous Q24H  . dorzolamide-timolol  1 drop Both Eyes BID  . metoprolol tartrate  12.5 mg Oral BID  . pantoprazole  40 mg Oral Daily  . potassium chloride  40 mEq Oral Once  . simvastatin  20 mg Oral q1800    Continuous Infusions: . sodium chloride 100 mL/hr at 04/19/14 1222    Past Medical History  Diagnosis Date  . Hypertension   . Hyperlipidemia   . Gout   . Osteoporosis   . CKD (chronic kidney disease) stage 4, GFR 15-29 ml/min   . Glaucoma   . Vitamin D deficiency     Past Surgical History  Procedure Laterality Date  . Breast surgery Bilateral 1970    mastectomies - fiboradenoma  . Eye surgery  2003, 2004  . Temporal artery biopsy / ligation  [address] [place], [place], [place] [phone]

## 2014-04-20 ENCOUNTER — Encounter (HOSPITAL_COMMUNITY)
Admission: EM | Disposition: A | Discharge status: Skilled Nursing Facility | Payer: Self-pay | Source: Home / Self Care | Attending: Internal Medicine

## 2014-04-20 DIAGNOSIS — E43 Unspecified severe protein-calorie malnutrition: Secondary | ICD-10-CM | POA: Diagnosis present

## 2014-04-20 DIAGNOSIS — R778 Other specified abnormalities of plasma proteins: Secondary | ICD-10-CM | POA: Clinically undetermined

## 2014-04-20 DIAGNOSIS — D62 Acute posthemorrhagic anemia: Secondary | ICD-10-CM | POA: Diagnosis present

## 2014-04-20 DIAGNOSIS — S72141A Displaced intertrochanteric fracture of right femur, initial encounter for closed fracture: Secondary | ICD-10-CM | POA: Diagnosis present

## 2014-04-20 DIAGNOSIS — N12 Tubulo-interstitial nephritis, not specified as acute or chronic: Secondary | ICD-10-CM | POA: Diagnosis present

## 2014-04-20 DIAGNOSIS — N183 Chronic kidney disease, stage 3 unspecified: Secondary | ICD-10-CM | POA: Diagnosis present

## 2014-04-20 DIAGNOSIS — E872 Acidosis, unspecified: Secondary | ICD-10-CM | POA: Diagnosis present

## 2014-04-20 DIAGNOSIS — R7989 Other specified abnormal findings of blood chemistry: Secondary | ICD-10-CM

## 2014-04-20 DIAGNOSIS — I350 Nonrheumatic aortic (valve) stenosis: Secondary | ICD-10-CM

## 2014-04-20 DIAGNOSIS — D696 Thrombocytopenia, unspecified: Secondary | ICD-10-CM

## 2014-04-20 DIAGNOSIS — A419 Sepsis, unspecified organism: Secondary | ICD-10-CM | POA: Diagnosis present

## 2014-04-20 LAB — BASIC METABOLIC PANEL
ANION GAP: 7 (ref 5–15)
ANION GAP: 8 (ref 5–15)
Anion gap: 6 (ref 5–15)
BUN: 31 mg/dL — AB (ref 6–23)
BUN: 26 mg/dL — AB (ref 6–23)
BUN: 23 mg/dL (ref 6–23)
CALCIUM: 7.5 mg/dL — AB (ref 8.4–10.5)
CO2: 20 mmol/L (ref 19–32)
CO2: 17 mmol/L — ABNORMAL LOW (ref 19–32)
CO2: 21 mmol/L (ref 19–32)
CREATININE: 1.18 mg/dL — AB (ref 0.50–1.10)
Calcium: 7.3 mg/dL — ABNORMAL LOW (ref 8.4–10.5)
Calcium: 7.6 mg/dL — ABNORMAL LOW (ref 8.4–10.5)
Chloride: 100 mmol/L (ref 96–112)
Chloride: 106 mmol/L (ref 96–112)
Chloride: 104 mmol/L (ref 96–112)
Creatinine, Ser: 1.23 mg/dL — ABNORMAL HIGH (ref 0.50–1.10)
Creatinine, Ser: 1.23 mg/dL — ABNORMAL HIGH (ref 0.50–1.10)
GFR calc Af Amer: 44 mL/min — ABNORMAL LOW (ref >90)
GFR calc Af Amer: 44 mL/min — ABNORMAL LOW (ref >90)
GFR calc non Af Amer: 38 mL/min — ABNORMAL LOW (ref >90)
GFR, EST AFRICAN AMERICAN: 46 mL/min — AB (ref >90)
GFR, EST NON AFRICAN AMERICAN: 40 mL/min — AB (ref >90)
GFR, EST NON AFRICAN AMERICAN: 38 mL/min — AB (ref >90)
Glucose, Bld: 134 mg/dL — ABNORMAL HIGH (ref 70–99)
Glucose, Bld: 139 mg/dL — ABNORMAL HIGH (ref 70–99)
Glucose, Bld: 128 mg/dL — ABNORMAL HIGH (ref 70–99)
POTASSIUM: 3.2 mmol/L — AB (ref 3.5–5.1)
Potassium: 3.6 mmol/L (ref 3.5–5.1)
Potassium: 4.5 mmol/L (ref 3.5–5.1)
SODIUM: 131 mmol/L — AB (ref 135–145)
Sodium: 127 mmol/L — ABNORMAL LOW (ref 135–145)
Sodium: 131 mmol/L — ABNORMAL LOW (ref 135–145)

## 2014-04-20 LAB — CBC
HEMATOCRIT: 20.1 % — AB (ref 36.0–46.0)
HEMOGLOBIN: 7.1 g/dL — AB (ref 12.0–15.0)
MCH: 28.2 pg (ref 26.0–34.0)
MCHC: 35.3 g/dL (ref 30.0–36.0)
MCV: 79.8 fL (ref 78.0–100.0)
Platelets: 160 10*3/uL (ref 150–400)
RBC: 2.52 MIL/uL — AB (ref 3.87–5.11)
RDW: 13.5 % (ref 11.5–15.5)
WBC: 10.1 10*3/uL (ref 4.0–10.5)

## 2014-04-20 LAB — IRON AND TIBC
Iron: 25 ug/dL — ABNORMAL LOW (ref 42–145)
Saturation Ratios: 9 % — ABNORMAL LOW (ref 20–55)
TIBC: 276 ug/dL (ref 250–470)
UIBC: 251 ug/dL (ref 125–400)

## 2014-04-20 LAB — PREPARE RBC (CROSSMATCH)

## 2014-04-20 LAB — MAGNESIUM: Magnesium: 2.2 mg/dL (ref 1.5–2.5)

## 2014-04-20 LAB — CLOSTRIDIUM DIFFICILE BY PCR: CDIFFPCR: NEGATIVE

## 2014-04-20 LAB — RETICULOCYTES
RBC.: 2.49 MIL/uL — AB (ref 3.87–5.11)
RETIC COUNT ABSOLUTE: 89.6 10*3/uL (ref 19.0–186.0)
RETIC CT PCT: 3.6 % — AB (ref 0.4–3.1)

## 2014-04-20 LAB — TROPONIN I
TROPONIN I: 0.18 ng/mL — AB (ref <0.031)
Troponin I: 0.22 ng/mL — ABNORMAL HIGH (ref <0.031)

## 2014-04-20 LAB — FOLATE: FOLATE: 11.9 ng/mL

## 2014-04-20 LAB — PHOSPHORUS: Phosphorus: 1.6 mg/dL — ABNORMAL LOW (ref 2.3–4.6)

## 2014-04-20 LAB — LACTIC ACID, PLASMA: Lactic Acid, Venous: 1.2 mmol/L (ref 0.5–2.0)

## 2014-04-20 LAB — VITAMIN B12

## 2014-04-20 LAB — ABO/RH: ABO/RH(D): O POS

## 2014-04-20 LAB — FERRITIN: FERRITIN: 165 ng/mL (ref 10–291)

## 2014-04-20 SURGERY — OPEN REDUCTION INTERNAL FIXATION (ORIF) DISTAL FEMUR FRACTURE
Anesthesia: Choice | Laterality: Right

## 2014-04-20 MED ORDER — SODIUM CHLORIDE 0.9 % IV SOLN
Freq: Once | INTRAVENOUS | Status: AC
  Administered 2014-04-20: 16:00:00 via INTRAVENOUS

## 2014-04-20 MED ORDER — DEXTROSE 5 % IV SOLN
10.0000 meq | Freq: Once | INTRAVENOUS | Status: AC
  Administered 2014-04-20: 10 meq via INTRAVENOUS
  Filled 2014-04-20 (×2): qty 2.27

## 2014-04-20 MED ORDER — VITAMINS A & D EX OINT
TOPICAL_OINTMENT | CUTANEOUS | Status: AC
  Filled 2014-04-20: qty 5

## 2014-04-20 MED ORDER — POTASSIUM CHLORIDE 20 MEQ/15ML (10%) PO SOLN
40.0000 meq | Freq: Once | ORAL | Status: AC
  Administered 2014-04-20: 40 meq via ORAL
  Filled 2014-04-20: qty 30

## 2014-04-20 NOTE — Progress Notes (Signed)
  Echocardiogram 2D Echocardiogram has been performed.  Janalyn HarderWest, Ramya Vanbergen R 04/20/2014, 4:43 PM

## 2014-04-20 NOTE — Progress Notes (Addendum)
PROGRESS NOTE  Kimberly Avery JYN:[id number] DOB: [birth year] DOA: 04/17/2014 PCP: Hollice EspyGATES,DONNA RUTH, MD  HPI: 81 yo female brought to ER with vomiting, weakness, abdominal pain, lethargy. She was found to have severe hyponatremia, AKI, Rt femoral fx, and elevated lactic acid.  SIGNIFICANT EVENTS: 3/26 Admit, ortho consulted 3/27 More alert. C/o Rt hip pain. Does not remember being in hospital yesterday. 3/29 [facility] assumed care  Subjective / 24 H Interval events She is feeling well this morning, denies chest pain/dyspnea, denies abdominal pain, nausea. Appetite good, eating well   Assessment/Plan: Active Problems:   Hyponatremia   Protein-calorie malnutrition, severe   CKD (chronic kidney disease), stage III   Acute blood loss anemia   Thrombocytopenia   Intertrochanteric fracture of right hip   Elevated troponin   Lactic acidosis   Sepsis   Pyelonephritis   Severe hyponatremia  - in the setting of hypovolemia  - from diuretic use, she is on Indapamide - improved with IV fluids, Na 127 this morning, continue to monitor with serial BMPs  Right hip fracture - wound hold surgery until Na > 130, troponin has more data points and a 2D echo is obtained - with associated hemorrhage, some tracking in the distal iliopsoas muscle - of 2D echo OK, should be able to have surgery 3/30. She is at moderate risk.   AKI  - CKD III - renal function improving with hydration  Elevated troponin - unclear why obtained in the first place, patient without chest pain or cardiac symptoms - will trend, obtain a 2D echo - hold hip surgery until echo back  Lactic acidosis - resolved  NAGMA - in the setting of renal failure, bicarbonate 20   Hypokalemia - replete and monitor  HTN - continue Metoprolol  HLD  - Simvastatin  Anemia, acute blood loss - Hb 7.0 yesterday, associated hemorrhage at the hip site, repeat today 7.1, transfuse 1U  Thrombocytopenia - in the setting of  acute illness, no prior values available, now back within normal range  Sepsis from pyelonephritis  - cultures negative, continue to treat for now however  Acute encephalopathy - improving   Diet: Diet regular Room service appropriate?: Yes; Fluid consistency:: Thin Fluids: NS 100 cc/h DVT Prophylaxis: SCD  Code Status: Full Code Family Communication: d/w patient  Disposition Plan: remain in SDU  Consultants:  Orthopedic surgery   Procedures:  None    Antibiotics Ceftriaxone 3/26 >>   Studies  No results found.  Objective  Filed Vitals:   04/20/14 0026 04/20/14 0400 04/20/14 0433 04/20/14 0448  BP:  149/53    Pulse:  68    Temp: 98.5 F (36.9 C)  98.7 F (37.1 C)   TempSrc: Oral  Oral   Resp:  13    Height:      Weight:    44.9 kg (98 lb 15.8 oz)  SpO2:  99%      Intake/Output Summary (Last 24 hours) at 04/20/14 0655 Last data filed at 04/20/14 0600  Gross per 24 hour  Intake 3209.17 ml  Output   2201 ml  Net 1008.17 ml   Filed Weights   04/18/14 0616 04/19/14 0600 04/20/14 0448  Weight: 45.7 kg (100 lb 12 oz) 45.3 kg (99 lb 13.9 oz) 44.9 kg (98 lb 15.8 oz)    Exam:  General:  NAD, pleasant  HEENT: no scleral icterus, PERRL  Cardiovascular: RRR without MRG, 2+ peripheral pulses, no edema  Respiratory: CTA biL, good air movement, no  wheezing, no crackles, no rales  Abdomen: soft, non tender, BS +, no guarding  MSK/Extremities: no clubbing/cyanosis, no joint swelling  Skin: no rashes  Neuro: non focal  Data Reviewed: Basic Metabolic Panel:  Recent Labs Lab 04/17/14 0146  04/17/14 0555  04/18/14 0812  04/19/14 0250 04/19/14 0834 04/19/14 1520 04/19/14 2100 04/20/14 0340  NA 107*  < > 110*  < > 117*  < > 121* 121* 122* 125* 127*  K 2.8*  < > 3.4*  < > 3.5  < > 3.7 3.2* 3.0* 3.0* 3.2*  CL 70*  < > 80*  < > 90*  < > 93* 92* 94* 99 100  CO2 18*  --  16*  < > 18*  < > 18* 18* 18* 17* 20  GLUCOSE 233*  < > 130*  < > 102*  < >  162* 152* 126* 135* 134*  BUN 30*  < > 29*  < > 30*  < > 35* 35* 35* 34* 31*  CREATININE 1.64*  < > 1.54*  < > 1.47*  < > 1.39* 1.40* 1.25* 1.25* 1.18*  CALCIUM 7.9*  --  6.6*  < > 6.9*  < > 7.1* 6.9* 7.3* 7.1* 7.5*  MG 1.9  --  2.1  --  QUANTITY NOT SUFFICIENT, UNABLE TO PERFORM TEST  --   --   --   --   --  2.2  PHOS  --   --  3.3  --  QUANTITY NOT SUFFICIENT, UNABLE TO PERFORM TEST  --   --   --   --   --  1.6*  < > = values in this interval not displayed. Liver Function Tests:  Recent Labs Lab 04/17/14 0146 04/18/14 0812  AST 125* 167*  ALT 38* 49*  ALKPHOS 63 46  BILITOT 1.0 0.6  PROT 6.6 5.2*  ALBUMIN 3.7 3.0*    Recent Labs Lab 04/17/14 0146  LIPASE 42   CBC:  Recent Labs Lab 04/17/14 0146 04/17/14 0152 04/17/14 0417 04/17/14 0555 04/18/14 0812 04/19/14 0250  WBC 8.5  --   --  13.3* 12.7* 13.3*  NEUTROABS 7.2  --   --   --   --   --   HGB 11.2* 13.3 7.8* 8.3* 7.5* 7.0*  HCT 31.3* 39.0 23.0* 22.5* 20.8* 19.2*  MCV 78.3  --   --  77.3* 77.9* 78.4  PLT 143*  --   --  110* 118* 112*   Cardiac Enzymes:  Recent Labs Lab 04/20/14 0340  TROPONINI 0.22*   CBG:  Recent Labs Lab 04/17/14 0152 04/17/14 0749 04/17/14 1229 04/17/14 1705 04/18/14 0811  GLUCAP 214* 130* 105* 120* 108*    Recent Results (from the past 240 hour(s))  Urine culture     Status: None   Collection Time: 04/17/14  1:54 AM  Result Value Ref Range Status   Specimen Description URINE, CATHETERIZED  Final   Special Requests NONE  Final   Colony Count NO GROWTH Performed at [facility]   Final   Culture NO GROWTH Performed at [facility]   Final   Report Status 04/18/2014 FINAL  Final  Culture, blood (routine x 2)     Status: None (Preliminary result)   Collection Time: 04/17/14  2:37 AM  Result Value Ref Range Status   Specimen Description BLOOD R BRACHIAL  Final   Special Requests BOTTLES DRAWN AEROBIC AND ANAEROBIC  Final   Culture   Final  BLOOD CULTURE RECEIVED NO GROWTH TO DATE CULTURE WILL BE HELD FOR 5 DAYS BEFORE ISSUING A FINAL NEGATIVE REPORT Performed at [facility]    Report Status PENDING  Incomplete  MRSA PCR Screening     Status: None   Collection Time: 04/17/14  9:45 AM  Result Value Ref Range Status   MRSA by PCR NEGATIVE NEGATIVE Final    Comment:        The GeneXpert MRSA Assay (FDA approved for NASAL specimens only), is one component of a comprehensive MRSA colonization surveillance program. It is not intended to diagnose MRSA infection nor to guide or monitor treatment for MRSA infections.   Culture, blood (routine x 2)     Status: None (Preliminary result)   Collection Time: 04/17/14 10:45 AM  Result Value Ref Range Status   Specimen Description BLOOD RIGHT ARM  Final   Special Requests BOTTLES DRAWN AEROBIC ONLY 5CC AEROBIC ONLY   Final   Culture   Final           BLOOD CULTURE RECEIVED NO GROWTH TO DATE CULTURE WILL BE HELD FOR 5 DAYS BEFORE ISSUING A FINAL NEGATIVE REPORT Performed at [facility]    Report Status PENDING  Incomplete     Scheduled Meds: . cefTRIAXone (ROCEPHIN)  IV  1 g Intravenous Q24H  . dorzolamide-timolol  1 drop Both Eyes BID  . feeding supplement (ENSURE ENLIVE)  237 mL Oral BID BM  . metoprolol tartrate  12.5 mg Oral BID  . pantoprazole  40 mg Oral Daily  . simvastatin  20 mg Oral q1800   Continuous Infusions: . sodium chloride 100 mL/hr at 04/19/14 2236   Time spent: 35 minutes  Pamella Pert, MD [facility] Pager [phone]. If 7 PM - 7 AM, please contact night-coverage at [email or web address], password [facility] 04/20/2014, 6:55 AM  LOS: 3 days

## 2014-04-20 NOTE — Progress Notes (Signed)
Patient ID: [name], female   DOB: 05-Sep-1923, 81 y.o.   MRN: [id number]  Comminuted right peri-trochanteric femur fracture Severe hyponatremia and confusion upon admission  Appreciate input and guidance with regards to timing of surgery per CCM (Na >130)  Given Na of 127 this am I will continue to postpone surgery until safer I am going out of town and will have to ask one of my partners to follow her until Na >130  I hope to speak to family today to review fracture and needed repair and thus timing based in above Surgery cancelled for today Regular diet per CCM

## 2014-04-20 NOTE — Progress Notes (Signed)
Attempted to call report to nurse, nurse will return call.

## 2014-04-20 NOTE — Progress Notes (Signed)
Report called to Tiffany RN, patient and family members informed of transfer.

## 2014-04-21 ENCOUNTER — Inpatient Hospital Stay (HOSPITAL_COMMUNITY): Payer: Medicare Other | Admitting: Anesthesiology

## 2014-04-21 ENCOUNTER — Inpatient Hospital Stay (HOSPITAL_COMMUNITY): Payer: Medicare Other

## 2014-04-21 ENCOUNTER — Encounter (HOSPITAL_COMMUNITY)
Admission: EM | Disposition: A | Discharge status: Skilled Nursing Facility | Payer: Self-pay | Source: Home / Self Care | Attending: Internal Medicine

## 2014-04-21 DIAGNOSIS — S7223XA Displaced subtrochanteric fracture of unspecified femur, initial encounter for closed fracture: Secondary | ICD-10-CM | POA: Diagnosis present

## 2014-04-21 DIAGNOSIS — N183 Chronic kidney disease, stage 3 (moderate): Secondary | ICD-10-CM

## 2014-04-21 DIAGNOSIS — E43 Unspecified severe protein-calorie malnutrition: Secondary | ICD-10-CM

## 2014-04-21 DIAGNOSIS — N12 Tubulo-interstitial nephritis, not specified as acute or chronic: Secondary | ICD-10-CM

## 2014-04-21 HISTORY — PX: FEMUR IM NAIL: SHX1597

## 2014-04-21 LAB — BASIC METABOLIC PANEL
ANION GAP: 8 (ref 5–15)
ANION GAP: 9 (ref 5–15)
Anion gap: 8 (ref 5–15)
BUN: 20 mg/dL (ref 6–23)
BUN: 23 mg/dL (ref 6–23)
BUN: 17 mg/dL (ref 6–23)
CALCIUM: 7.8 mg/dL — AB (ref 8.4–10.5)
CO2: 20 mmol/L (ref 19–32)
CO2: 22 mmol/L (ref 19–32)
CO2: 19 mmol/L (ref 19–32)
CREATININE: 1.14 mg/dL — AB (ref 0.50–1.10)
Calcium: 7.7 mg/dL — ABNORMAL LOW (ref 8.4–10.5)
Calcium: 7.6 mg/dL — ABNORMAL LOW (ref 8.4–10.5)
Chloride: 104 mmol/L (ref 96–112)
Chloride: 103 mmol/L (ref 96–112)
Chloride: 107 mmol/L (ref 96–112)
Creatinine, Ser: 1.21 mg/dL — ABNORMAL HIGH (ref 0.50–1.10)
Creatinine, Ser: 1.16 mg/dL — ABNORMAL HIGH (ref 0.50–1.10)
GFR calc Af Amer: 47 mL/min — ABNORMAL LOW (ref >90)
GFR calc non Af Amer: 40 mL/min — ABNORMAL LOW (ref >90)
GFR calc non Af Amer: 41 mL/min — ABNORMAL LOW (ref >90)
GFR, EST AFRICAN AMERICAN: 45 mL/min — AB (ref >90)
GFR, EST AFRICAN AMERICAN: 48 mL/min — AB (ref >90)
GFR, EST NON AFRICAN AMERICAN: 38 mL/min — AB (ref >90)
Glucose, Bld: 136 mg/dL — ABNORMAL HIGH (ref 70–99)
Glucose, Bld: 113 mg/dL — ABNORMAL HIGH (ref 70–99)
Glucose, Bld: 147 mg/dL — ABNORMAL HIGH (ref 70–99)
POTASSIUM: 3.6 mmol/L (ref 3.5–5.1)
POTASSIUM: 3.6 mmol/L (ref 3.5–5.1)
Potassium: 3.5 mmol/L (ref 3.5–5.1)
SODIUM: 133 mmol/L — AB (ref 135–145)
Sodium: 132 mmol/L — ABNORMAL LOW (ref 135–145)
Sodium: 135 mmol/L (ref 135–145)

## 2014-04-21 LAB — CBC
HCT: 24.6 % — ABNORMAL LOW (ref 36.0–46.0)
HCT: 27 % — ABNORMAL LOW (ref 36.0–46.0)
Hemoglobin: 8.6 g/dL — ABNORMAL LOW (ref 12.0–15.0)
Hemoglobin: 9.4 g/dL — ABNORMAL LOW (ref 12.0–15.0)
MCH: 28.9 pg (ref 26.0–34.0)
MCH: 29.4 pg (ref 26.0–34.0)
MCHC: 35 g/dL (ref 30.0–36.0)
MCHC: 34.8 g/dL (ref 30.0–36.0)
MCV: 82.6 fL (ref 78.0–100.0)
MCV: 84.4 fL (ref 78.0–100.0)
Platelets: 165 10*3/uL (ref 150–400)
Platelets: 202 10*3/uL (ref 150–400)
RBC: 3.2 MIL/uL — AB (ref 3.87–5.11)
RBC: 2.98 MIL/uL — AB (ref 3.87–5.11)
RDW: 14.4 % (ref 11.5–15.5)
RDW: 14.9 % (ref 11.5–15.5)
WBC: 13 10*3/uL — ABNORMAL HIGH (ref 4.0–10.5)
WBC: 13.2 10*3/uL — AB (ref 4.0–10.5)

## 2014-04-21 LAB — PHOSPHORUS: Phosphorus: 1.5 mg/dL — ABNORMAL LOW (ref 2.3–4.6)

## 2014-04-21 LAB — PREPARE RBC (CROSSMATCH)

## 2014-04-21 LAB — MAGNESIUM: Magnesium: 2.2 mg/dL (ref 1.5–2.5)

## 2014-04-21 SURGERY — INSERTION, INTRAMEDULLARY ROD, FEMUR
Anesthesia: General | Site: Hip | Laterality: Right

## 2014-04-21 MED ORDER — CEFAZOLIN SODIUM-DEXTROSE 2-3 GM-% IV SOLR
2.0000 g | Freq: Four times a day (QID) | INTRAVENOUS | Status: AC
  Administered 2014-04-21 (×2): 2 g via INTRAVENOUS
  Filled 2014-04-21 (×2): qty 50

## 2014-04-21 MED ORDER — MENTHOL 3 MG MT LOZG: 1.0000 | LOZENGE | OROMUCOSAL | Status: DC | PRN

## 2014-04-21 MED ORDER — ONDANSETRON HCL 4 MG PO TABS: 4.0000 mg | ORAL_TABLET | Freq: Four times a day (QID) | ORAL | Status: DC | PRN

## 2014-04-21 MED ORDER — FENTANYL CITRATE 0.05 MG/ML IJ SOLN
INTRAMUSCULAR | Status: AC
  Filled 2014-04-21: qty 2

## 2014-04-21 MED ORDER — CEFAZOLIN SODIUM-DEXTROSE 2-3 GM-% IV SOLR
INTRAVENOUS | Status: AC
  Filled 2014-04-21: qty 50

## 2014-04-21 MED ORDER — DEXAMETHASONE SODIUM PHOSPHATE 10 MG/ML IJ SOLN: INTRAMUSCULAR | Status: DC | PRN

## 2014-04-21 MED ORDER — ISOPROPYL ALCOHOL 70 % SOLN
Status: AC
  Filled 2014-04-21: qty 480

## 2014-04-21 MED ORDER — PHENYLEPHRINE HCL 10 MG/ML IJ SOLN
INTRAMUSCULAR | Status: DC | PRN
  Administered 2014-04-21: 40 ug via INTRAVENOUS
  Administered 2014-04-21: 80 ug via INTRAVENOUS
  Administered 2014-04-21: 40 ug via INTRAVENOUS

## 2014-04-21 MED ORDER — GLYCOPYRROLATE 0.2 MG/ML IJ SOLN
INTRAMUSCULAR | Status: AC
  Filled 2014-04-21: qty 2

## 2014-04-21 MED ORDER — DOCUSATE SODIUM 100 MG PO CAPS
100.0000 mg | ORAL_CAPSULE | Freq: Two times a day (BID) | ORAL | Status: DC
  Administered 2014-04-21 – 2014-04-24 (×6): 100 mg via ORAL
  Filled 2014-04-21 (×7): qty 1

## 2014-04-21 MED ORDER — ONDANSETRON HCL 4 MG/2ML IJ SOLN
INTRAMUSCULAR | Status: AC
  Filled 2014-04-21: qty 2

## 2014-04-21 MED ORDER — ALPRAZOLAM 0.25 MG PO TABS
0.2500 mg | ORAL_TABLET | ORAL | Status: DC | PRN
  Filled 2014-04-21: qty 1

## 2014-04-21 MED ORDER — 0.9 % SODIUM CHLORIDE (POUR BTL) OPTIME
TOPICAL | Status: DC | PRN
  Administered 2014-04-21: 1000 mL

## 2014-04-21 MED ORDER — ETOMIDATE 2 MG/ML IV SOLN
INTRAVENOUS | Status: DC | PRN
  Administered 2014-04-21: 10 mg via INTRAVENOUS

## 2014-04-21 MED ORDER — CEFAZOLIN SODIUM-DEXTROSE 2-3 GM-% IV SOLR
2.0000 g | INTRAVENOUS | Status: AC
  Administered 2014-04-21: 2 g via INTRAVENOUS

## 2014-04-21 MED ORDER — ONDANSETRON HCL 4 MG/2ML IJ SOLN
INTRAMUSCULAR | Status: DC | PRN
  Administered 2014-04-21 (×2): 2 mg via INTRAVENOUS

## 2014-04-21 MED ORDER — FENTANYL CITRATE 0.05 MG/ML IJ SOLN
INTRAMUSCULAR | Status: DC | PRN
  Administered 2014-04-21 (×2): 50 ug via INTRAVENOUS

## 2014-04-21 MED ORDER — ETOMIDATE 2 MG/ML IV SOLN
INTRAVENOUS | Status: AC
  Filled 2014-04-21: qty 10

## 2014-04-21 MED ORDER — ACETAMINOPHEN 650 MG RE SUPP: 650.0000 mg | Freq: Four times a day (QID) | RECTAL | Status: DC | PRN

## 2014-04-21 MED ORDER — PROPOFOL 10 MG/ML IV BOLUS
INTRAVENOUS | Status: AC
  Filled 2014-04-21: qty 20

## 2014-04-21 MED ORDER — ENOXAPARIN SODIUM 30 MG/0.3ML ~~LOC~~ SOLN
30.0000 mg | SUBCUTANEOUS | Status: DC
  Administered 2014-04-22 – 2014-04-24 (×3): 30 mg via SUBCUTANEOUS
  Filled 2014-04-21 (×4): qty 0.3

## 2014-04-21 MED ORDER — GLYCOPYRROLATE 0.2 MG/ML IJ SOLN
INTRAMUSCULAR | Status: DC | PRN
  Administered 2014-04-21: 0.2 mg via INTRAVENOUS

## 2014-04-21 MED ORDER — SODIUM CHLORIDE 0.9 % IV SOLN: Freq: Once | INTRAVENOUS | Status: DC

## 2014-04-21 MED ORDER — EPHEDRINE SULFATE 50 MG/ML IJ SOLN
INTRAMUSCULAR | Status: DC | PRN
  Administered 2014-04-21: 15 mg via INTRAVENOUS

## 2014-04-21 MED ORDER — FENTANYL CITRATE 0.05 MG/ML IJ SOLN
INTRAMUSCULAR | Status: AC
Start: 2014-04-21 — End: 2014-04-21
  Filled 2014-04-21: qty 2

## 2014-04-21 MED ORDER — FENTANYL CITRATE 0.05 MG/ML IJ SOLN
25.0000 ug | INTRAMUSCULAR | Status: DC | PRN
  Administered 2014-04-21: 25 ug via INTRAVENOUS
  Administered 2014-04-22: 50 ug via INTRAVENOUS
  Filled 2014-04-21 (×2): qty 2

## 2014-04-21 MED ORDER — CISATRACURIUM BESYLATE 20 MG/10ML IV SOLN
INTRAVENOUS | Status: AC
  Filled 2014-04-21: qty 10

## 2014-04-21 MED ORDER — PROPOFOL 10 MG/ML IV BOLUS
INTRAVENOUS | Status: DC | PRN
  Administered 2014-04-21: 100 mg via INTRAVENOUS

## 2014-04-21 MED ORDER — ACETAMINOPHEN 325 MG PO TABS
650.0000 mg | ORAL_TABLET | Freq: Four times a day (QID) | ORAL | Status: DC | PRN
  Administered 2014-04-23: 650 mg via ORAL
  Filled 2014-04-21: qty 2

## 2014-04-21 MED ORDER — SODIUM CHLORIDE 0.9 % IJ SOLN
INTRAMUSCULAR | Status: AC
  Filled 2014-04-21: qty 10

## 2014-04-21 MED ORDER — PHENOL 1.4 % MT LIQD: 1.0000 | OROMUCOSAL | Status: DC | PRN

## 2014-04-21 MED ORDER — METOCLOPRAMIDE HCL 5 MG/ML IJ SOLN: 5.0000 mg | Freq: Three times a day (TID) | INTRAMUSCULAR | Status: DC | PRN

## 2014-04-21 MED ORDER — SODIUM CHLORIDE 0.9 % IV SOLN
INTRAVENOUS | Status: DC | PRN
  Administered 2014-04-21 (×2): via INTRAVENOUS

## 2014-04-21 MED ORDER — METOCLOPRAMIDE HCL 10 MG PO TABS: 5.0000 mg | ORAL_TABLET | Freq: Three times a day (TID) | ORAL | Status: DC | PRN

## 2014-04-21 MED ORDER — DEXAMETHASONE SODIUM PHOSPHATE 10 MG/ML IJ SOLN
INTRAMUSCULAR | Status: DC | PRN
  Administered 2014-04-21: 10 mg via INTRAVENOUS

## 2014-04-21 MED ORDER — SENNA 8.6 MG PO TABS
1.0000 | ORAL_TABLET | Freq: Two times a day (BID) | ORAL | Status: DC
  Administered 2014-04-21 – 2014-04-24 (×5): 8.6 mg via ORAL
  Filled 2014-04-21 (×6): qty 1

## 2014-04-21 MED ORDER — ACETAMINOPHEN 500 MG PO TABS
1000.0000 mg | ORAL_TABLET | Freq: Four times a day (QID) | ORAL | Status: AC
  Administered 2014-04-21 – 2014-04-22 (×4): 1000 mg via ORAL
  Filled 2014-04-21 (×4): qty 2

## 2014-04-21 MED ORDER — METOCLOPRAMIDE HCL 5 MG/ML IJ SOLN: 10.0000 mg | Freq: Once | INTRAMUSCULAR | Status: AC | PRN

## 2014-04-21 MED ORDER — CISATRACURIUM BESYLATE (PF) 10 MG/5ML IV SOLN
INTRAVENOUS | Status: DC | PRN
  Administered 2014-04-21: 6 mg via INTRAVENOUS

## 2014-04-21 MED ORDER — ONDANSETRON HCL 4 MG/2ML IJ SOLN: 4.0000 mg | Freq: Four times a day (QID) | INTRAMUSCULAR | Status: DC | PRN

## 2014-04-21 MED ORDER — LACTATED RINGERS IV SOLN
INTRAVENOUS | Status: DC
  Administered 2014-04-21: 1000 mL via INTRAVENOUS

## 2014-04-21 MED ORDER — EPHEDRINE SULFATE 50 MG/ML IJ SOLN
INTRAMUSCULAR | Status: AC
  Filled 2014-04-21: qty 1

## 2014-04-21 MED ORDER — GLYCOPYRROLATE 0.2 MG/ML IJ SOLN
INTRAMUSCULAR | Status: AC
  Filled 2014-04-21: qty 1

## 2014-04-21 SURGICAL SUPPLY — 56 items
BAG ZIPLOCK 12X15 (MISCELLANEOUS) ×3 IMPLANT
BIT DRILL AO GAMMA 4.2X180 (BIT) ×3 IMPLANT
BLADE SURG 15 STRL LF DISP TIS (BLADE) ×1 IMPLANT
BLADE SURG 15 STRL SS (BLADE) ×3
DRAPE C-ARM 42X120 X-RAY (DRAPES) ×3 IMPLANT
DRAPE C-ARMOR (DRAPES) ×3 IMPLANT
DRAPE INCISE IOBAN 66X45 STRL (DRAPES) ×3 IMPLANT
DRAPE ORTHO SPLIT 77X108 STRL (DRAPES) ×4
DRAPE STERI IOBAN 125X83 (DRAPES) ×3 IMPLANT
DRAPE SURG ORHT 6 SPLT 77X108 (DRAPES) ×2 IMPLANT
DRAPE TABLE BACK 44X90 PK DISP (DRAPES) ×3 IMPLANT
DRAPE U-SHAPE 47X51 STRL (DRAPES) ×6 IMPLANT
DRSG MEPILEX BORDER 4X8 (GAUZE/BANDAGES/DRESSINGS) ×3 IMPLANT
DRSG PAD ABDOMINAL 8X10 ST (GAUZE/BANDAGES/DRESSINGS) ×3 IMPLANT
DURAPREP 26ML APPLICATOR (WOUND CARE) ×6 IMPLANT
ELECT REM PT RETURN 9FT ADLT (ELECTROSURGICAL) ×3
ELECTRODE REM PT RTRN 9FT ADLT (ELECTROSURGICAL) ×1 IMPLANT
GAUZE SPONGE 4X4 12PLY STRL (GAUZE/BANDAGES/DRESSINGS) ×6 IMPLANT
GLOVE BIOGEL PI IND STRL 7.5 (GLOVE) ×2 IMPLANT
GLOVE BIOGEL PI IND STRL 8.5 (GLOVE) ×2 IMPLANT
GLOVE BIOGEL PI INDICATOR 7.5 (GLOVE) ×4
GLOVE BIOGEL PI INDICATOR 8.5 (GLOVE) ×4
GLOVE ECLIPSE 8.0 STRL XLNG CF (GLOVE) IMPLANT
GLOVE ORTHO TXT STRL SZ7.5 (GLOVE) ×6 IMPLANT
GLOVE SURG ORTHO 8.0 STRL STRW (GLOVE) ×6 IMPLANT
GOWN SPEC L3 XXLG W/TWL (GOWN DISPOSABLE) ×6 IMPLANT
GOWN STRL REUS W/TWL LRG LVL3 (GOWN DISPOSABLE) ×3 IMPLANT
GUIDEROD T2 3X1000 (ROD) ×3 IMPLANT
K-WIRE  3.2X450M STR (WIRE) ×2
K-WIRE 3.2X450M STR (WIRE) ×1
KIT BASIN OR (CUSTOM PROCEDURE TRAY) ×3 IMPLANT
KWIRE 3.2X450M STR (WIRE) ×1 IMPLANT
LIQUID BAND (GAUZE/BANDAGES/DRESSINGS) ×3 IMPLANT
MANIFOLD NEPTUNE II (INSTRUMENTS) ×3 IMPLANT
NAIL GAMMA LONG (Nail) ×3 IMPLANT
NS IRRIG 1000ML POUR BTL (IV SOLUTION) ×3 IMPLANT
PACK GENERAL/GYN (CUSTOM PROCEDURE TRAY) ×3 IMPLANT
PENCIL BUTTON HOLSTER BLD 10FT (ELECTRODE) ×3 IMPLANT
POSITIONER SURGICAL ARM (MISCELLANEOUS) ×3 IMPLANT
REAMER SHAFT BIXCUT (INSTRUMENTS) ×3 IMPLANT
SCREW LAG GAMMA 3 TI 10.5X85MM (Screw) ×3 IMPLANT
SCREW LOCKING T2 F/T  5X37.5MM (Screw) ×2 IMPLANT
SCREW LOCKING T2 F/T 5X37.5MM (Screw) ×1 IMPLANT
SET PAD KNEE POSITIONER (MISCELLANEOUS) ×3 IMPLANT
STAPLER VISISTAT 35W (STAPLE) ×3 IMPLANT
SUT MNCRL AB 3-0 PS2 18 (SUTURE) ×3 IMPLANT
SUT MON AB 2-0 CT1 36 (SUTURE) ×3 IMPLANT
SUT VIC AB 0 CT1 27 (SUTURE) ×3
SUT VIC AB 0 CT1 27XBRD ANTBC (SUTURE) ×1 IMPLANT
SUT VIC AB 1 CT1 36 (SUTURE) ×3 IMPLANT
SUT VIC AB 2-0 CT1 27 (SUTURE) ×4
SUT VIC AB 2-0 CT1 TAPERPNT 27 (SUTURE) ×2 IMPLANT
TOWEL OR 17X26 10 PK STRL BLUE (TOWEL DISPOSABLE) ×6 IMPLANT
TRAY FOLEY CATH 14FRSI W/METER (CATHETERS) IMPLANT
TUBE CLIP GAMMA CLOSED ×3 IMPLANT
WATER STERILE IRR 1500ML POUR (IV SOLUTION) ×3 IMPLANT

## 2014-04-21 NOTE — H&P (View-Only) (Signed)
Reason for Consult: Injured right hip possible fall   Referring Physician: EDP  Kimberly Avery is an 81 y.o. female.  HPI: Possible fall. Pain in hip.  Past Medical History  Diagnosis Date  . Hypertension   . Hyperlipidemia   . Gout   . Osteoporosis   . CKD (chronic kidney disease) stage 4, GFR 15-29 ml/min   . Glaucoma   . Vitamin D deficiency     Past Surgical History  Procedure Laterality Date  . Breast surgery Bilateral 1970    mastectomies - fiboradenoma  . Eye surgery  2003, 2004  . Temporal artery biopsy / ligation  2001    Family History  Problem Relation Age of Onset  . Heart attack Mother     MI while in hospital, RA, HTN  . Stroke Father     MI, CAD, alcoholism  . Cancer Brother     prostate cancer  . Heart Problems Sister     pacemaker, DM, RA, HTN    Social History:  reports that she has never smoked. She has never used smokeless tobacco. She reports that she does not drink alcohol or use illicit drugs.  Allergies: No Known Allergies  Medications: I have reviewed the patient's current medications.  Results for orders placed or performed during the hospital encounter of 04/17/14 (from the past 48 hour(s))  CBC with Differential/Platelet     Status: Abnormal   Collection Time: 04/17/14  1:46 AM  Result Value Ref Range   WBC 8.5 4.0 - 10.5 K/uL   RBC 4.00 3.87 - 5.11 MIL/uL   Hemoglobin 11.2 (L) 12.0 - 15.0 g/dL   HCT 31.3 (L) 36.0 - 46.0 %   MCV 78.3 78.0 - 100.0 fL   MCH 28.0 26.0 - 34.0 pg   MCHC 35.8 30.0 - 36.0 g/dL   RDW 12.3 11.5 - 15.5 %   Platelets 143 (L) 150 - 400 K/uL   Neutrophils Relative % 84 (H) 43 - 77 %   Neutro Abs 7.2 1.7 - 7.7 K/uL   Lymphocytes Relative 11 (L) 12 - 46 %   Lymphs Abs 0.9 0.7 - 4.0 K/uL   Monocytes Relative 5 3 - 12 %   Monocytes Absolute 0.5 0.1 - 1.0 K/uL   Eosinophils Relative 0 0 - 5 %   Eosinophils Absolute 0.0 0.0 - 0.7 K/uL   Basophils Relative 0 0 - 1 %   Basophils Absolute 0.0 0.0 - 0.1 K/uL   Comprehensive metabolic panel     Status: Abnormal   Collection Time: 04/17/14  1:46 AM  Result Value Ref Range   Sodium 107 (LL) 135 - 145 mmol/L    Comment: REPEATED TO VERIFY CRITICAL RESULT CALLED TO, READ BACK BY AND VERIFIED WITH: E.TOSTEN,RN AT 0231 ON 04/17/14 BY W.SHEA,MT    Potassium 2.8 (L) 3.5 - 5.1 mmol/L   Chloride 70 (L) 96 - 112 mmol/L   CO2 18 (L) 19 - 32 mmol/L   Glucose, Bld 233 (H) 70 - 99 mg/dL   BUN 30 (H) 6 - 23 mg/dL   Creatinine, Ser 1.64 (H) 0.50 - 1.10 mg/dL   Calcium 7.9 (L) 8.4 - 10.5 mg/dL   Total Protein 6.6 6.0 - 8.3 g/dL   Albumin 3.7 3.5 - 5.2 g/dL   AST 125 (H) 0 - 37 U/L   ALT 38 (H) 0 - 35 U/L   Alkaline Phosphatase 63 39 - 117 U/L   Total Bilirubin 1.0 0.3 - 1.2   mg/dL   GFR calc non Af Amer 27 (L) >90 mL/min   GFR calc Af Amer 31 (L) >90 mL/min    Comment: (NOTE) The eGFR has been calculated using the CKD EPI equation. This calculation has not been validated in all clinical situations. eGFR's persistently <90 mL/min signify possible Chronic Kidney Disease.    Anion gap 19 (H) 5 - 15  Lipase, blood     Status: None   Collection Time: 04/17/14  1:46 AM  Result Value Ref Range   Lipase 42 11 - 59 U/L  Protime-INR     Status: None   Collection Time: 04/17/14  1:46 AM  Result Value Ref Range   Prothrombin Time 14.2 11.6 - 15.2 seconds   INR 1.08 0.00 - 1.49  Magnesium     Status: None   Collection Time: 04/17/14  1:46 AM  Result Value Ref Range   Magnesium 1.9 1.5 - 2.5 mg/dL  I-stat chem 8, ed     Status: Abnormal   Collection Time: 04/17/14  1:52 AM  Result Value Ref Range   Sodium 104 (LL) 135 - 145 mmol/L   Potassium 2.9 (L) 3.5 - 5.1 mmol/L   Chloride 72 (L) 96 - 112 mmol/L   BUN 27 (H) 6 - 23 mg/dL   Creatinine, Ser 1.60 (H) 0.50 - 1.10 mg/dL   Glucose, Bld 233 (H) 70 - 99 mg/dL   Calcium, Ion 0.86 (L) 1.13 - 1.30 mmol/L   TCO2 13 0 - 100 mmol/L   Hemoglobin 13.3 12.0 - 15.0 g/dL   HCT 39.0 36.0 - 46.0 %  I-stat troponin,  ED     Status: Abnormal   Collection Time: 04/17/14  1:52 AM  Result Value Ref Range   Troponin i, poc 1.25 (HH) 0.00 - 0.08 ng/mL   Comment 3            Comment: Due to the release kinetics of cTnI, a negative result within the first hours of the onset of symptoms does not rule out myocardial infarction with certainty. If myocardial infarction is still suspected, repeat the test at appropriate intervals.   CBG monitoring, ED     Status: Abnormal   Collection Time: 04/17/14  1:52 AM  Result Value Ref Range   Glucose-Capillary 214 (H) 70 - 99 mg/dL   Comment 1 Notify RN    Comment 2 Document in Chart   Urinalysis, Routine w reflex microscopic     Status: Abnormal   Collection Time: 04/17/14  1:54 AM  Result Value Ref Range   Color, Urine YELLOW YELLOW   APPearance CLEAR CLEAR   Specific Gravity, Urine 1.008 1.005 - 1.030   pH 7.0 5.0 - 8.0   Glucose, UA NEGATIVE NEGATIVE mg/dL   Hgb urine dipstick LARGE (A) NEGATIVE   Bilirubin Urine NEGATIVE NEGATIVE   Ketones, ur NEGATIVE NEGATIVE mg/dL   Protein, ur 100 (A) NEGATIVE mg/dL   Urobilinogen, UA 0.2 0.0 - 1.0 mg/dL   Nitrite NEGATIVE NEGATIVE   Leukocytes, UA NEGATIVE NEGATIVE  Urine rapid drug screen (hosp performed)     Status: None   Collection Time: 04/17/14  1:54 AM  Result Value Ref Range   Opiates NONE DETECTED NONE DETECTED   Cocaine NONE DETECTED NONE DETECTED   Benzodiazepines NONE DETECTED NONE DETECTED   Amphetamines NONE DETECTED NONE DETECTED   Tetrahydrocannabinol NONE DETECTED NONE DETECTED   Barbiturates NONE DETECTED NONE DETECTED    Comment:          DRUG SCREEN FOR MEDICAL PURPOSES ONLY.  IF CONFIRMATION IS NEEDED FOR ANY PURPOSE, NOTIFY LAB WITHIN 5 DAYS.        LOWEST DETECTABLE LIMITS FOR URINE DRUG SCREEN Drug Class       Cutoff (ng/mL) Amphetamine      1000 Barbiturate      200 Benzodiazepine   200 Tricyclics       300 Opiates          300 Cocaine          300 THC              50   Urine  microscopic-add on     Status: Abnormal   Collection Time: 04/17/14  1:54 AM  Result Value Ref Range   Squamous Epithelial / LPF RARE RARE   WBC, UA 0-2 <3 WBC/hpf   RBC / HPF 7-10 <3 RBC/hpf   Bacteria, UA RARE RARE   Casts GRANULAR CAST (A) NEGATIVE  I-Stat CG4 Lactic Acid, ED     Status: Abnormal   Collection Time: 04/17/14  1:55 AM  Result Value Ref Range   Lactic Acid, Venous 7.08 (HH) 0.5 - 2.0 mmol/L  Ethanol     Status: None   Collection Time: 04/17/14  2:37 AM  Result Value Ref Range   Alcohol, Ethyl (B) <5 0 - 9 mg/dL    Comment:        LOWEST DETECTABLE LIMIT FOR SERUM ALCOHOL IS 11 mg/dL FOR MEDICAL PURPOSES ONLY   I-Stat CG4 Lactic Acid, ED     Status: Abnormal   Collection Time: 04/17/14  4:14 AM  Result Value Ref Range   Lactic Acid, Venous 5.70 (HH) 0.5 - 2.0 mmol/L  I-stat chem 8, ed     Status: Abnormal   Collection Time: 04/17/14  4:17 AM  Result Value Ref Range   Sodium 106 (LL) 135 - 145 mmol/L   Potassium 3.1 (L) 3.5 - 5.1 mmol/L   Chloride 73 (L) 96 - 112 mmol/L   BUN 29 (H) 6 - 23 mg/dL   Creatinine, Ser 1.70 (H) 0.50 - 1.10 mg/dL   Glucose, Bld 207 (H) 70 - 99 mg/dL   Calcium, Ion 1.00 (L) 1.13 - 1.30 mmol/L   TCO2 15 0 - 100 mmol/L   Hemoglobin 7.8 (L) 12.0 - 15.0 g/dL   HCT 23.0 (L) 36.0 - 46.0 %  Blood gas, arterial     Status: Abnormal   Collection Time: 04/17/14  4:35 AM  Result Value Ref Range   O2 Content 2.5 L/min   Delivery systems NASAL CANNULA    pH, Arterial 7.425 7.350 - 7.450   pCO2 arterial 24.5 (L) 35.0 - 45.0 mmHg   pO2, Arterial 141.0 (H) 80.0 - 100.0 mmHg   Bicarbonate 15.9 (L) 20.0 - 24.0 mEq/L   TCO2 15.0 0 - 100 mmol/L   Acid-base deficit 7.3 (H) 0.0 - 2.0 mmol/L   O2 Saturation 98.4 %   Patient temperature 97.4    Collection site RIGHT RADIAL    Drawn by [id number]    Sample type ARTERIAL    Allens test (pass/fail) PASS PASS  Basic metabolic panel     Status: Abnormal   Collection Time: 04/17/14  5:55 AM  Result  Value Ref Range   Sodium 110 (LL) 135 - 145 mmol/L    Comment: REPEATED TO VERIFY CRITICAL RESULT CALLED TO, READ BACK BY AND VERIFIED WITH: E BLAKEY AT 0705 ON 03.26.2016 BY NBROOKS      Potassium 3.4 (L) 3.5 - 5.1 mmol/L   Chloride 80 (L) 96 - 112 mmol/L    Comment: RESULT REPEATED AND VERIFIED DELTA CHECK NOTED    CO2 16 (L) 19 - 32 mmol/L   Glucose, Bld 130 (H) 70 - 99 mg/dL   BUN 29 (H) 6 - 23 mg/dL   Creatinine, Ser 1.54 (H) 0.50 - 1.10 mg/dL   Calcium 6.6 (L) 8.4 - 10.5 mg/dL   GFR calc non Af Amer 29 (L) >90 mL/min   GFR calc Af Amer 33 (L) >90 mL/min    Comment: (NOTE) The eGFR has been calculated using the CKD EPI equation. This calculation has not been validated in all clinical situations. eGFR's persistently <90 mL/min signify possible Chronic Kidney Disease.    Anion gap 14 5 - 15  Magnesium     Status: None   Collection Time: 04/17/14  5:55 AM  Result Value Ref Range   Magnesium 2.1 1.5 - 2.5 mg/dL  Phosphorus     Status: None   Collection Time: 04/17/14  5:55 AM  Result Value Ref Range   Phosphorus 3.3 2.3 - 4.6 mg/dL  CBC     Status: Abnormal   Collection Time: 04/17/14  5:55 AM  Result Value Ref Range   WBC 13.3 (H) 4.0 - 10.5 K/uL   RBC 2.91 (L) 3.87 - 5.11 MIL/uL   Hemoglobin 8.3 (L) 12.0 - 15.0 g/dL   HCT 22.5 (L) 36.0 - 46.0 %   MCV 77.3 (L) 78.0 - 100.0 fL   MCH 28.5 26.0 - 34.0 pg   MCHC 36.9 (H) 30.0 - 36.0 g/dL   RDW 12.4 11.5 - 15.5 %   Platelets 110 (L) 150 - 400 K/uL    Comment: SPECIMEN CHECKED FOR CLOTS PLATELET COUNT CONFIRMED BY SMEAR   Lactic acid, plasma     Status: Abnormal   Collection Time: 04/17/14  6:28 AM  Result Value Ref Range   Lactic Acid, Venous 3.7 (HH) 0.5 - 2.0 mmol/L    Comment: REPEATED TO VERIFY CRITICAL RESULT CALLED TO, READ BACK BY AND VERIFIED WITH: P DOWD AT 0729 ON 03.26.2016 BY NBROOKS     Ct Abdomen Pelvis Wo Contrast  04/17/2014   CLINICAL DATA:  Abdominal pain, possible fall.  EXAM: CT ABDOMEN AND  PELVIS WITHOUT CONTRAST  TECHNIQUE: Multidetector CT imaging of the abdomen and pelvis was performed following the standard protocol without IV contrast.  COMPARISON:  CT 03/23/2014  FINDINGS: Scarring again seen in both lower lobes. The heart is at the upper limits of normal in size.  Small hepatic hypodensities, unchanged from prior exam, likely cysts. The gallbladder is physiologically distended. The spleen is normal in size. Pancreas is suboptimally assessed, mildly atrophic but without peripancreatic inflammatory change. Right adrenal gland is normal. Thickening of the left adrenal gland is unchanged.  Mild thinning of both renal parenchyma. There is perinephric stranding about the upper left kidney, new from prior. No hydronephrosis or stone.  Stomach is physiologically distended. There are no dilated or thickened bowel loops. Moderate stool throughout the colon.  Dense atherosclerosis of the abdominal aorta.  No aneurysm.  In the pelvis the bladder is distended. The uterus contains multiple fibroids. No gross adnexal mass, detailed pelvic evaluation is limited.  Comminuted fracture of the right proximal femur primarily intertrochanteric with a subtrochanteric component. Fracture line approaches the femoral neck. Associated soft tissue edema adjacent to the fracture site. Small amount of hemorrhage tracks along the distal right iliopsoas   muscle. There is degenerative change of the right hip with probable focus of avascular necrosis with femoral head. Mild compression deformity of T12 appears chronic. There is degenerative change throughout the lumbar spine.  IMPRESSION: 1. Findings suspicious for pyelonephritis of the upper left kidney. 2. Comminuted mildly displaced right proximal femur fracture, primarily intertrochanteric with the subtrochanteric component. Associated hemorrhage, some of which tracks in the distal iliopsoas muscle.   Electronically Signed   By: Jeb Levering M.D.   On: 04/17/2014 03:45    Dg Chest 2 View  04/17/2014   CLINICAL DATA:  Altered mental status, abdominal pain.  EXAM: CHEST  2 VIEW  COMPARISON:  07/13/2011  FINDINGS: There is hyperinflation of the lungs compatible with COPD. Heart and mediastinal contours are within normal limits. No focal opacities or effusions. No acute bony abnormality.  IMPRESSION: COPD.  No active disease.   Electronically Signed   By: Rolm Baptise M.D.   On: 04/17/2014 02:15   Ct Head Wo Contrast  04/17/2014   CLINICAL DATA:  Altered mental status.  Possible fall.  EXAM: CT HEAD WITHOUT CONTRAST  TECHNIQUE: Contiguous axial images were obtained from the base of the skull through the vertex without intravenous contrast.  COMPARISON:  None.  FINDINGS: Age-related atrophy and chronic small vessel ischemia. No intracranial hemorrhage, mass effect, or midline shift. No hydrocephalus. The basilar cisterns are patent. No evidence of territorial infarct. No intracranial fluid collection. Calvarium is intact. Minimal mucosal thickening of left ethmoid air cell. Minimal opacification of lower left mastoid air cells.  IMPRESSION: Age-related atrophy and chronic small vessel ischemic change. No acute intracranial abnormality.   Electronically Signed   By: Jeb Levering M.D.   On: 04/17/2014 03:28   Dg Hip Unilat With Pelvis 2-3 Views Right  04/17/2014   CLINICAL DATA:  Right hip pain, no longer ambulatory.  EXAM: RIGHT HIP (WITH PELVIS) 2-3 VIEWS  COMPARISON:  None.  FINDINGS: There is a comminuted displaced fracture of the right proximal femur. This is primarily intertrochanteric and sub trochanteric with involvement of the lesser trochanter. Mild apex lateral angulation and foreshortening. No additional acute fracture. There is degenerative change of the right hip. Calcified uterine fibroids are noted.  IMPRESSION: Comminuted proximal femur fracture involving the intertrochanteric and subtrochanteric femur.   Electronically Signed   By: Jeb Levering M.D.    On: 04/17/2014 03:23    Review of Systems  Musculoskeletal: Positive for joint pain.  Neurological: Positive for weakness.  All other systems reviewed and are negative.  Blood pressure 94/35, pulse 62, temperature 98.5 F (36.9 C), temperature source Rectal, resp. rate 16, SpO2 100 %. Physical Exam  Constitutional: She is oriented to person, place, and time. She appears distressed.  HENT:  Head: Normocephalic.  Eyes: Pupils are equal, round, and reactive to light.  Neck: Normal range of motion.  Respiratory: Effort normal.  GI: Soft.  Musculoskeletal: She exhibits tenderness.  Pain with attempted ROM right hip. Neurovascularly intact. No DVT  Neurological: She is alert and oriented to person, place, and time.  Skin: Skin is warm and dry.  Psychiatric: She has a normal mood and affect.    Assessment/Plan:  Right hip intertrochanteric subtroch fracture. Critical hyponatremia. Hypokalemic. Plan IM nailing after medically cleared. Bedrest. Pillow splinting prn. Dr. Alvan Dame to see tomorrow.  Johnn Hai  [phone]. 04/17/2014, 8:03 AM   Spoke with daughter Vaughan Basta discussing surgery when medically stabilized.

## 2014-04-21 NOTE — Discharge Instructions (Addendum)
Dr. Samson FredericBrian Swinteck Total Joint Specialist [facility] [address] [place], [place] [address] [phone]                                                                                                                                    POSTOPERATIVE DIRECTIONS    Hip Rehabilitation, Guidelines Following Surgery  The results of a hip operation are greatly improved after range of motion and muscle strengthening exercises. Follow all safety measures which are given to protect your hip. If any of these exercises cause increased pain or swelling in your joint, decrease the amount until you are comfortable again. Then slowly increase the exercises. Call your caregiver if you have problems or questions.  HOME CARE INSTRUCTIONS  Most of the following instructions are designed to prevent the dislocation of your new hip.  Remove items at home which could result in a fall. This includes throw rugs or furniture in walking pathways.  Continue medications as instructed at time of discharge.  You may have some home medications which will be placed on hold until you complete the course of blood thinner medication.  You may start showering once you are discharged home but do not submerge the incision under water. Just pat the incision dry and apply a dry gauze dressing on daily. Do not put on socks or shoes without following the instructions of your caregivers.  Sit on chairs with arms. Use the chair arms to help push yourself up when arising.  Keep your leg on the side of the operation out in front of you when standing up.  Arrange for the use of a toilet seat elevator so you are not sitting low.  Do not do any exercises or get in any positions that cause your toes to point in (pigeon toed).  Always sleep with a pillow between your legs. Do not lie on your side in sleep with both knees touching the bed.   Walk with walker as instructed.  You may resume a sexual relationship  in one month or when given the OK by your caregiver.  Use walker as long as suggested by your caregivers.  Touch Down Weight Bearing Right Leg Avoid periods of inactivity such as sitting longer than an hour when not asleep. This helps prevent blood clots.  You may return to work once you are cleared by Designer, industrial/productyour surgeon.  Do not drive a car for 6 weeks or until released by your surgeon.  Do not drive while taking narcotics.  Wear elastic stockings for three weeks following surgery during the day but you may remove then at night.  Make sure you keep all of your appointments after your operation with all of your doctors and caregivers. You should call the office at the above phone number and make an appointment for approximately two weeks after the date of your surgery. Change the dressing daily and reapply a dry  dressing each time. Please pick up a stool softener and laxative for home use as long as you are requiring pain medications.  ICE to the affected hip every three hours for 30 minutes at a time and then as needed for pain and swelling. Continue to use ice on the hip for pain and swelling from surgery. You may notice swelling that will progress down to the foot and ankle.  This is normal after surgery.  Elevate the leg when you are not up walking on it.   It is important for you to complete the blood thinner medication as prescribed by your doctor.  Continue to use the breathing machine which will help keep your temperature down.  It is common for your temperature to cycle up and down following surgery, especially at night when you are not up moving around and exerting yourself.  The breathing machine keeps your lungs expanded and your temperature down.  RANGE OF MOTION AND STRENGTHENING EXERCISES  These exercises are designed to help you keep full movement of your hip joint. Follow your caregiver's or physical therapist's instructions. Perform all exercises about fifteen times, three times per day  or as directed. Exercise both hips, even if you have had only one joint replacement. These exercises can be done on a training (exercise) mat, on the floor, on a table or on a bed. Use whatever works the best and is most comfortable for you. Use music or television while you are exercising so that the exercises are a pleasant break in your day. This will make your life better with the exercises acting as a break in routine you can look forward to.  Lying on your back, slowly slide your foot toward your buttocks, raising your knee up off the floor. Then slowly slide your foot back down until your leg is straight again.  Lying on your back spread your legs as far apart as you can without causing discomfort.  Lying on your side, raise your upper leg and foot straight up from the floor as far as is comfortable. Slowly lower the leg and repeat.  Lying on your back, tighten up the muscle in the front of your thigh (quadriceps muscles). You can do this by keeping your leg straight and trying to raise your heel off the floor. This helps strengthen the largest muscle supporting your knee.  Lying on your back, tighten up the muscles of your buttocks both with the legs straight and with the knee bent at a comfortable angle while keeping your heel on the floor.   SKILLED REHAB INSTRUCTIONS: If the patient is transferred to a skilled rehab facility following release from the hospital, a list of the current medications will be sent to the facility for the patient to continue.  When discharged from the skilled rehab facility, please have the facility set up the patient's Home Health Physical Therapy prior to being released. Also, the skilled facility will be responsible for providing the patient with their medications at time of release from the facility to include their pain medication, the muscle relaxants, and their blood thinner medication. If the patient is still at the rehab facility at time of the two week follow up  appointment, the skilled rehab facility will also need to assist the patient in arranging follow up appointment in our office and any transportation needs.  MAKE SURE YOU:  Understand these instructions.  Will watch your condition.  Will get help right away if you are not doing well  or get worse.   Touch Down Weight Bearing Right Leg. Pick up stool softner and laxative for home use following surgery while on pain medications. Do not submerge incision under water. Please use good hand washing techniques while changing dressing each day. May shower starting three days after surgery. Please use a clean towel to pat the incision dry following showers. Continue to use ice for pain and swelling after surgery. Do not use any lotions or creams on the incision until instructed by your surgeon.  Hyponatremia  Hyponatremia is when the salt (sodium) in your blood is low. When salt becomes low, your cells take in extra water and puff up (swell). The puffiness can happen in the whole body. It mostly affects the brain and is very serious.  HOME CARE  Only take medicine as told by your doctor.  Follow any diet instructions you were given. This includes limiting how much fluid you drink.  Keep all doctor visits for tests as told.  Avoid alcohol and drugs. GET HELP RIGHT AWAY IF:  You start to twitch and shake (seize).  You pass out (faint).  You continue to have watery poop (diarrhea) or you throw up (vomit).  You feel sick to your stomach (nauseous).  You are tired (fatigued), have a headache, are confused, or feel weak.  Your problems that first brought you to the doctor come back.  You have trouble following your diet instructions. MAKE SURE YOU:   Understand these instructions.  Will watch your condition.  Will get help right away if you are not doing well or get worse. Document Released: 09/20/2010 Document Revised: 04/02/2011 Document Reviewed: 09/20/2010 [facility] Patient  Information 2015 [place], [place]. This information is not intended to replace advice given to you by your health care provider. Make sure you discuss any questions you have with your health care provider.

## 2014-04-21 NOTE — Anesthesia Preprocedure Evaluation (Addendum)
Anesthesia Evaluation  Patient identified by MRN, date of birth, ID band  Reviewed: Allergy & Precautions, NPO status , Patient's Chart, lab work & pertinent test results  Airway Mallampati: II  TM Distance: >3 FB Neck ROM: Full    Dental  (+) Dental Advisory Given   Pulmonary neg pulmonary ROS,  breath sounds clear to auscultation        Cardiovascular hypertension, Pt. on medications and Pt. on home beta blockers Rhythm:Regular Rate:Normal  EF 55%.   Neuro/Psych delerium    GI/Hepatic negative GI ROS, Neg liver ROS,   Endo/Other  Hyponatremia- 132 this AM  Renal/GU ARFRenal diseasePyelonephritis- last day of antibiotics     Musculoskeletal negative musculoskeletal ROS (+)   Abdominal   Peds  Hematology  (+) anemia , Hgb 8.6   Anesthesia Other Findings   Reproductive/Obstetrics                            Anesthesia Physical Anesthesia Plan  ASA: III  Anesthesia Plan: General   Post-op Pain Management:    Induction: Intravenous  Airway Management Planned: Oral ETT  Additional Equipment:   Intra-op Plan:   Post-operative Plan: Extubation in OR  Informed Consent: I have reviewed the patients History and Physical, chart, labs and discussed the procedure including the risks, benefits and alternatives for the proposed anesthesia with the patient or authorized representative who has indicated his/her understanding and acceptance.   Dental advisory given  Plan Discussed with: CRNA  Anesthesia Plan Comments:         Anesthesia Quick Evaluation

## 2014-04-21 NOTE — Progress Notes (Signed)
Asked to assume care by Dr. Charlann Boxerlin  Na 132 this am  Plan for IM nail R femur later today Hold eliquis NPO

## 2014-04-21 NOTE — Anesthesia Postprocedure Evaluation (Signed)
  Anesthesia Post-op Note  Patient: Mancel Parsonsrnestine E Cullimore  Procedure(s) Performed: Procedure(s): right proximal femur ORIF  (Right)  Patient Location: PACU  Anesthesia Type:General  Level of Consciousness: awake and alert   Airway and Oxygen Therapy: Patient Spontanous Breathing  Post-op Pain: mild  Post-op Assessment: Post-op Vital signs reviewed  Post-op Vital Signs: Reviewed  Last Vitals:  Filed Vitals:   04/21/14 1550  BP: 151/64  Pulse: 82  Temp: 36.4 C  Resp: 18    Complications: No apparent anesthesia complications

## 2014-04-21 NOTE — Anesthesia Procedure Notes (Signed)
Procedure Name: Intubation Date/Time: 04/21/2014 11:48 AM Performed by: Paulla DollyJOYCE, Merlean Pizzini A Pre-anesthesia Checklist: Patient identified, Timeout performed, Emergency Drugs available, Suction available and Patient being monitored Patient Re-evaluated:Patient Re-evaluated prior to inductionOxygen Delivery Method: Circle system utilized Preoxygenation: Pre-oxygenation with 100% oxygen Intubation Type: IV induction Ventilation: Mask ventilation without difficulty Laryngoscope Size: Mac and 4 Grade View: Grade I Tube type: Oral Tube size: 7.0 mm Number of attempts: 1 Airway Equipment and Method: Stylet Placement Confirmation: ETT inserted through vocal cords under direct vision,  positive ETCO2 and breath sounds checked- equal and bilateral Secured at: 19 cm Tube secured with: Tape Dental Injury: Teeth and Oropharynx as per pre-operative assessment

## 2014-04-21 NOTE — Progress Notes (Signed)
PACU note----pt is very confused; pulling O2 off and monitors off; unable to orient; O2 left off, warm blankets on

## 2014-04-21 NOTE — Brief Op Note (Signed)
04/17/2014 - 04/21/2014  1:44 PM  PATIENT:  Mancel ParsonsErnestine E Brendlinger  [age] y.o. female  PRE-OPERATIVE DIAGNOSIS:  right pertrochanteric femur fracture   POST-OPERATIVE DIAGNOSIS:   right pertrochanteric femur fracture  PROCEDURE:  Procedure(s): right proximal femur ORIF  (Right)  SURGEON:  Surgeon(s) and Role:    * Samson FredericBrian Gunnar Hereford, MD - Primary  PHYSICIAN ASSISTANT:   ASSISTANTS: none   ANESTHESIA:   general  EBL:  Total I/O In: 1000 [I.V.:1000] Out: 500 [Urine:450; Blood:50]  BLOOD ADMINISTERED:none  DRAINS: none   LOCAL MEDICATIONS USED:  NONE  SPECIMEN:  No Specimen  DISPOSITION OF SPECIMEN:  N/A  COUNTS:  YES  TOURNIQUET:  * No tourniquets in log *  DICTATION: .Other Dictation: Dictation Number [id number]  PLAN OF CARE: Admit to inpatient   PATIENT DISPOSITION:  PACU - hemodynamically stable.   Delay start of Pharmacological VTE agent (>24hrs) due to surgical blood loss or risk of bleeding: not applicable

## 2014-04-21 NOTE — Progress Notes (Signed)
PROGRESS NOTE  Kimberly Avery GLO:[id number] DOB: December 24, 1923 DOA: 04/17/2014 PCP: Hollice Espy, MD  HPI: 81 yo female brought to ER with vomiting, weakness, abdominal pain, lethargy. She was found to have severe hyponatremia, AKI, Rt femoral fx, and elevated lactic acid.  SIGNIFICANT EVENTS: 3/26 Admit, ortho consulted 3/27 More alert. C/o Rt hip pain. Does not remember being in hospital yesterday. 3/29 [facility] assumed care  Assessment/Plan: Active Problems:   Hyponatremia   Protein-calorie malnutrition, severe   CKD (chronic kidney disease), stage III   Acute blood loss anemia   Thrombocytopenia   Intertrochanteric fracture of right hip   Elevated troponin   Lactic acidosis   Sepsis   Pyelonephritis   Severe hyponatremia  - in the setting of hypovolemia  - from diuretic use, she is on Indapamide - improved with IV fluids, Na 132this morning, continue to monitor with serial BMPs  Right hip fracture Echo shows normal EF - with associated hemorrhage, some tracking in the distal iliopsoas muscle Medically optimized for surgery Surgery today per Dr. Veda Canning  AKI  - CKD III - renal function improving with hydration  Elevated troponin - unclear why obtained in the first place, patient without chest pain or cardiac symptoms Echo shows normal EF with no WMA. No evidence of ACS. D/c tele.  Lactic acidosis - resolved  Hypokalemia corrected  HTN - continue Metoprolol  HLD  - Simvastatin  Anemia, acute blood loss hgb nadir 7.0.  8.6 after 1 unit prbc  Thrombocytopenia - in the setting of acute illness, no prior values available, now back within normal range  Sepsis from pyelonephritis  - cultures negative, but UA, clinical picture and CT scan suggestive pyelonephritis. Clinically much improved. Today will be day 5 rocephin. Will d/c after today and monitor.  Acute encephalopathy Seems back to baseline   Diet: Diet NPO time specified Except for:  Sips with Meds Fluids: NS 100 cc/h DVT Prophylaxis: SCD  Code Status: Full Code Family Communication: d/w patient  Disposition Plan:  Eventual SNF most likely  Consultants:  Orthopedic surgery   PCCM  Procedures:  None    Antibiotics Ceftriaxone 3/26 >>3/30   Studies Echo Left ventricle: The cavity size was normal. There was moderate concentric hypertrophy. Systolic function was normal. The estimated ejection fraction was in the range of 55% to 60%. Wall motion was normal; there were no regional wall motion abnormalities. Doppler parameters are consistent with abnormal left ventricular relaxation (grade 1 diastolic dysfunction). The E/e&' ratio is <8, suggesting normal LV filling pressure. - Aortic valve: Trileaflet. Sclerosis without stenosis. There was mild regurgitation. - Mitral valve: Mildly thickened leaflets . There was mild regurgitation. - Left atrium: The atrium was at the upper limits of normal in size. - Atrial septum: No defect or patent foramen ovale was identified. - Tricuspid valve: There was moderate regurgitation. - Pulmonary arteries: PA peak pressure: 42 mm Hg (S). - Inferior vena cava: The vessel was normal in size. The respirophasic diameter changes were in the normal range (>= 50%), consistent with normal central venous pressure.  Impressions:  - Compared to the prior echo in 09/2013, there have been no significant changes. EF 55-60%, mild AI, MR and moderate TR with RVSP of 42 mmHg.  Subjective / 24 H Interval events She is feeling well this morning, denies chest pain/dyspnea, denies abdominal pain, nausea. Does not recall having fallen  Objective  Filed Vitals:   04/20/14 1800 04/20/14 2000 04/20/14 2300 04/21/14 0509  BP: 156/57  125/57 165/63  Pulse: 80  92 68  Temp:  98.2 F (36.8 C) 97.9 F (36.6 C) 98.6 F (37 C)  TempSrc:  Oral Oral Oral  Resp: 13  14 20   Height:   5\' 6"  (1.676 m)   Weight:    46.2 kg (101 lb 13.6 oz) 46.1 kg (101 lb 10.1 oz)  SpO2: 98%  100% 100%    Intake/Output Summary (Last 24 hours) at 04/21/14 0839 Last data filed at 04/21/14 0520  Gross per 24 hour  Intake 1214.35 ml  Output   1520 ml  Net -305.65 ml   Filed Weights   04/20/14 0448 04/20/14 2300 04/21/14 0509  Weight: 44.9 kg (98 lb 15.8 oz) 46.2 kg (101 lb 13.6 oz) 46.1 kg (101 lb 10.1 oz)    Exam:  General:  NAD, pleasant. cooperative  HEENT: no scleral icterus, PERRL  Cardiovascular: RRR without MRG, 2+ peripheral pulses, no edema  Respiratory: CTA biL, good air movement, no wheezing, no crackles, no rales  Abdomen: soft, non tender, BS +, no guarding  MSK/Extremities: no clubbing/cyanosis, no joint swelling  Skin: no rashes  Neuro: non focal  Data Reviewed: Basic Metabolic Panel:  Recent Labs Lab 04/17/14 0146  04/17/14 0555  04/18/14 0812  04/19/14 2100 04/20/14 0340 04/20/14 1012 04/20/14 1959 04/21/14 0055  NA 107*  < > 110*  < > 117*  < > 125* 127* 131* 131* 132*  K 2.8*  < > 3.4*  < > 3.5  < > 3.0* 3.2* 3.6 4.5 3.6  CL 70*  < > 80*  < > 90*  < > 99 100 106 104 104  CO2 18*  --  16*  < > 18*  < > 17* 20 17* 21 20  GLUCOSE 233*  < > 130*  < > 102*  < > 135* 134* 139* 128* 136*  BUN 30*  < > 29*  < > 30*  < > 34* 31* 26* 23 23  CREATININE 1.64*  < > 1.54*  < > 1.47*  < > 1.25* 1.18* 1.23* 1.23* 1.21*  CALCIUM 7.9*  --  6.6*  < > 6.9*  < > 7.1* 7.5* 7.3* 7.6* 7.7*  MG 1.9  --  2.1  --  QUANTITY NOT SUFFICIENT, UNABLE TO PERFORM TEST  --   --  2.2  --   --  2.2  PHOS  --   --  3.3  --  QUANTITY NOT SUFFICIENT, UNABLE TO PERFORM TEST  --   --  1.6*  --   --  1.5*  < > = values in this interval not displayed. Liver Function Tests:  Recent Labs Lab 04/17/14 0146 04/18/14 0812  AST 125* 167*  ALT 38* 49*  ALKPHOS 63 46  BILITOT 1.0 0.6  PROT 6.6 5.2*  ALBUMIN 3.7 3.0*    Recent Labs Lab 04/17/14 0146  LIPASE 42   CBC:  Recent Labs Lab 04/17/14 0146   04/17/14 0555 04/18/14 0812 04/19/14 0250 04/20/14 1012 04/21/14 0055  WBC 8.5  --  13.3* 12.7* 13.3* 10.1 13.0*  NEUTROABS 7.2  --   --   --   --   --   --   HGB 11.2*  < > 8.3* 7.5* 7.0* 7.1* 8.6*  HCT 31.3*  < > 22.5* 20.8* 19.2* 20.1* 24.6*  MCV 78.3  --  77.3* 77.9* 78.4 79.8 82.6  PLT 143*  --  110* 118* 112* 160 165  < > =  values in this interval not displayed. Cardiac Enzymes:  Recent Labs Lab 04/20/14 0340 04/20/14 1012  TROPONINI 0.22* 0.18*   CBG:  Recent Labs Lab 04/17/14 0152 04/17/14 0749 04/17/14 1229 04/17/14 1705 04/18/14 0811  GLUCAP 214* 130* 105* 120* 108*    Recent Results (from the past 240 hour(s))  Urine culture     Status: None   Collection Time: 04/17/14  1:54 AM  Result Value Ref Range Status   Specimen Description URINE, CATHETERIZED  Final   Special Requests NONE  Final   Colony Count NO GROWTH Performed at [facility]   Final   Culture NO GROWTH Performed at [facility]   Final   Report Status 04/18/2014 FINAL  Final  Culture, blood (routine x 2)     Status: None (Preliminary result)   Collection Time: 04/17/14  2:37 AM  Result Value Ref Range Status   Specimen Description BLOOD R BRACHIAL  Final   Special Requests BOTTLES DRAWN AEROBIC AND ANAEROBIC  Final   Culture   Final           BLOOD CULTURE RECEIVED NO GROWTH TO DATE CULTURE WILL BE HELD FOR 5 DAYS BEFORE ISSUING A FINAL NEGATIVE REPORT Performed at [facility]    Report Status PENDING  Incomplete  MRSA PCR Screening     Status: None   Collection Time: 04/17/14  9:45 AM  Result Value Ref Range Status   MRSA by PCR NEGATIVE NEGATIVE Final    Comment:        The GeneXpert MRSA Assay (FDA approved for NASAL specimens only), is one component of a comprehensive MRSA colonization surveillance program. It is not intended to diagnose MRSA infection nor to guide or monitor treatment for MRSA infections.   Culture, blood (routine x 2)      Status: None (Preliminary result)   Collection Time: 04/17/14 10:45 AM  Result Value Ref Range Status   Specimen Description BLOOD RIGHT ARM  Final   Special Requests BOTTLES DRAWN AEROBIC ONLY 5CC AEROBIC ONLY   Final   Culture   Final           BLOOD CULTURE RECEIVED NO GROWTH TO DATE CULTURE WILL BE HELD FOR 5 DAYS BEFORE ISSUING A FINAL NEGATIVE REPORT Performed at [facility]    Report Status PENDING  Incomplete  Clostridium Difficile by PCR     Status: None   Collection Time: 04/20/14  1:29 AM  Result Value Ref Range Status   C difficile by pcr NEGATIVE NEGATIVE Final     Scheduled Meds: . cefTRIAXone (ROCEPHIN)  IV  1 g Intravenous Q24H  . dorzolamide-timolol  1 drop Both Eyes BID  . feeding supplement (ENSURE ENLIVE)  237 mL Oral BID BM  . metoprolol tartrate  12.5 mg Oral BID  . pantoprazole  40 mg Oral Daily  . simvastatin  20 mg Oral q1800   Continuous Infusions: . sodium chloride 75 mL/hr at 04/21/14 4782   Time spent: 25 minutes  Crista Curb, MD [facility] If 7 PM - 7 AM, please contact night-coverage at [email or web address], password [facility] 04/21/2014, 8:39 AM  LOS: 4 days

## 2014-04-21 NOTE — Interval H&P Note (Signed)
History and Physical Interval Note:  04/21/2014 11:34 AM  Kimberly Avery  has presented today for surgery, with the diagnosis of right distal femur fx  The various methods of treatment have been discussed with the patient and family. After consideration of risks, benefits and other options for treatment, the patient has consented to  Open reduction, intramedullary fixation of right reverse obliquity IT fracture / basicervical femoral neck fracture as a surgical intervention .  The patient's history has been reviewed, patient examined, no change in status, stable for surgery.  I have reviewed the patient's chart and labs.  Questions were answered to the patient's satisfaction.     Serrina Minogue, Cloyde ReamsBrian James

## 2014-04-21 NOTE — Transfer of Care (Signed)
Immediate Anesthesia Transfer of Care Note  Patient: Kimberly Avery  Procedure(s) Performed: Procedure(s): right proximal femur ORIF  (Right)  Patient Location: PACU  Anesthesia Type:General  Level of Consciousness: awake, alert , pateint uncooperative, confused and responds to stimulation  Airway & Oxygen Therapy: Patient Spontanous Breathing and Patient connected to face mask oxygen  Post-op Assessment: Report given to RN, Post -op Vital signs reviewed and stable and Patient moving all extremities  Post vital signs: Reviewed and stable  Last Vitals:  Filed Vitals:   04/21/14 0509  BP: 165/63  Pulse: 68  Temp: 37 C  Resp: 20    Complications: No apparent anesthesia complications

## 2014-04-22 ENCOUNTER — Encounter (HOSPITAL_COMMUNITY): Payer: Self-pay | Admitting: Orthopedic Surgery

## 2014-04-22 LAB — BASIC METABOLIC PANEL
ANION GAP: 7 (ref 5–15)
Anion gap: 8 (ref 5–15)
BUN: 17 mg/dL (ref 6–23)
BUN: 18 mg/dL (ref 6–23)
CHLORIDE: 106 mmol/L (ref 96–112)
CO2: 19 mmol/L (ref 19–32)
CO2: 20 mmol/L (ref 19–32)
CREATININE: 1.24 mg/dL — AB (ref 0.50–1.10)
CREATININE: 1.34 mg/dL — AB (ref 0.50–1.10)
Calcium: 7.3 mg/dL — ABNORMAL LOW (ref 8.4–10.5)
Calcium: 7.6 mg/dL — ABNORMAL LOW (ref 8.4–10.5)
Chloride: 104 mmol/L (ref 96–112)
GFR calc non Af Amer: 37 mL/min — ABNORMAL LOW (ref >90)
GFR calc non Af Amer: 34 mL/min — ABNORMAL LOW (ref >90)
GFR, EST AFRICAN AMERICAN: 43 mL/min — AB (ref >90)
GFR, EST AFRICAN AMERICAN: 39 mL/min — AB (ref >90)
Glucose, Bld: 175 mg/dL — ABNORMAL HIGH (ref 70–99)
Glucose, Bld: 131 mg/dL — ABNORMAL HIGH (ref 70–99)
POTASSIUM: 3.9 mmol/L (ref 3.5–5.1)
POTASSIUM: 3.8 mmol/L (ref 3.5–5.1)
Sodium: 130 mmol/L — ABNORMAL LOW (ref 135–145)
Sodium: 134 mmol/L — ABNORMAL LOW (ref 135–145)

## 2014-04-22 LAB — CBC
HEMATOCRIT: 22.1 % — AB (ref 36.0–46.0)
Hemoglobin: 7.8 g/dL — ABNORMAL LOW (ref 12.0–15.0)
MCH: 29.5 pg (ref 26.0–34.0)
MCHC: 35.3 g/dL (ref 30.0–36.0)
MCV: 83.7 fL (ref 78.0–100.0)
Platelets: 181 10*3/uL (ref 150–400)
RBC: 2.64 MIL/uL — ABNORMAL LOW (ref 3.87–5.11)
RDW: 15.1 % (ref 11.5–15.5)
WBC: 11.3 10*3/uL — ABNORMAL HIGH (ref 4.0–10.5)

## 2014-04-22 LAB — MAGNESIUM: MAGNESIUM: 1.8 mg/dL (ref 1.5–2.5)

## 2014-04-22 LAB — PHOSPHORUS: PHOSPHORUS: 2.4 mg/dL (ref 2.3–4.6)

## 2014-04-22 MED ORDER — SIMETHICONE 80 MG PO CHEW
80.0000 mg | CHEWABLE_TABLET | Freq: Four times a day (QID) | ORAL | Status: DC | PRN
  Administered 2014-04-22 – 2014-04-23 (×3): 80 mg via ORAL
  Filled 2014-04-22 (×4): qty 1

## 2014-04-22 NOTE — Progress Notes (Signed)
[facility] PROGRESS NOTE  Kimberly Avery ZOX:[id number] DOB: [birth year] DOA: 04/17/2014 PCP: Hollice Espy, MD   SIGNIFICANT EVENTS: 3/26 Admit, ortho consulted 3/27 More alert. C/o Rt hip pain. Does not remember being in hospital yesterday. 3/29 [facility] assumed care  Assessment/Plan: 81 yo female with PMH of HTN, HPL, Gout, CKD brought to ER with vomiting, weakness, abdominal pain, lethargy. She was found to have severe hyponatremia, AKI, Rt femoral fx, and elevated lactic acid.  1. Severe hyponatremia Na-106 on admission in the setting of hypovolemia , and diuretic use/Indapamide -Na is improving with IV fluids, today is 130; discontinued indapamide; recheck Na in AM   2. Right hip fracture;  -3/30: s/p Cephalomedullary nail fixation of right pertrochanteric femur fracture; per ortho: TDWB RLE with walker; DVT ppx: lovenox x30 days; pend PT  3. AKI, on top of CKD III; renal function improving with hydration 4. Elevated troponin; unclear why obtained in the first place, patient without chest pain or cardiac symptoms Echo shows normal EF with no WMA. No evidence of ACS. D/c tele. 5. HTN;  continue Metoprolol 6. Acute blood loss Anemia, s/p 1 unit prbc; recheck Hg in AM; no s/s of acute bleeding  7. Sepsis from pyelonephritis; cultures negative, but UA, clinical picture and CT scan suggestive pyelonephritis. Clinically much improved. Completed 5 rocephin. Afebrile;  8. Acute encephalopathy; Seems back to baseline  D/c plans: obtain PT/OT, d/c foley;   Code Status: full Family Communication: d/w patient, her daughter  (indicate person spoken with, relationship, and if by phone, the number) Disposition Plan: pend PT   Consultants:  Orthopedic surgery   PCCM  Procedures:  None   Antibiotics Ceftriaxone 3/26 >>3/30   Studies Echo Left ventricle: The cavity size was normal. There was moderate concentric hypertrophy. Systolic function was normal.  The estimated ejection fraction was in the range of 55% to 60%. Wall motion was normal; there were no regional wall motion abnormalities. Doppler parameters are consistent with abnormal left ventricular relaxation (grade 1 diastolic dysfunction). The E/e&' ratio is <8, suggesting normal LV filling pressure. - Aortic valve: Trileaflet. Sclerosis without stenosis. There was mild regurgitation. - Mitral valve: Mildly thickened leaflets . There was mild regurgitation. - Left atrium: The atrium was at the upper limits of normal in size. - Atrial septum: No defect or patent foramen ovale was identified. - Tricuspid valve: There was moderate regurgitation. - Pulmonary arteries: PA peak pressure: 42 mm Hg (S). - Inferior vena cava: The vessel was normal in size. The respirophasic diameter changes were in the normal range (>= 50%), consistent with normal central venous pressure.  Impressions:  - Compared to the prior echo in 09/2013, there have been no significant changes. EF 55-60%, mild AI, MR and moderate TR with RVSP of 42 mmHg. HPI/Subjective: alert  Objective: Filed Vitals:   04/22/14 0452  BP: 131/55  Pulse: 60  Temp: 98 F (36.7 C)  Resp: 16    Intake/Output Summary (Last 24 hours) at 04/22/14 0851 Last data filed at 04/22/14 0459  Gross per 24 hour  Intake   1800 ml  Output   1800 ml  Net      0 ml   Filed Weights   04/21/14 0509 04/21/14 1415 04/22/14 0500  Weight: 46.1 kg (101 lb 10.1 oz) 48.2 kg (106 lb 4.2 oz) 48.2 kg (106 lb 4.2 oz)    Exam:   General:  Alert, oriented   Cardiovascular: s1,s2 rrr  Respiratory: CTA BL  Abdomen: soft,  nt,nd   Musculoskeletal: no pedal edema   Data Reviewed: Basic Metabolic Panel:  Recent Labs Lab 04/17/14 0555  04/18/14 [id number]  04/20/14 0340  04/20/14 1959 04/21/14 0055 04/21/14 0820 04/21/14 1655 04/22/14 0100  NA 110*  < > 117*  < > 127*  < > 131* 132* 133* 135 130*  K 3.4*  < > 3.5   < > 3.2*  < > 4.5 3.6 3.5 3.6 3.9  CL 80*  < > 90*  < > 100  < > 104 104 103 107 104  CO2 16*  < > 18*  < > 20  < > 21 20 22 19 19   GLUCOSE 130*  < > 102*  < > 134*  < > 128* 136* 113* 147* 175*  BUN 29*  < > 30*  < > 31*  < > 23 23 20 17 17   CREATININE 1.54*  < > 1.47*  < > 1.18*  < > 1.23* 1.21* 1.16* 1.14* 1.24*  CALCIUM 6.6*  < > 6.9*  < > 7.5*  < > 7.6* 7.7* 7.8* 7.6* 7.3*  MG 2.1  --  QUANTITY NOT SUFFICIENT, UNABLE TO PERFORM TEST  --  2.2  --   --  2.2  --   --  1.8  PHOS 3.3  --  QUANTITY NOT SUFFICIENT, UNABLE TO PERFORM TEST  --  1.6*  --   --  1.5*  --   --  2.4  < > = values in this interval not displayed. Liver Function Tests:  Recent Labs Lab 04/17/14 0146 04/18/14 0812  AST 125* 167*  ALT 38* 49*  ALKPHOS 63 46  BILITOT 1.0 0.6  PROT 6.6 5.2*  ALBUMIN 3.7 3.0*    Recent Labs Lab 04/17/14 0146  LIPASE 42   No results for input(s): AMMONIA in the last 168 hours. CBC:  Recent Labs Lab 04/17/14 0146  04/19/14 0250 04/20/14 1012 04/21/14 0055 04/21/14 1655 04/22/14 0100  WBC 8.5  < > 13.3* 10.1 13.0* 13.2* 11.3*  NEUTROABS 7.2  --   --   --   --   --   --   HGB 11.2*  < > 7.0* 7.1* 8.6* 9.4* 7.8*  HCT 31.3*  < > 19.2* 20.1* 24.6* 27.0* 22.1*  MCV 78.3  < > 78.4 79.8 82.6 84.4 83.7  PLT 143*  < > 112* 160 165 202 181  < > = values in this interval not displayed. Cardiac Enzymes:  Recent Labs Lab 04/20/14 0340 04/20/14 1012  TROPONINI 0.22* 0.18*   BNP (last 3 results) No results for input(s): BNP in the last 8760 hours.  ProBNP (last 3 results) No results for input(s): PROBNP in the last 8760 hours.  CBG:  Recent Labs Lab 04/17/14 0152 04/17/14 0749 04/17/14 1229 04/17/14 1705 04/18/14 0811  GLUCAP 214* 130* 105* 120* 108*    Recent Results (from the past 240 hour(s))  Urine culture     Status: None   Collection Time: 04/17/14  1:54 AM  Result Value Ref Range Status   Specimen Description URINE, CATHETERIZED  Final   Special  Requests NONE  Final   Colony Count NO GROWTH Performed at [facility]   Final   Culture NO GROWTH Performed at [facility]   Final   Report Status 04/18/2014 FINAL  Final  Culture, blood (routine x 2)     Status: None (Preliminary result)   Collection Time: 04/17/14  2:37 AM  Result  Value Ref Range Status   Specimen Description BLOOD R BRACHIAL  Final   Special Requests BOTTLES DRAWN AEROBIC AND ANAEROBIC  Final   Culture   Final           BLOOD CULTURE RECEIVED NO GROWTH TO DATE CULTURE WILL BE HELD FOR 5 DAYS BEFORE ISSUING A FINAL NEGATIVE REPORT Performed at [facility]    Report Status PENDING  Incomplete  MRSA PCR Screening     Status: None   Collection Time: 04/17/14  9:45 AM  Result Value Ref Range Status   MRSA by PCR NEGATIVE NEGATIVE Final    Comment:        The GeneXpert MRSA Assay (FDA approved for NASAL specimens only), is one component of a comprehensive MRSA colonization surveillance program. It is not intended to diagnose MRSA infection nor to guide or monitor treatment for MRSA infections.   Culture, blood (routine x 2)     Status: None (Preliminary result)   Collection Time: 04/17/14 10:45 AM  Result Value Ref Range Status   Specimen Description BLOOD RIGHT ARM  Final   Special Requests BOTTLES DRAWN AEROBIC ONLY 5CC AEROBIC ONLY   Final   Culture   Final           BLOOD CULTURE RECEIVED NO GROWTH TO DATE CULTURE WILL BE HELD FOR 5 DAYS BEFORE ISSUING A FINAL NEGATIVE REPORT Performed at [facility]    Report Status PENDING  Incomplete  Clostridium Difficile by PCR     Status: None   Collection Time: 04/20/14  1:29 AM  Result Value Ref Range Status   C difficile by pcr NEGATIVE NEGATIVE Final     Studies: Pelvis Portable  04/21/2014   CLINICAL DATA:  Post op right femur surgery today.  EXAM: PORTABLE PELVIS 1-2 VIEWS  COMPARISON:  Earlier films of the same day  FINDINGS: Interval placement of an IM rod into  the femoral shaft and interlocking screw into the femoral neck, transfixing the comminuted intertrochanteric fracture in near anatomic alignment. Distal aspect of the IM rod not visualized. Degenerative changes in bilateral hips, right greater than left. Mild diffuse osteopenia. Patchy femoral vascular calcifications.  IMPRESSION: 1. Internal fixation of right intertrochanteric fracture in near anatomic alignment.   Electronically Signed   By: Corlis Leak M.D.   On: 04/21/2014 15:38   Dg C-arm 61-120 Min-no Report  04/21/2014   CLINICAL DATA: surgery   C-ARM 61-120 MINUTES  Fluoroscopy was utilized by the requesting physician.  No radiographic  interpretation.    Dg Femur, Min 2 Views Right  04/21/2014   CLINICAL DATA:  ORIF of a right proximal femur fracture.  EXAM: RIGHT FEMUR 2 VIEWS  COMPARISON:  04/17/2014  FINDINGS: Five submitted images show placement of an intra medullary rod spanning most of the right femur from the greater trochanter to the distal metaphysis. The spans the intertrochanteric fracture. The intra medullary rod supports a compression screw spanning the intertrochanteric fracture in to the femoral head. The orthopedic hardware is well-seated. The major fracture components are reduced into near anatomic alignment.  There is no new fracture or evidence of an operative complication.  IMPRESSION: Near-anatomic alignment of fracture fragments following ORIF of the proximal right femur fracture.   Electronically Signed   By: Amie Portland M.D.   On: 04/21/2014 13:41   Dg Femur, Min 2 Views Right  04/21/2014   CLINICAL DATA:  Preop imaging prior to fracture repair.  EXAM: RIGHT  FEMUR 2 VIEWS  COMPARISON:  04/17/2014  FINDINGS: Proximal right intertrochanteric femur fracture is in noted. An oblique fracture extends from the posterior lateral meta diaphysis to the anterior proximal metaphysis, with a secondary fracture across the base of the lesser trochanter. Degree of fracture displacement is  unchanged. Some angulation noted on the prior study is improved due to less hip abduction on the current exam.  Bones are demineralized. There is sclerosis along the superior femoral head suggesting chronic avascular necrosis. There is associated subchondral cystic change and sclerosis along the superior acetabular.  Bones are demineralized. Knee joint is normally aligned. There is soft tissue edema that predominates of the lateral aspect of the hip and proximal thigh.  IMPRESSION: Comminuted mildly displaced right proximal femur intertrochanteric fracture. Alignment between the major fracture fragments is improved with more neutral right leg positioning on current is study,, with the right hip mildly abducted on the prior exam. No other change.   Electronically Signed   By: Amie Portland M.D.   On: 04/21/2014 11:56   Dg Femur Port, Min 2 Views Right  04/21/2014   CLINICAL DATA:  Right proximal femur fracture.  EXAM: RIGHT FEMUR PORTABLE 1 VIEW 2:05 p.m.  COMPARISON:  Radiographs dated 04/21/2014 at 11:16 a.m.  FINDINGS: The patient has undergone open reduction and internal fixation of the proximal right femur fracture. Intramedullary nail, distal fixation screw and compression screw have been inserted. Alignment and position of the fracture fragments is near anatomic.  IMPRESSION: Satisfactory appearance of the right femur after open reduction and internal fixation of proximal fracture.Negative.   Electronically Signed   By: Francene Boyers M.D.   On: 04/21/2014 14:38    Scheduled Meds: . acetaminophen  1,000 mg Oral 4 times per day  . docusate sodium  100 mg Oral BID  . dorzolamide-timolol  1 drop Both Eyes BID  . enoxaparin (LOVENOX) injection  30 mg Subcutaneous Q24H  . feeding supplement (ENSURE ENLIVE)  237 mL Oral BID BM  . metoprolol tartrate  12.5 mg Oral BID  . pantoprazole  40 mg Oral Daily  . senna  1 tablet Oral BID  . simvastatin  20 mg Oral q1800   Continuous Infusions: . sodium  chloride 75 mL/hr at 04/22/14 0436    Active Problems:   Hyponatremia   Protein-calorie malnutrition, severe   CKD (chronic kidney disease), stage III   Acute blood loss anemia   Thrombocytopenia   Intertrochanteric fracture of right hip   Elevated troponin   Lactic acidosis   Sepsis   Pyelonephritis   Subtrochanteric fracture of femur    Time spent: >35 minutes     Esperanza Sheets  [facility] Pager [phone]. If 7PM-7AM, please contact night-coverage at [email or web address], password [facility] 04/22/2014, 8:51 AM  LOS: 5 days

## 2014-04-22 NOTE — Evaluation (Signed)
Occupational Therapy Evaluation Patient Details Name: Kimberly Avery MRN: [id number] DOB: [birth year] Today's Date: 04/22/2014    History of Present Illness 81 yo female with PMH of HTN, HPL, Gout, CKD brought to ER with vomiting, weakness, abdominal pain, lethargy. She was found to have severe hyponatremia and Rt femoral fx s/p cephalomedullary nail fixation 04/21/14.   Clinical Impression   Pt with decline in function and safety with ADLs and ADL mobility with decreased strength, balance and endurance. Pt would benefit from acute OT services to increase level of function and safety    Follow Up Recommendations  SNF;Supervision/Assistance - 24 hour    Equipment Recommendations  None recommended by OT (TBD at next venue of care)    Recommendations for Other Services       Precautions / Restrictions Precautions Precautions: Fall Restrictions Weight Bearing Restrictions: Yes RLE Weight Bearing: Touchdown weight bearing Other Position/Activity Restrictions: <30%      Mobility Bed Mobility Overal bed mobility: Needs Assistance Bed Mobility: Supine to Sit     Supine to sit: Min assist;HOB elevated     General bed mobility comments: pt up in recliner  Transfers Overall transfer level: Needs assistance Equipment used: Rolling walker (2 wheeled) Transfers: Sit to/from Stand Sit to Stand: Mod assist         General transfer comment: verbal cues for safe technique including hand placement, assist to rise and steady    Balance Overall balance assessment: History of Falls                                          ADL Overall ADL's : Needs assistance/impaired     Grooming: Wash/dry hands;Wash/dry face;Sitting;Min guard;Set up   Upper Body Bathing: Set up;Sitting;Min guard   Lower Body Bathing: Maximal assistance   Upper Body Dressing : Set up;Sitting;Min guard   Lower Body Dressing: Total assistance   Toilet Transfer: RW;BSC;Cueing for  sequencing;Cueing for safety;Maximal assistance Toilet Transfer Details (indicate cue type and reason): verbal cues for safe technique including hand placement, assist to rise and steady Toileting- Clothing Manipulation and Hygiene: Total assistance       Functional mobility during ADLs: Moderate assistance;Cueing for safety;Cueing for sequencing       Vision  no change from baseline   Perception Perception Perception Tested?: No   Praxis Praxis Praxis tested?: Not tested    Pertinent Vitals/Pain Pain Assessment: 0-10 Pain Score: 3  Pain Location: L LE Pain Descriptors / Indicators: Sore Pain Intervention(s): Limited activity within patient's tolerance;Monitored during session;Repositioned;Ice applied     Hand Dominance Right   Extremity/Trunk Assessment Upper Extremity Assessment Upper Extremity Assessment: Generalized weakness   Lower Extremity Assessment Lower Extremity Assessment: Defer to PT evaluation RLE Deficits / Details: decreased active hip movement requiring assist for bed mobility, able to perform LAQ and ankle pumps       Communication Communication Communication: No difficulties   Cognition Arousal/Alertness: Awake/alert Behavior During Therapy: WFL for tasks assessed/performed Overall Cognitive Status: Within Functional Limits for tasks assessed                     General Comments   Pt pleasant and cooperative                 Home Living Family/patient expects to be discharged to:: Private residence Living Arrangements: Alone Available Help at Discharge: Family Type  of Home: House Home Access: Stairs to enter [employer] of Steps: 3-4   Home Layout: One level     Bathroom Shower/Tub: Tub/shower unit;Walk-in shower   Bathroom Toilet: Standard     Home Equipment: None          Prior Functioning/Environment Level of Independence: Independent             OT Diagnosis: Generalized weakness   OT  Problem List: Decreased strength;Decreased knowledge of use of DME or AE;Decreased knowledge of precautions;Decreased activity tolerance;Impaired balance (sitting and/or standing);Pain   OT Treatment/Interventions: Self-care/ADL training;Therapeutic exercise;Patient/family education;Therapeutic activities;DME and/or AE instruction    OT Goals(Current goals can be found in the care plan section) Acute Rehab OT Goals Patient Stated Goal: go home OT Goal Formulation: With patient/family Time For Goal Achievement: 04/29/14 Potential to Achieve Goals: Good ADL Goals Pt Will Perform Grooming: with set-up;sitting;with supervision Pt Will Perform Upper Body Bathing: with supervision;with set-up;sitting Pt Will Perform Lower Body Bathing: with mod assist;sitting/lateral leans Pt Will Perform Upper Body Dressing: with set-up;with supervision;sitting Pt Will Transfer to Toilet: with mod assist;bedside commode  OT Frequency: Min 2X/week   Barriers to D/C: Decreased caregiver support                        End of Session Equipment Utilized During Treatment: Gait belt;Rolling walker;Other (comment) (BSC)  Activity Tolerance: Patient limited by pain Patient left: in chair;with call bell/phone within reach;with chair alarm set;with family/visitor present   Time: 1203-1228 OT Time Calculation (min): 25 min Charges:  OT General Charges $OT Visit: 1 Procedure OT Evaluation $Initial OT Evaluation Tier I: 1 Procedure OT Treatments $Therapeutic Activity: 8-22 mins G-Codes:    Galen Manila 04/22/2014, 1:57 PM

## 2014-04-22 NOTE — Evaluation (Addendum)
Physical Therapy Evaluation Patient Details Name: Kimberly Parsonsrnestine E Ireland MRN: [id number] DOB: 1923-02-02 Today's Date: 04/22/2014   History of Present Illness  81 yo female with PMH of HTN, HPL, Gout, CKD brought to ER with vomiting, weakness, abdominal pain, lethargy. She was found to have severe hyponatremia and Rt femoral fx s/p cephalomedullary nail fixation 04/21/14.  Clinical Impression  Patient is s/p above surgery resulting in functional limitations due to the deficits listed below (see PT Problem List).  Patient will benefit from skilled PT to increase their independence and safety with mobility to allow discharge to the venue listed below.  Pt educated on TDWB status which she performed well today.  She is very motivated and agreeable to SNF due to new precautions, recent femur fx (possible unwitnessed fall), and living alone.     Follow Up Recommendations SNF    Equipment Recommendations  Rolling walker with 5" wheels    Recommendations for Other Services       Precautions / Restrictions Precautions Precautions: Fall Restrictions Weight Bearing Restrictions: Yes RLE Weight Bearing: Touchdown weight bearing Other Position/Activity Restrictions: <30%      Mobility  Bed Mobility Overal bed mobility: Needs Assistance Bed Mobility: Supine to Sit     Supine to sit: Min assist;HOB elevated     General bed mobility comments: verbal cues for technique, assist for R LE  Transfers Overall transfer level: Needs assistance Equipment used: Rolling walker (2 wheeled) Transfers: Sit to/from Stand Sit to Stand: Mod assist;+2 safety/equipment         General transfer comment: verbal cues for safe technique including hand placement, assist to rise and steady  Ambulation/Gait Ambulation/Gait assistance: Mod assist;+2 safety/equipment Ambulation Distance (Feet): 16 Feet Assistive device: Rolling walker (2 wheeled) Gait Pattern/deviations: Step-to pattern     General Gait  Details: pt performing TDWB very well so ambulated short distance around room then back to recliner, more assist initially to steady however pt progressed to min assist  Stairs            Wheelchair Mobility    Modified Rankin (Stroke Patients Only)       Balance Overall balance assessment: History of Falls                                           Pertinent Vitals/Pain Pain Assessment: 0-10 Pain Score: 2  Pain Location: L thigh Pain Descriptors / Indicators: Sore Pain Intervention(s): Limited activity within patient's tolerance;Monitored during session;Repositioned;Ice applied    Home Living Family/patient expects to be discharged to:: Private residence Living Arrangements: Alone Available Help at Discharge: Family Type of Home: House Home Access: Stairs to enter   Secretary/administratorntrance Stairs-Number of Steps: 3-4 Home Layout: One level Home Equipment: None      Prior Function Level of Independence: Independent               Hand Dominance   Dominant Hand: Right    Extremity/Trunk Assessment               Lower Extremity Assessment: RLE deficits/detail RLE Deficits / Details: decreased active hip movement requiring assist for bed mobility, able to perform LAQ and ankle pumps       Communication   Communication: No difficulties  Cognition Arousal/Alertness: Awake/alert Behavior During Therapy: WFL for tasks assessed/performed Overall Cognitive Status: Within Functional Limits for tasks assessed  General Comments      Exercises        Assessment/Plan    PT Assessment Patient needs continued PT services  PT Diagnosis Difficulty walking;Acute pain   PT Problem List Decreased strength;Decreased balance;Decreased mobility;Decreased knowledge of precautions;Pain;Decreased knowledge of use of DME  PT Treatment Interventions Gait training;DME instruction;Functional mobility training;Patient/family  education;Therapeutic activities;Therapeutic exercise   PT Goals (Current goals can be found in the Care Plan section) Acute Rehab PT Goals PT Goal Formulation: With patient Time For Goal Achievement: 04/29/14 Potential to Achieve Goals: Good    Frequency Min 3X/week   Barriers to discharge        Co-evaluation               End of Session Equipment Utilized During Treatment: Gait belt Activity Tolerance: Patient tolerated treatment well Patient left: in chair;with call bell/phone within reach;with chair alarm set Nurse Communication: Mobility status;Weight bearing status         Time: 9811-9147 PT Time Calculation (min) (ACUTE ONLY): 21 min   Charges:   PT Evaluation $Initial PT Evaluation Tier I: 1 Procedure     PT G Codes:        Tenita Cue,KATHrine E 04/22/2014, 12:31 PM Zenovia Jarred, PT, DPT 04/22/2014 Pager: [phone]

## 2014-04-22 NOTE — Op Note (Addendum)
NAMEMarland Kitchen  Kimberly Avery, Kimberly Avery NO.:  [id number]  MEDICAL RECORD NO.:  [id number]  LOCATION:  1427                         FACILITY:  [facility]  PHYSICIAN:  Kimberly Frederic, MD     DATE OF BIRTH:  [birth year]  DATE OF PROCEDURE:  04/21/2014 DATE OF DISCHARGE:                              OPERATIVE REPORT   SURGEON:  Kimberly Frederic, MD  ASSISTANT:  None.  PREOPERATIVE DIAGNOSIS:  Reverse obliquity intertrochanteric femur fracture with basicervical extension, right femur.  POSTOPERATIVE DIAGNOSIS:  Reverse obliquity intertrochanteric femur fracture with basicervical extension, right femur.  PROCEDURE PERFORMED:  Cephalomedullary nail fixation of right pertrochanteric femur fracture.  POSTOP IMPLANTS: 1. Stryker gamma 3 nail 11 x 380 mm x 125 degrees. 2. 10.5 x 85 mm titanium lag screw. 3. 5 mm distal interlocking screw x1.  ANESTHESIA:  General.  EBL: 150 mL.  COMPLICATIONS:  None.  TUBES AND DRAINS:  None.  DISPOSITION:  Stable to PACU.  SPECIMENS:  None.  ANTIBIOTICS:  2 g Ancef.  INDICATIONS:  The patient is an 81 year old female who had a mechanical fall at home.  She came to the hospital, was confused.  X-rays revealed a pertrochanteric femur fracture.  She was admitted to the ICU and found to have very low sodium.  Her sodium was corrected over the next few days. Risks, benefits, and alternatives to the above-mentioned procedure were explained.  The patient and her family elected to proceed.  DESCRIPTION OF PROCEDURE IN DETAIL:  The patient was correctly identified in the preop holding area using 2 identifiers.  Surgical site was marked by myself.  The patient was taken to the operating room and general anesthesia was induced on her bed.  She already had a Foley catheter.  She was then transferred to the [facility] table.  The nonoperative extremity was scissored underneath.  The fracture was reduced with traction, adduction, and internal rotation.  AP and  lateral fluoroscopy views were used to confirm fracture reduction.  The right lower extremity was then prepped and draped in a sterile surgical fashion. Time-out was called verifying site and site of surgery.  She did receive IV antibiotics within 60 minutes of beginning the procedure.  I began by using fluoroscopy to define her anatomy.  I made a 4 cm incision proximal to the tip of the greater trochanter.  I used a guide pin to establish the standard trochanteric entry site.  I then used an entry reamer.  I placed ball-tip guidewire down to the physeal scar of the knee.  I measured the length of the nail.  I then measured up to a 13 mm reamer with excellent chatter.  I placed the real nail. Through a separate stab incision, I placed cannula for the lag screw. Using AP and lateral fluoroscopy views, I placed the guide pin for the cephalomedullary screw.  I then measured, reamed, and inserted the screw.  Tip apex distance was appropriate.  I then inserted the set screw and this was tightened.  Her basicervical fracture was anatomically reduced.  Her subtroch fracture was near anatomically reduced.  I then let off all the traction.  I then gently impacted her foot.  I then used a perfect  circle technique to place 1 distal interlocking screw.  Final AP and lateral fluoroscopy views were obtained.  All wounds were copiously irrigated with saline.  I closed the wounds in layers using #1 Vicryl for the fascia, 2-0 Monocryl for the deep dermis, and 3-0 running Monocryl subcuticular stitch. Dermabond was applied to the skin.  Once the glue was dried, sterile dressings were applied.  The patient was then taken off the [facility] table, extubated, and transferred to the PACU in stable condition.  Sponge, needle, instrument counts were correct at the end of the case x2.  There were no known complications.  I discussed operative events and findings with the patient's family. The plan will be for  touchdown weightbearing to the right lower extremity for 6 weeks with a walker.  We will give her Lovenox for DVT prophylaxis for 30 days.  Once the 30 days of Lovenox has been completed, she will resume her 81 mg of aspirin.  She will work with physical and occupational therapy.  I will see her back in the office in 2 weeks for wound check.  All questions were solicited and answered to satisfaction.          ______________________________ Kimberly FredericBrian Twain Stenseth, MD     BS/MEDQ  D:  04/21/2014  T:  04/22/2014  Job:  [id number]

## 2014-04-22 NOTE — Progress Notes (Signed)
Clinical Social Work Department BRIEF PSYCHOSOCIAL ASSESSMENT 04/22/2014  Patient:  Kimberly Avery, Kimberly Avery     Account Number:  [id number]     Admit date:  04/17/2014  Clinical Social Worker:  Glorious Peach, CLINICAL SOCIAL WORKER  Date/Time:  04/22/2014 09:59 AM  Referred by:  Physician  Date Referred:  04/22/2014 Referred for  SNF Placement   Other Referral:   Interview type:  Patient Other interview type:   [name] at bedside    PSYCHOSOCIAL DATA Living Status:  ALONE Admitted from facility:   Level of care:   Primary support name:  Kimberly Avery Primary support relationship to patient:  CHILD, ADULT Degree of support available:   Adequate    CURRENT CONCERNS Current Concerns  Post-Acute Placement   Other Concerns:    SOCIAL WORK ASSESSMENT / PLAN CSW received consult stating that pt can benefit from short term rehab at a SNF. CSW completed FL2 and faxed pt out to [facility] for bed offers.   Assessment/plan status:  Information/Referral to [employer] Other assessment/ plan:   Information/referral to community resources:   CSW completed FL2 and faxed information out to [facility] [place] has list of bed offers for pt and family to choose from.    PATIENT'S/FAMILY'S RESPONSE TO PLAN OF CARE: CSW met with pt at bedside along with pt's daughter, Kimberly Avery. CSW informed pt that PT recommends that pt goes to a SNF for short term rehab. Pt's daughter, Kimberly Avery states that they believe that is the best thing for pt at this time. CSW gave pt a list of SNF that have made bed offers. [name]) and pt states that they would like to do some research on SNFs that have made a bed offer and will get back with CSW first thing tomorrow morning. CSW will continue to follow.       Glorious Peach BSW Intern

## 2014-04-22 NOTE — Progress Notes (Signed)
   Subjective:  Patient reports pain as mild.  No c/o. Denies N/V/CP/SOB.  Objective:   VITALS:   Filed Vitals:   04/22/14 0000 04/22/14 0400 04/22/14 0452 04/22/14 0500  BP:   131/55   Pulse:   60   Temp:   98 F (36.7 C)   TempSrc:   Oral   Resp: 14 14 16    Height:      Weight:    48.2 kg (106 lb 4.2 oz)  SpO2: 97% 99% 100%    Abd SNTND Sensation intact distally Intact pulses distally Dorsiflexion/Plantar flexion intact Incision: dressing C/D/I Compartment soft   Lab Results  Component Value Date   WBC 11.3* 04/22/2014   HGB 7.8* 04/22/2014   HCT 22.1* 04/22/2014   MCV 83.7 04/22/2014   PLT 181 04/22/2014   BMET    Component Value Date/Time   NA 130* 04/22/2014 0100   K 3.9 04/22/2014 0100   CL 104 04/22/2014 0100   CO2 19 04/22/2014 0100   GLUCOSE 175* 04/22/2014 0100   BUN 17 04/22/2014 0100   CREATININE 1.24* 04/22/2014 0100   CALCIUM 7.3* 04/22/2014 0100   GFRNONAA 37* 04/22/2014 0100   GFRAA 43* 04/22/2014 0100     Assessment/Plan: 1 Day Post-Op   Active Problems:   Hyponatremia   Protein-calorie malnutrition, severe   CKD (chronic kidney disease), stage III   Acute blood loss anemia   Thrombocytopenia   Intertrochanteric fracture of right hip   Elevated troponin   Lactic acidosis   Sepsis   Pyelonephritis   Subtrochanteric fracture of femur    TDWB RLE with walker DVT ppx: lovenox x30 days, SCDs, TEDs PO pain control, avoid narcotics PT/OT, mobilize OOB Dispo: d/c planning   Kimberly Avery, Kimberly Avery 04/22/2014, 7:10 AM   Kimberly FredericBrian Winnifred Dufford, MD Cell [phone]

## 2014-04-22 NOTE — Progress Notes (Signed)
Pt due to void after foley removed 7 hours a go. Was unable to go on bedpan.  Bladder scan performed, showed 250 cc's.  Will get up to bsc after dinner.  Will monitor closely.

## 2014-04-23 DIAGNOSIS — S72141D Displaced intertrochanteric fracture of right femur, subsequent encounter for closed fracture with routine healing: Secondary | ICD-10-CM

## 2014-04-23 LAB — MAGNESIUM: MAGNESIUM: 1.7 mg/dL (ref 1.5–2.5)

## 2014-04-23 LAB — CULTURE, BLOOD (ROUTINE X 2)
Culture: NO GROWTH
Culture: NO GROWTH

## 2014-04-23 LAB — BASIC METABOLIC PANEL
Anion gap: 6 (ref 5–15)
BUN: 20 mg/dL (ref 6–23)
CHLORIDE: 106 mmol/L (ref 96–112)
CO2: 20 mmol/L (ref 19–32)
Calcium: 7.9 mg/dL — ABNORMAL LOW (ref 8.4–10.5)
Creatinine, Ser: 1.45 mg/dL — ABNORMAL HIGH (ref 0.50–1.10)
GFR calc Af Amer: 36 mL/min — ABNORMAL LOW (ref >90)
GFR, EST NON AFRICAN AMERICAN: 31 mL/min — AB (ref >90)
GLUCOSE: 107 mg/dL — AB (ref 70–99)
POTASSIUM: 3.8 mmol/L (ref 3.5–5.1)
SODIUM: 132 mmol/L — AB (ref 135–145)

## 2014-04-23 LAB — CBC
HEMATOCRIT: 21.8 % — AB (ref 36.0–46.0)
Hemoglobin: 7.5 g/dL — ABNORMAL LOW (ref 12.0–15.0)
MCH: 29.2 pg (ref 26.0–34.0)
MCHC: 34.4 g/dL (ref 30.0–36.0)
MCV: 84.8 fL (ref 78.0–100.0)
Platelets: 230 10*3/uL (ref 150–400)
RBC: 2.57 MIL/uL — ABNORMAL LOW (ref 3.87–5.11)
RDW: 16 % — AB (ref 11.5–15.5)
WBC: 14.1 10*3/uL — ABNORMAL HIGH (ref 4.0–10.5)

## 2014-04-23 LAB — PHOSPHORUS: PHOSPHORUS: 2.3 mg/dL (ref 2.3–4.6)

## 2014-04-23 NOTE — Progress Notes (Signed)
Progress Note   Kimberly Avery:[id number] DOB: 1923-04-04 DOA: 04/17/2014 PCP: Hollice Espy, MD   Brief Narrative:   Kimberly Avery is an 81 y.o. female who lives alone and was admitted with worsening of her chronic diarrhea and vomiting. Her daughter noted that she was more lethargic than usual. She was brought to the ED for further evaluation where she told the EDP that she had fallen and a further workup revealed a distal right femur fracture. Further workup revealed a serum lactate of 7, sodium of 104, troponin 1.25, potassium 2.9. CT showed possible pyelonephritis. She was placed on empiric vancomycin and Zosyn and consultations were provided by critical care and orthopedics. Patient underwent nail fixation of her right femur fracture 04/22/14 when her sodium improved to 130. Blood and urine cultures have remained negative however the patient's clinical status has improved on antibiotics which have been narrowed to Rocephin. TRH assumed care 04/21/14.  Assessment/Plan:   Principal Problem:   Sepsis secondary to pyelonephritis with lactic acidosis - Blood and urine cultures remain negative. - Continue Rocephin for presumed pyelonephritis given CT findings of inflammation around left kidney.  Active Problems:   Hyponatremia - Sodium was 106 on admission, felt to be a combination of hypovolemia in the setting of diuretic use/indapamide (discontinued). - Corrected with normal saline.    Protein-calorie malnutrition, severe / underweight - Pt meets criteria for severe MALNUTRITION in the context of chronic illness as evidenced by severe fat and muscle wasting. - Continue nutritional supplements as recommended by dietitian.    CKD (chronic kidney disease), stage III - Patient's baseline creatinine is not known. - GFR consistent with stage III chronic kidney disease.    Multifactorial anemia/Acute blood loss anemia - Patient likely has anemia of chronic disease  superimposed on acute blood loss from fracture (hemorrhage noted on CT)/operative repair. - Received 1 unit of blood 04/18/14. Hemoglobin stable postoperatively.    Thrombocytopenia - Mild, resolved.    Intertrochanteric fracture of right hip - Status post cephalic medullary nail fixation of right peritrochanteric femur fracture 04/22/14.    Elevated troponin - Likely demand ischemia in the setting of severe illness. Patient denied chest pain. - 2-D echo done 04/20/14, no evidence of regional wall motion abnormalities. EF 55-60 percent.     Hypertension  - Continue metoprolol. Diuretics discontinued.    Acute encephalopathy - Secondary to severe illness. Resolved.    DVT Prophylaxis - Continue Lovenox.  Code Status: Full. Family Communication: Daughter updated by telephone. Disposition Plan: Status post PT evaluation 04/22/14, SNF recommended.   IV Access:    Peripheral IV   Procedures and diagnostic studies:   Ct Abdomen Pelvis Wo Contrast 04/17/2014: 1. Findings suspicious for pyelonephritis of the upper left kidney. 2. Comminuted mildly displaced right proximal femur fracture, primarily intertrochanteric with the subtrochanteric component. Associated hemorrhage, some of which tracks in the distal iliopsoas muscle.     Dg Chest 2 View 04/17/2014: COPD.  No active disease.    Ct Head Wo Contrast 04/17/2014: Age-related atrophy and chronic small vessel ischemic change. No acute intracranial abnormality.    Dg Hip Unilat With Pelvis 2-3 Views Right 04/17/2014: Comminuted proximal femur fracture involving the intertrochanteric and subtrochanteric femur.  2-D echo 04/20/14: Moderate concentric LV hypertrophy. EF 55-60 percent. No regional wall motion abnormalities. Grade 1 diastolic dysfunction. Mild aortic valve regurgitation. Mild mitral valve regurgitation. Moderate tricuspid valve regurgitation.  Pelvis Portable 04/21/2014: 1. Internal fixation of right intertrochanteric fracture  in near anatomic alignment.    Dg Femur, Min 2 Views Right 04/21/2014: Near-anatomic alignment of fracture fragments following ORIF of the proximal right femur fracture.     Dg Femur, Min 2 Views Right 04/21/2014: Comminuted mildly displaced right proximal femur intertrochanteric fracture. Alignment between the major fracture fragments is improved with more neutral right leg positioning on current is study,, with the right hip mildly abducted on the prior exam. No other change.    Dg Femur [place] Views Right 04/21/2014: Satisfactory appearance of the right femur after open reduction and internal fixation of proximal fracture.Negative.     Medical Consultants:    Jene EveryJeffrey Beane, MD, Orthopedic Surgery  Pulmonary critical care medicine  Anti-Infectives:    Ceftriaxone 04/17/14---> 04/21/14  Subjective:   Mancel ParsonsErnestine E Hirano complains of belching problems and "gas", longstanding.  Appetite fair.  Right thigh discomfort from recent surgery persists.  No nausea or vomiting.  Objective:    Filed Vitals:   04/22/14 2147 04/23/14 0000 04/23/14 0411 04/23/14 0421  BP: 141/61  118/58   Pulse: 71  98   Temp:   98.2 F (36.8 C)   TempSrc:   Oral   Resp:  16 16 16   Height:      Weight:   49.9 kg (110 lb 0.2 oz)   SpO2:  100% 100% 100%    Intake/Output Summary (Last 24 hours) at 04/23/14 0741 Last data filed at 04/23/14 0700  Gross per 24 hour  Intake    900 ml  Output    725 ml  Net    175 ml    Exam: Gen:  NAD, sitting up in chair Cardiovascular:  RRR, No M/R/G Respiratory:  Lungs CTAB Gastrointestinal:  Abdomen soft, NT/ND, + BS Extremities:  Right thigh swollen   Data Reviewed:    Labs: Basic Metabolic Panel:  Recent Labs Lab 04/18/14 0812  04/20/14 0340  04/21/14 0055 04/21/14 0820 04/21/14 1655 04/22/14 0100 04/22/14 0830 04/23/14 0523  NA 117*  < > 127*  < > 132* 133* 135 130* 134* 132*  K 3.5  < > 3.2*  < > 3.6 3.5 3.6 3.9 3.8 3.8  CL 90*  < > 100   < > 104 103 107 104 106 106  CO2 18*  < > 20  < > 20 22 19 19 20 20   GLUCOSE 102*  < > 134*  < > 136* 113* 147* 175* 131* 107*  BUN 30*  < > 31*  < > 23 20 17 17 18 20   CREATININE 1.47*  < > 1.18*  < > 1.21* 1.16* 1.14* 1.24* 1.34* 1.45*  CALCIUM 6.9*  < > 7.5*  < > 7.7* 7.8* 7.6* 7.3* 7.6* 7.9*  MG QUANTITY NOT SUFFICIENT, UNABLE TO PERFORM TEST  --  2.2  --  2.2  --   --  1.8  --  1.7  PHOS QUANTITY NOT SUFFICIENT, UNABLE TO PERFORM TEST  --  1.6*  --  1.5*  --   --  2.4  --  2.3  < > = values in this interval not displayed. GFR Estimated Creatinine Clearance: 20.7 mL/min (by C-G formula based on Cr of 1.45). Liver Function Tests:  Recent Labs Lab 04/17/14 0146 04/18/14 0812  AST 125* 167*  ALT 38* 49*  ALKPHOS 63 46  BILITOT 1.0 0.6  PROT 6.6 5.2*  ALBUMIN 3.7 3.0*    Recent Labs Lab 04/17/14 0146  LIPASE 42   Coagulation profile  Recent Labs Lab 04/17/14 0146  INR 1.08    CBC:  Recent Labs Lab 04/17/14 0146  04/20/14 1012 04/21/14 0055 04/21/14 1655 04/22/14 0100 04/23/14 0523  WBC 8.5  < > 10.1 13.0* 13.2* 11.3* 14.1*  NEUTROABS 7.2  --   --   --   --   --   --   HGB 11.2*  < > 7.1* 8.6* 9.4* 7.8* 7.5*  HCT 31.3*  < > 20.1* 24.6* 27.0* 22.1* 21.8*  MCV 78.3  < > 79.8 82.6 84.4 83.7 84.8  PLT 143*  < > 160 165 202 181 230  < > = values in this interval not displayed. Cardiac Enzymes:  Recent Labs Lab 04/20/14 0340 04/20/14 1012  TROPONINI 0.22* 0.18*   CBG:  Recent Labs Lab 04/17/14 0152 04/17/14 0749 04/17/14 1229 04/17/14 1705 04/18/14 0811  GLUCAP 214* 130* 105* 120* 108*   Anemia work up:  Recent Labs  04/20/14 1012  VITAMINB12 >2000*  FOLATE 11.9  FERRITIN 165  TIBC 276  IRON 25*  RETICCTPCT 3.6*   Sepsis Labs:  Recent Labs Lab 04/17/14 0414  04/17/14 0628 04/17/14 1529  04/20/14 0340  04/21/14 0055 04/21/14 1655 04/22/14 0100 04/23/14 0523  WBC  --   < >  --   --   < >  --   < > 13.0* 13.2* 11.3* 14.1*    LATICACIDVEN 5.70*  --  3.7* 2.3*  --  1.2  --   --   --   --   --   < > = values in this interval not displayed. Microbiology Recent Results (from the past 240 hour(s))  Urine culture     Status: None   Collection Time: 04/17/14  1:54 AM  Result Value Ref Range Status   Specimen Description URINE, CATHETERIZED  Final   Special Requests NONE  Final   Colony Count NO GROWTH Performed at [facility]   Final   Culture NO GROWTH Performed at [facility]   Final   Report Status 04/18/2014 FINAL  Final  Culture, blood (routine x 2)     Status: None (Preliminary result)   Collection Time: 04/17/14  2:37 AM  Result Value Ref Range Status   Specimen Description BLOOD R BRACHIAL  Final   Special Requests BOTTLES DRAWN AEROBIC AND ANAEROBIC  Final   Culture   Final           BLOOD CULTURE RECEIVED NO GROWTH TO DATE CULTURE WILL BE HELD FOR 5 DAYS BEFORE ISSUING A FINAL NEGATIVE REPORT Performed at [facility]    Report Status PENDING  Incomplete  MRSA PCR Screening     Status: None   Collection Time: 04/17/14  9:45 AM  Result Value Ref Range Status   MRSA by PCR NEGATIVE NEGATIVE Final    Comment:        The GeneXpert MRSA Assay (FDA approved for NASAL specimens only), is one component of a comprehensive MRSA colonization surveillance program. It is not intended to diagnose MRSA infection nor to guide or monitor treatment for MRSA infections.   Culture, blood (routine x 2)     Status: None (Preliminary result)   Collection Time: 04/17/14 10:45 AM  Result Value Ref Range Status   Specimen Description BLOOD RIGHT ARM  Final   Special Requests BOTTLES DRAWN AEROBIC ONLY 5CC AEROBIC ONLY   Final   Culture   Final  BLOOD CULTURE RECEIVED NO GROWTH TO DATE CULTURE WILL BE HELD FOR 5 DAYS BEFORE ISSUING A FINAL NEGATIVE REPORT Performed at [facility]    Report Status PENDING  Incomplete  Clostridium Difficile by PCR     Status: None    Collection Time: 04/20/14  1:29 AM  Result Value Ref Range Status   C difficile by pcr NEGATIVE NEGATIVE Final     Medications:   . docusate sodium  100 mg Oral BID  . dorzolamide-timolol  1 drop Both Eyes BID  . enoxaparin (LOVENOX) injection  30 mg Subcutaneous Q24H  . feeding supplement (ENSURE ENLIVE)  237 mL Oral BID BM  . metoprolol tartrate  12.5 mg Oral BID  . pantoprazole  40 mg Oral Daily  . senna  1 tablet Oral BID  . simvastatin  20 mg Oral q1800   Continuous Infusions:   Time spent: 25 minutes.   LOS: 6 days   Rudie Sermons  [facility] Pager [phone]. If unable to reach me by pager, please call my cell phone at [phone].  *Please refer to [email or web address], password [email or web address] to get updated schedule on who will round on this patient, as hospitalists switch teams weekly. If 7PM-7AM, please contact night-coverage at [email or web address], password [email or web address] for any overnight needs.  04/23/2014, 7:41 AM

## 2014-04-23 NOTE — Progress Notes (Signed)
CSW spoke with patient & daughter, Dennie Bibleat via phone (cell#: [phone]) & provided SNF bed offers. Dennie Bibleat states that she has done her research & chose [facility]. [name] [facility] aware.   If patient is ready over the weekend, please contact weekend CSW, Rene KocherRegina (ph#: [phone]) to faciliate discharge.   Clinical Social Work Department CLINICAL SOCIAL WORK PLACEMENT NOTE 04/23/2014  Patient:  [name],Kimberly E  Account Number:  [id number] Admit date:  04/17/2014  Clinical Social Worker:  Orpah GreekKELLY FOLEY, LCSWA  Date/time:  04/23/2014 02:57 PM  Clinical Social Work is seeking post-discharge placement for this patient at the following level of care:   SKILLED NURSING   (*CSW will update this form in Epic as items are completed)   04/23/2014  Patient/family provided with [facility] of Clinical Social Work's list of facilities offering this level of care within the geographic area requested by the patient (or if unable, by the patient's family).  04/23/2014  Patient/family informed of their freedom to choose among providers that offer the needed level of care, that participate in Medicare, Medicaid or managed care program needed by the patient, have an available bed and are willing to accept the patient.  04/23/2014  Patient/family informed of [facility]' ownership interest in [facility], as well as of the fact that they are under no obligation to receive care at this facility.  PASARR submitted to EDS on 04/23/2014 PASARR number received on 04/23/2014  FL2 transmitted to all facilities in geographic area requested by pt/family on  04/23/2014 FL2 transmitted to all facilities within larger geographic area on   Patient informed that his/her managed care company has contracts with or will negotiate with  certain facilities, including the following:     Patient/family informed of bed offers received:  04/23/2014 [facility] Physician  recommends and patient chooses bed at    [facility]   Patient to be transferred to facility by  Patient and family notified of transfer on  Name of family member notified:    The following physician request were entered in Epic:   Additional Comments:    Lincoln MaxinKelly Gradyn Shein, LCSW [facility] Clinical Social Worker cell #: [phone]

## 2014-04-23 NOTE — Progress Notes (Signed)
   Subjective:  Patient reports pain as mild.  No c/o. Denies N/V/CP/SOB.  Objective:   VITALS:   Filed Vitals:   04/22/14 2147 04/23/14 0000 04/23/14 0411 04/23/14 0421  BP: 141/61  118/58   Pulse: 71  98   Temp:   98.2 F (36.8 C)   TempSrc:   Oral   Resp:  16 16 16   Height:      Weight:   49.9 kg (110 lb 0.2 oz)   SpO2:  100% 100% 100%   Abd SNTND Sensation intact distally Intact pulses distally Dorsiflexion/Plantar flexion intact Incision: dressing C/D/I Compartment soft   Lab Results  Component Value Date   WBC 14.1* 04/23/2014   HGB 7.5* 04/23/2014   HCT 21.8* 04/23/2014   MCV 84.8 04/23/2014   PLT 230 04/23/2014   BMET    Component Value Date/Time   NA 132* 04/23/2014 0523   K 3.8 04/23/2014 0523   CL 106 04/23/2014 0523   CO2 20 04/23/2014 0523   GLUCOSE 107* 04/23/2014 0523   BUN 20 04/23/2014 0523   CREATININE 1.45* 04/23/2014 0523   CALCIUM 7.9* 04/23/2014 0523   GFRNONAA 31* 04/23/2014 0523   GFRAA 36* 04/23/2014 0523     Assessment/Plan: 2 Days Post-Op   Principal Problem:   Sepsis Active Problems:   Hyponatremia   Protein-calorie malnutrition, severe   CKD (chronic kidney disease), stage III   Acute blood loss anemia   Thrombocytopenia   Intertrochanteric fracture of right hip   Elevated troponin   Lactic acidosis   Pyelonephritis   Subtrochanteric fracture of femur    TDWB RLE with walker DVT ppx: lovenox x30 days, SCDs, TEDs PO pain control, avoid narcotics PT/OT, mobilize OOB Dispo: d/c planning   Kimberly Avery, Kimberly Avery 04/23/2014, 8:47 AM   Kimberly FredericBrian Jani Moronta, MD Cell [phone]

## 2014-04-24 DIAGNOSIS — S7221XS Displaced subtrochanteric fracture of right femur, sequela: Secondary | ICD-10-CM

## 2014-04-24 LAB — CBC
HCT: 21.9 % — ABNORMAL LOW (ref 36.0–46.0)
HEMOGLOBIN: 7.5 g/dL — AB (ref 12.0–15.0)
MCH: 29.4 pg (ref 26.0–34.0)
MCHC: 34.2 g/dL (ref 30.0–36.0)
MCV: 85.9 fL (ref 78.0–100.0)
Platelets: 247 10*3/uL (ref 150–400)
RBC: 2.55 MIL/uL — AB (ref 3.87–5.11)
RDW: 16.8 % — ABNORMAL HIGH (ref 11.5–15.5)
WBC: 10.9 10*3/uL — ABNORMAL HIGH (ref 4.0–10.5)

## 2014-04-24 LAB — BASIC METABOLIC PANEL
Anion gap: 6 (ref 5–15)
BUN: 21 mg/dL (ref 6–23)
CO2: 22 mmol/L (ref 19–32)
CREATININE: 1.29 mg/dL — AB (ref 0.50–1.10)
Calcium: 7.7 mg/dL — ABNORMAL LOW (ref 8.4–10.5)
Chloride: 106 mmol/L (ref 96–112)
GFR calc Af Amer: 41 mL/min — ABNORMAL LOW (ref >90)
GFR, EST NON AFRICAN AMERICAN: 36 mL/min — AB (ref >90)
Glucose, Bld: 104 mg/dL — ABNORMAL HIGH (ref 70–99)
Potassium: 3.7 mmol/L (ref 3.5–5.1)
Sodium: 134 mmol/L — ABNORMAL LOW (ref 135–145)

## 2014-04-24 LAB — TYPE AND SCREEN
ABO/RH(D): O POS
Antibody Screen: NEGATIVE
UNIT DIVISION: 0
Unit division: 0
Unit division: 0

## 2014-04-24 MED ORDER — SENNA 8.6 MG PO TABS: 1.0000 | ORAL_TABLET | Freq: Two times a day (BID) | ORAL | Status: AC

## 2014-04-24 MED ORDER — DOCUSATE SODIUM 100 MG PO CAPS: 100.0000 mg | ORAL_CAPSULE | Freq: Two times a day (BID) | ORAL | Status: AC

## 2014-04-24 MED ORDER — ENSURE ENLIVE PO LIQD: 237.0000 mL | Freq: Two times a day (BID) | ORAL | Status: DC

## 2014-04-24 MED ORDER — ACETAMINOPHEN 325 MG PO TABS: 650.0000 mg | ORAL_TABLET | ORAL | Status: AC | PRN

## 2014-04-24 MED ORDER — ALPRAZOLAM 0.5 MG PO TABS: 0.2500 mg | ORAL_TABLET | Freq: Every evening | ORAL | Status: DC | PRN

## 2014-04-24 MED ORDER — ENOXAPARIN SODIUM 30 MG/0.3ML ~~LOC~~ SOLN: 30.0000 mg | SUBCUTANEOUS | Status: DC

## 2014-04-24 MED ORDER — TRAMADOL HCL 50 MG PO TABS: 50.0000 mg | ORAL_TABLET | Freq: Four times a day (QID) | ORAL | Status: DC | PRN

## 2014-04-24 NOTE — Progress Notes (Signed)
Mancel Parsonsrnestine E Dedic  MRN: [id number] DOB/Age: [birth year] 81 y.o. Physician: Lynnea MaizesK Maryalice Pasley, M.D. 3 Days Post-Op Procedure(s) (LRB): right proximal femur ORIF  (Right)  Subjective: Resting comfortably, good appetite, denies hip pain Vital Signs Temp:  [98.2 F (36.8 C)-98.5 F (36.9 C)] 98.3 F (36.8 C) (04/02 0434) Pulse Rate:  [68-80] 68 (04/02 0434) Resp:  [16-18] 18 (04/02 0800) BP: (135-136)/(56-66) 135/66 mmHg (04/02 0434) SpO2:  [98 %-100 %] 100 % (04/02 0434) Weight:  [47.6 kg (104 lb 15 oz)] 47.6 kg (104 lb 15 oz) (04/02 0434)  Lab Results  Recent Labs  04/23/14 0523 04/24/14 0419  WBC 14.1* 10.9*  HGB 7.5* 7.5*  HCT 21.8* 21.9*  PLT 230 247   BMET  Recent Labs  04/23/14 0523 04/24/14 0419  NA 132* 134*  K 3.8 3.7  CL 106 106  CO2 20 22  GLUCOSE 107* 104*  BUN 20 21  CREATININE 1.45* 1.29*  CALCIUM 7.9* 7.7*   INR  Date Value Ref Range Status  04/17/2014 1.08 0.00 - 1.49 Final     Exam  Ortho exam stable  Plan D/c planning, anticipate SNF  Riyah Bardon M 04/24/2014, 8:52 AM

## 2014-04-24 NOTE — Progress Notes (Signed)
CARE MANAGEMENT NOTE 04/24/2014  Patient:  [name],Esme E   Account Number:  [id number]  Date Initiated:  04/24/2014  Documentation initiated by:  [facility]  Subjective/Objective Assessment:   sepsis     Action/Plan:   Anticipated DC Date:  04/24/2014   Anticipated DC Plan:  SKILLED NURSING FACILITY  In-house referral  Clinical Social Worker      DC Planning Services  CM consult      Choice offered to / List presented to:             Status of service:  Completed, signed off Medicare Important Message given?  YES (If response is "NO", the following Medicare IM given date fields will be blank) Date Medicare IM given:  04/24/2014 Medicare IM given by:  [facility]Karla [place] Date Additional Medicare IM given:   Additional Medicare IM given by:    Discharge Disposition:  SKILLED NURSING FACILITY  Per UR Regulation:    If discussed at Long Length of Stay Meetings, dates discussed:    Comments:  04/24/2014 1018 Chart reviewed. Scheduled dc to SNF. Isidoro DonningAlesia Kaelan Emami RN CCM Case Mgmt phone [phone]

## 2014-04-24 NOTE — Progress Notes (Signed)
Physical Therapy Treatment Patient Details Name: Kimberly Avery MRN: [id number] DOB: 08/11/1923 Today's Date: 04/24/2014    History of Present Illness 81 yo female with PMH of HTN, HPL, Gout, CKD brought to ER with vomiting, weakness, abdominal pain, lethargy. She was found to have severe hyponatremia and Rt femoral fx s/p cephalomedullary nail fixation 04/21/14.    PT Comments    Pt appears more confused today however able to tolerate short distance ambulation with assist and max cues for safety and technique.  Pt likely to d/c to SNF today.  Follow Up Recommendations  SNF     Equipment Recommendations  Rolling walker with 5" wheels    Recommendations for Other Services       Precautions / Restrictions Precautions Precautions: Fall Restrictions Weight Bearing Restrictions: Yes RLE Weight Bearing: Touchdown weight bearing Other Position/Activity Restrictions: <30%    Mobility  Bed Mobility Overal bed mobility: Needs Assistance Bed Mobility: Supine to Sit     Supine to sit: HOB elevated;Mod assist     General bed mobility comments: assist for R LE and and trunk upright  Transfers Overall transfer level: Needs assistance Equipment used: Rolling walker (2 wheeled) Transfers: Sit to/from Stand Sit to Stand: Mod assist         General transfer comment: verbal cues for safe technique including hand placement, assist to rise and steady  Ambulation/Gait Ambulation/Gait assistance: Mod assist Ambulation Distance (Feet): 30 Feet Assistive device: Rolling walker (2 wheeled) Gait Pattern/deviations: Step-to pattern     General Gait Details: pt continue to perform TDWB well however required more cues today for safety including RW distance, step length, mod assist occasionally to steady   Stairs            Wheelchair Mobility    Modified Rankin (Stroke Patients Only)       Balance                                    Cognition  Arousal/Alertness: Awake/alert Behavior During Therapy: WFL for tasks assessed/performed Overall Cognitive Status: No family/caregiver present to determine baseline cognitive functioning Area of Impairment: Orientation Orientation Level: Disoriented to;Place;Situation   Memory: Decreased recall of precautions;Decreased short-term memory         General Comments: pt able to recall TDWB however when initiating ambulation pt was unable to remember gait pattern, pt reports she was in a parking deck last night, very confused this morning, required repeating information as pt asking same questions    Exercises      General Comments        Pertinent Vitals/Pain Pain Assessment: 0-10 Pain Score: 4  Pain Location: R hip/thigh Pain Descriptors / Indicators: Sore Pain Intervention(s): Limited activity within patient's tolerance;Monitored during session;Repositioned    Home Living                      Prior Function            PT Goals (current goals can now be found in the care plan section) Progress towards PT goals: Progressing toward goals    Frequency  Min 3X/week    PT Plan Current plan remains appropriate    Co-evaluation             End of Session Equipment Utilized During Treatment: Gait belt Activity Tolerance: Patient tolerated treatment well Patient left: in bed;with call bell/phone within reach;with bed  alarm set     Time: 1001-1015 PT Time Calculation (min) (ACUTE ONLY): 14 min  Charges:  $Gait Training: 8-22 mins                    G Codes:      Kimberly Avery,Kimberly Avery 05/15/2014, 11:11 AM Zenovia Jarred, PT, DPT 2014/05/15 Pager: [phone]

## 2014-04-24 NOTE — Clinical Social Work Placement (Signed)
Clinical Social Work Department CLINICAL SOCIAL WORK PLACEMENT NOTE 04/24/2014  Patient:  Kimberly Avery,Kimberly Avery  Account Number:  [id number] Admit date:  04/17/2014  Clinical Social Worker:  Orpah GreekKELLY FOLEY, LCSWA  Date/time:  04/23/2014 02:57 PM  Clinical Social Work is seeking post-discharge placement for this patient at the following level of care:   SKILLED NURSING   (*CSW will update this form in Epic as items are completed)   04/23/2014  Patient/family provided with [facility] of Clinical Social Work's list of facilities offering this level of care within the geographic area requested by the patient (or if unable, by the patient's family).  04/23/2014  Patient/family informed of their freedom to choose among providers that offer the needed level of care, that participate in Medicare, Medicaid or managed care program needed by the patient, have an available bed and are willing to accept the patient.  04/23/2014  Patient/family informed of [facility]' ownership interest in [facility], as well as of the fact that they are under no obligation to receive care at this facility.  PASARR submitted to EDS on 04/23/2014 PASARR number received on 04/23/2014  FL2 transmitted to all facilities in geographic area requested by pt/family on  04/23/2014 FL2 transmitted to all facilities within larger geographic area on   Patient informed that his/her managed care company has contracts with or will negotiate with  certain facilities, including the following:     Patient/family informed of bed offers received:  04[facility]23/2014 [facility] recommends and patient chooses bed at    [facility]  04/[facility]2014 Patient to be transferred to facility by ambulance Patient and family notified of transfer on 04/24/2014 Name of family member notified:  Linda/Daughter  The following physician request were entered in  Epic:   Additional Comments:   .Elray Bubaegina Shwanda Soltis, LCSW [facility]  [facility]ospital Clinical Social Worker - Weekend Coverage cell #: [phone]

## 2014-04-24 NOTE — Discharge Summary (Signed)
Physician Discharge Summary  Kimberly Avery EGB:[id number] DOB: 1923-06-21 DOA: 04/17/2014  PCP: Marjorie Smolder, MD  Admit date: 04/17/2014 Discharge date: 04/24/2014   Recommendations for Outpatient Follow-Up:   1. F/U with PCP in 2 weeks, BP check, electrolytes check.   Discharge Diagnosis:   Principal Problem:    Sepsis Active Problems:    Hyponatremia    Protein-calorie malnutrition, severe    CKD (chronic kidney disease), stage III    Acute blood loss anemia    Thrombocytopenia    Intertrochanteric fracture of right hip    Elevated troponin    Lactic acidosis    Pyelonephritis    Subtrochanteric fracture of femur   Discharge Condition: Improved.  Diet recommendation:  Regular.  Activity: TDWB   History of Present Illness:   Kimberly Avery is an 81 y.o. female who lives alone and was admitted with worsening of her chronic diarrhea and vomiting. Her daughter noted that she was more lethargic than usual. She was brought to the ED for further evaluation where she told the EDP that she had fallen and a further workup revealed a distal right femur fracture. Further workup revealed a serum lactate of 7, sodium of 104, troponin 1.25, potassium 2.9. CT showed possible pyelonephritis. She was placed on empiric vancomycin and Zosyn and consultations were provided by critical care and orthopedics. Patient underwent nail fixation of her right femur fracture 04/22/14 when her sodium improved to 130. Blood and urine cultures have remained negative however the patient's clinical status has improved on antibiotics which have been narrowed to Rocephin. [facility] assumed care 04/21/14.  Hospital Course by Problem:   Principal Problem:  Sepsis secondary to pyelonephritis with lactic acidosis - Blood and urine cultures remain negative. - S/P 1 week of treatment with Rocephin for pyelonephritis given CT findings of inflammation around left kidney.  Active Problems:   Hyponatremia - Sodium was 106 on admission, felt to be a combination of hypovolemia in the setting of diuretic use/indapamide (discontinued). - Corrected with normal saline.   Protein-calorie malnutrition, severe / underweight - Pt met criteria for severe MALNUTRITION in the context of chronic illness as evidenced by severe fat and muscle wasting. - Continue nutritional supplements as recommended by dietitian.   CKD (chronic kidney disease), stage III - Patient's baseline creatinine is not known. - GFR consistent with stage III chronic kidney disease.   Multifactorial anemia/Acute blood loss anemia - Patient likely has anemia of chronic disease superimposed on acute blood loss from fracture (hemorrhage noted on CT)/operative repair. - Received 1 unit of blood 04/18/14. Hemoglobin stable postoperatively.   Thrombocytopenia - Mild, resolved.   Intertrochanteric fracture of right hip - Status post cephalic medullary nail fixation of right peritrochanteric femur fracture 04/22/14.   Elevated troponin - Likely demand ischemia in the setting of severe illness. Patient denied chest pain. - 2-D echo done 04/20/14, no evidence of regional wall motion abnormalities. EF 55-60 percent.   Hypertension  - Continue metoprolol. Diuretics discontinued.   Acute encephalopathy - Secondary to severe illness. Resolved.      DVT prophylaxis - Continue Lovenox x 1 month per ortho recommendations.  Medical Consultants:    Susa Day, MD, Orthopedic Surgery  Pulmonary critical care medicine    Discharge Exam:   Filed Vitals:   04/24/14 0800  BP:   Pulse:   Temp:   Resp: 18   Filed Vitals:   04/24/14 0000 04/24/14 0400 04/24/14 0434 04/24/14 0800  BP:   135/66  Pulse:   68   Temp:   98.3 F (36.8 C)   TempSrc:   Oral   Resp: _0 Height:      Weight:   47.6 kg (104 lb 15 oz)   SpO2:   100%     Gen:  NAD Cardiovascular:  RRR, No M/R/G Respiratory: Lungs  CTAB Gastrointestinal: Abdomen soft, NT/ND with normal active bowel sounds. Extremities: No C/E/C   The results of significant diagnostics from this hospitalization (including imaging, microbiology, ancillary and laboratory) are listed below for reference.     Procedures and Diagnostic Studies:   Ct Abdomen Pelvis Wo Contrast 04/17/2014: 1. Findings suspicious for pyelonephritis of the upper left kidney. 2. Comminuted mildly displaced right proximal femur fracture, primarily intertrochanteric with the subtrochanteric component. Associated hemorrhage, some of which tracks in the distal iliopsoas muscle.   Dg Chest 2 View 04/17/2014: COPD. No active disease.   Ct Head Wo Contrast 04/17/2014: Age-related atrophy and chronic small vessel ischemic change. No acute intracranial abnormality.   Dg Hip Unilat With Pelvis 2-3 Views Right 04/17/2014: Comminuted proximal femur fracture involving the intertrochanteric and subtrochanteric femur.  2-D echo 04/20/14: Moderate concentric LV hypertrophy. EF 55-60 percent. No regional wall motion abnormalities. Grade 1 diastolic dysfunction. Mild aortic valve regurgitation. Mild mitral valve regurgitation. Moderate tricuspid valve regurgitation.  Pelvis Portable 04/21/2014: 1. Internal fixation of right intertrochanteric fracture in near anatomic alignment.   ORIF right proximal femur 04/21/14: Done by Dr. Lyla Glassing.  Dg Femur, Min 2 Views Right 04/21/2014: Near-anatomic alignment of fracture fragments following ORIF of the proximal right femur fracture.   Dg Femur, Min 2 Views Right 04/21/2014: Comminuted mildly displaced right proximal femur intertrochanteric fracture. Alignment between the major fracture fragments is improved with more neutral right leg positioning on current is study,, with the right hip mildly abducted on the prior exam. No other change.   Dg Femur [place], [place] 2 Views Right 04/21/2014: Satisfactory appearance of the right femur after open  reduction and internal fixation of proximal fracture.Negative.    Labs:   Basic Metabolic Panel:  Recent Labs Lab 04/18/14 0812  04/20/14 0340  04/21/14 0055  04/21/14 1655 04/22/14 0100 04/22/14 0830 04/23/14 0523 04/24/14 0419  NA 117*  < > 127*  < > 132*  < > 135 130* 134* 132* 134*  K 3.5  < > 3.2*  < > 3.6  < > 3.6 3.9 3.8 3.8 3.7  CL 90*  < > 100  < > 104  < > 107 104 106 106 106  CO2 18*  < > 20  < > 20  < > _1 GLUCOSE 102*  < > 134*  < > 136*  < > 147* 175* 131* 107* 104*  BUN 30*  < > 31*  < > 23  < > _2 CREATININE 1.47*  < > 1.18*  < > 1.21*  < > 1.14* 1.24* 1.34* 1.45* 1.29*  CALCIUM 6.9*  < > 7.5*  < > 7.7*  < > 7.6* 7.3* 7.6* 7.9* 7.7*  MG QUANTITY NOT SUFFICIENT, UNABLE TO PERFORM TEST  --  2.2  --  2.2  --   --  1.8  --  1.7  --   PHOS QUANTITY NOT SUFFICIENT, UNABLE TO PERFORM TEST  --  1.6*  --  1.5*  --   --  2.4  --  2.3  --   < > =  values in this interval not displayed. GFR Estimated Creatinine Clearance: 22.2 mL/min (by C-G formula based on Cr of 1.29). Liver Function Tests:  Recent Labs Lab 04/18/14 0812  AST 167*  ALT 49*  ALKPHOS 46  BILITOT 0.6  PROT 5.2*  ALBUMIN 3.0*   No results for input(s): LIPASE, AMYLASE in the last 168 hours. No results for input(s): AMMONIA in the last 168 hours. Coagulation profile No results for input(s): INR, PROTIME in the last 168 hours.  CBC:  Recent Labs Lab 04/21/14 0055 04/21/14 1655 04/22/14 0100 04/23/14 0523 04/24/14 0419  WBC 13.0* 13.2* 11.3* 14.1* 10.9*  HGB 8.6* 9.4* 7.8* 7.5* 7.5*  HCT 24.6* 27.0* 22.1* 21.8* 21.9*  MCV 82.6 84.4 83.7 84.8 85.9  PLT 165 202 181 230 247   Cardiac Enzymes:  Recent Labs Lab 04/20/14 0340 04/20/14 1012  TROPONINI 0.22* 0.18*   BNP: Invalid input(s): POCBNP CBG:  Recent Labs Lab 04/17/14 1229 04/17/14 1705 04/18/14 0811  GLUCAP 105* 120* 108*   D-Dimer No results for input(s): DDIMER in the last 72 hours. Hgb  A1c No results for input(s): HGBA1C in the last 72 hours. Lipid Profile No results for input(s): CHOL, HDL, LDLCALC, TRIG, CHOLHDL, LDLDIRECT in the last 72 hours. Thyroid function studies No results for input(s): TSH, T4TOTAL, T3FREE, THYROIDAB in the last 72 hours.  Invalid input(s): FREET3 Anemia work up No results for input(s): VITAMINB12, FOLATE, FERRITIN, TIBC, IRON, RETICCTPCT in the last 72 hours. Microbiology Recent Results (from the past 240 hour(s))  Urine culture     Status: None   Collection Time: 04/17/14  1:54 AM  Result Value Ref Range Status   Specimen Description URINE, CATHETERIZED  Final   Special Requests NONE  Final   Colony Count NO GROWTH Performed at [facility]   Final   Culture NO GROWTH Performed at [facility]   Final   Report Status 04/18/2014 FINAL  Final  Culture, blood (routine x 2)     Status: None   Collection Time: 04/17/14  2:37 AM  Result Value Ref Range Status   Specimen Description BLOOD R BRACHIAL  Final   Special Requests BOTTLES DRAWN AEROBIC AND ANAEROBIC 5ML  Final   Culture   Final    NO GROWTH 5 DAYS Performed at [facility]    Report Status 04/23/2014 FINAL  Final  MRSA PCR Screening     Status: None   Collection Time: 04/17/14  9:45 AM  Result Value Ref Range Status   MRSA by PCR NEGATIVE NEGATIVE Final    Comment:        The GeneXpert MRSA Assay (FDA approved for NASAL specimens only), is one component of a comprehensive MRSA colonization surveillance program. It is not intended to diagnose MRSA infection nor to guide or monitor treatment for MRSA infections.   Culture, blood (routine x 2)     Status: None   Collection Time: 04/17/14 10:45 AM  Result Value Ref Range Status   Specimen Description BLOOD RIGHT ARM  Final   Special Requests BOTTLES DRAWN AEROBIC ONLY 5CC AEROBIC ONLY   Final   Culture   Final    NO GROWTH 5 DAYS Performed at [facility]    Report Status  04/23/2014 FINAL  Final  Clostridium Difficile by PCR     Status: None   Collection Time: 04/20/14  1:29 AM  Result Value Ref Range Status   C difficile by pcr NEGATIVE NEGATIVE Final  Discharge Instructions:   Discharge Instructions    Call MD for:  extreme fatigue    Complete by:  As directed      Call MD for:  persistant nausea and vomiting    Complete by:  As directed      Call MD for:  severe uncontrolled pain    Complete by:  As directed      Call MD for:  temperature >100.4    Complete by:  As directed      Diet general    Complete by:  As directed      Increase activity slowly    Complete by:  As directed      Walk with assistance    Complete by:  As directed      Walker     Complete by:  As directed             Medication List    STOP taking these medications        indapamide 2.5 MG tablet  Commonly known as:  LOZOL     omeprazole 20 MG capsule  Commonly known as:  PRILOSEC      TAKE these medications        acetaminophen 325 MG tablet  Commonly known as:  TYLENOL  Take 2 tablets (650 mg total) by mouth every 4 (four) hours as needed for mild pain (temp > 101.5).     alendronate 70 MG tablet  Commonly known as:  FOSAMAX  Take 1 tablet by mouth once a week.     ALPRAZolam 0.5 MG tablet  Commonly known as:  XANAX  Take 0.5-1 tablets (0.25-0.5 mg total) by mouth at bedtime as needed for anxiety.     aspirin 81 MG tablet  Take 81 mg by mouth daily.     benazepril 20 MG tablet  Commonly known as:  LOTENSIN  Take 1 tablet (20 mg total) by mouth daily.     docusate sodium 100 MG capsule  Commonly known as:  COLACE  Take 1 capsule (100 mg total) by mouth 2 (two) times daily.     dorzolamide-timolol 22.3-6.8 MG/ML ophthalmic solution  Commonly known as:  COSOPT  Place 1 drop into both eyes 2 (two) times daily.     enoxaparin 30 MG/0.3ML injection  Commonly known as:  LOVENOX  Inject 0.3 mLs (30 mg total) into the skin daily.     feeding  supplement (ENSURE ENLIVE) Liqd  Take 237 mLs by mouth 2 (two) times daily between meals.     metoprolol tartrate 25 MG tablet  Commonly known as:  LOPRESSOR  Take 0.5 tablets (12.5 mg total) by mouth 2 (two) times daily.     senna 8.6 MG Tabs tablet  Commonly known as:  SENOKOT  Take 1 tablet (8.6 mg total) by mouth 2 (two) times daily.     SIMETHICONE PO  Take 1 tablet by mouth daily as needed.     simvastatin 20 MG tablet  Commonly known as:  ZOCOR  Take 1 tablet by mouth daily.     traMADol 50 MG tablet  Commonly known as:  ULTRAM  Take 1 tablet (50 mg total) by mouth every 6 (six) hours as needed for moderate pain or severe pain.     Vitamin D (Ergocalciferol) 50000 UNITS Caps capsule  Commonly known as:  DRISDOL  Take 1 capsule by mouth every 14 (fourteen) days.           Follow-up  Information    Follow up with Swinteck, Horald Pollen, MD. Schedule an appointment as soon as possible for a visit in 2 weeks.   Specialty:  Orthopedic Surgery   Why:  For wound re-check   Contact information:   [address] [address] [phone]       Follow up with Marjorie Smolder, MD. Schedule an appointment as soon as possible for a visit in 2 weeks.   Specialty:  Family Medicine   Why:  Hospital follow up.   Contact information:   [address] [place] [address] [phone]        Time coordinating discharge: 35 minutes.  Signed:  Rafeal Skibicki  Pager [phone] [facility] 04/24/2014, 9:28 AM

## 2014-04-26 ENCOUNTER — Encounter: Payer: Self-pay | Admitting: Registered Nurse

## 2014-04-26 ENCOUNTER — Non-Acute Institutional Stay (SKILLED_NURSING_FACILITY): Payer: Medicare Other | Admitting: Registered Nurse

## 2014-04-26 DIAGNOSIS — E871 Hypo-osmolality and hyponatremia: Secondary | ICD-10-CM

## 2014-04-26 DIAGNOSIS — I491 Atrial premature depolarization: Secondary | ICD-10-CM | POA: Diagnosis not present

## 2014-04-26 DIAGNOSIS — H409 Unspecified glaucoma: Secondary | ICD-10-CM | POA: Diagnosis not present

## 2014-04-26 DIAGNOSIS — R531 Weakness: Secondary | ICD-10-CM

## 2014-04-26 DIAGNOSIS — S72141D Displaced intertrochanteric fracture of right femur, subsequent encounter for closed fracture with routine healing: Secondary | ICD-10-CM

## 2014-04-26 DIAGNOSIS — R7989 Other specified abnormal findings of blood chemistry: Secondary | ICD-10-CM | POA: Diagnosis not present

## 2014-04-26 DIAGNOSIS — A419 Sepsis, unspecified organism: Secondary | ICD-10-CM

## 2014-04-26 DIAGNOSIS — R778 Other specified abnormalities of plasma proteins: Secondary | ICD-10-CM

## 2014-04-26 DIAGNOSIS — N183 Chronic kidney disease, stage 3 unspecified: Secondary | ICD-10-CM

## 2014-04-26 DIAGNOSIS — I10 Essential (primary) hypertension: Secondary | ICD-10-CM

## 2014-04-26 DIAGNOSIS — D62 Acute posthemorrhagic anemia: Secondary | ICD-10-CM

## 2014-04-26 DIAGNOSIS — S22000A Wedge compression fracture of unspecified thoracic vertebra, initial encounter for closed fracture: Secondary | ICD-10-CM

## 2014-04-26 DIAGNOSIS — M8448XA Pathological fracture, other site, initial encounter for fracture: Secondary | ICD-10-CM

## 2014-04-26 DIAGNOSIS — W19XXXA Unspecified fall, initial encounter: Secondary | ICD-10-CM

## 2014-04-26 DIAGNOSIS — E43 Unspecified severe protein-calorie malnutrition: Secondary | ICD-10-CM | POA: Diagnosis not present

## 2014-04-26 DIAGNOSIS — Y92129 Unspecified place in nursing home as the place of occurrence of the external cause: Secondary | ICD-10-CM

## 2014-04-26 DIAGNOSIS — M810 Age-related osteoporosis without current pathological fracture: Secondary | ICD-10-CM

## 2014-04-26 DIAGNOSIS — E559 Vitamin D deficiency, unspecified: Secondary | ICD-10-CM

## 2014-04-26 NOTE — Progress Notes (Signed)
Patient ID: Kimberly Avery, female   DOB: Feb 14, 1923, 81 y.o.   MRN: [id number]   Place of Service: [facility]  No Known Allergies  Code Status: Full Code  Goals of Care: Longevity/STR  Chief Complaint  Patient presents with  . Hospitalization Follow-up    HPI 81 y.o. female with PMH of HTN, osteoporosis, CKD stage 4, Glaucoma, gout, vit D deficiency among others is being seen for a follow-up visit post hospital admission from 04/17/14 to 04/24/14 sepsis in the setting of presumed pyelonephritis, elevated troponin thought to be from demand  Ischemia in the setting of severe illness, multifactorial anemia, and intertrochanteric fracture of right hip s/p fall at home. She underwent cephalic medullary nail fixation of right femur fracture on 04/22/14. She is here for short term rehab and her goal is to return home. Seen in room today. Daughter at bedside. Per nursing staff, patient had a fall on 04/25/14 w/o any apparent injuries. Xray of lumbar spine/pelvis was ordered by on-call provider due to c/o of back pain. Patient reported hitting her right arm and daughter requested for xray of right arm due to bruising. Patient denies having any pain or discomfort. Denies any other concerns.   Review of Systems Constitutional: Negative for fever and chills HENT: Negative for ear pain and sore throat Eyes: Negative for eye pain and eye discharge. Reported having glaucoma and cataracts  Cardiovascular: Negative for chest pain, palpitations, and leg swelling Respiratory: Negative cough and shortness of breath Gastrointestinal: Negative for nausea and vomiting. Negative for abdominal pain, diarrhea and constipation.  Genitourinary: Negative for  Dysuria Musculoskeletal: Negative for back pain and joint pain Neurological: Negative for dizziness and headache Skin: Negative for rash and wound.   Psychiatric: Negative for depression   Past Medical History  Diagnosis Date  . Hypertension     . Hyperlipidemia   . Gout   . Osteoporosis   . CKD (chronic kidney disease) stage 4, GFR 15-29 ml/min   . Glaucoma   . Vitamin D deficiency     Past Surgical History  Procedure Laterality Date  . Breast surgery Bilateral 1970    mastectomies - fiboradenoma  . Eye surgery  2003, 2004  . Temporal artery biopsy / ligation  2001  . Femur im nail Right 04/21/2014    Procedure: right proximal femur ORIF ;  Surgeon: Samson Frederic, MD;  Location: [facility];  Service: Orthopedics;  Laterality: Right;    History  Substance Use Topics  . Smoking status: Never Smoker   . Smokeless tobacco: Never Used  . Alcohol Use: No    Family History  Problem Relation Age of Onset  . Heart attack Mother     MI while in hospital, RA, HTN  . Stroke Father     MI, CAD, alcoholism  . Cancer Brother     prostate cancer  . Heart Problems Sister     pacemaker, DM, RA, HTN      Medication List       This list is accurate as of: 04/26/14  2:41 PM.  Always use your most recent med list.               acetaminophen 325 MG tablet  Commonly known as:  TYLENOL  Take 2 tablets (650 mg total) by mouth every 4 (four) hours as needed for mild pain (temp > 101.5).     alendronate 70 MG tablet  Commonly known as:  FOSAMAX  Take 1 tablet  by mouth once a week.     ALPRAZolam 0.5 MG tablet  Commonly known as:  XANAX  Take 0.5-1 tablets (0.25-0.5 mg total) by mouth at bedtime as needed for anxiety.     aspirin 81 MG tablet  Take 81 mg by mouth daily.     benazepril 20 MG tablet  Commonly known as:  LOTENSIN  Take 1 tablet (20 mg total) by mouth daily.     docusate sodium 100 MG capsule  Commonly known as:  COLACE  Take 1 capsule (100 mg total) by mouth 2 (two) times daily.     dorzolamide-timolol 22.3-6.8 MG/ML ophthalmic solution  Commonly known as:  COSOPT  Place 1 drop into both eyes 2 (two) times daily.     enoxaparin 30 MG/0.3ML injection  Commonly known as:  LOVENOX  Inject 0.3 mLs (30  mg total) into the skin daily.     feeding supplement (ENSURE ENLIVE) Liqd  Take 237 mLs by mouth 2 (two) times daily between meals.     metoprolol tartrate 25 MG tablet  Commonly known as:  LOPRESSOR  Take 0.5 tablets (12.5 mg total) by mouth 2 (two) times daily.     senna 8.6 MG Tabs tablet  Commonly known as:  SENOKOT  Take 1 tablet (8.6 mg total) by mouth 2 (two) times daily.     SIMETHICONE PO  Take 1 tablet by mouth daily as needed.     simvastatin 20 MG tablet  Commonly known as:  ZOCOR  Take 1 tablet by mouth daily.     traMADol 50 MG tablet  Commonly known as:  ULTRAM  Take 1 tablet (50 mg total) by mouth every 6 (six) hours as needed for moderate pain or severe pain.     Vitamin D (Ergocalciferol) 50000 UNITS Caps capsule  Commonly known as:  DRISDOL  Take 1 capsule by mouth every 14 (fourteen) days.        Physical Exam  BP 108/60 mmHg  Pulse 72  Temp(Src) 98.9 F (37.2 C)  Resp 16  Ht 5\' 6"  (1.676 m)  Wt 104 lb (47.174 kg)  BMI 16.79 kg/m2  SpO2 98%  Constitutional: frail/cachetic elderly female in no acute distress. Conversant with confusions. ?dementia HEENT: Normocephalic and atraumatic. PERRL. EOM intact. No scleral icterus. Oral mucosa moist. Posterior pharynx clear of any exudate or lesions. Neck: Supple and nontender. No lymphadenopathy, masses, or thyromegaly. No JVD or carotid bruits. Cardiac: Slightly tachycardic. Irregularly irregular without appreciable murmurs, rubs, or gallops. Distal pulses intact. 1-2+ pitting edema of BLE.  Lungs: Unlabored respiration. Breath sounds clear bilaterally without rales, rhonchi, or wheezes. Abdomen: Audible bowel sounds in all quadrants. Soft, nontender, nondistended. Musculoskeletal: able to move all extremities. No joint erythema or tenderness. BUE with good range of motion.  Generalized weakness  Skin: Warm and dry. No rash noted. Bruising noted on BUE.   Neurological: Alert and oriented to  self Psychiatric: Appropriate mood and affect.   Labs Reviewed  CBC Latest Ref Rng 04/24/2014 04/23/2014 04/22/2014  WBC 4.0 - 10.5 K/uL 10.9(H) 14.1(H) 11.3(H)  Hemoglobin 12.0 - 15.0 g/dL 7.5(L) 7.5(L) 7.8(L)  Hematocrit 36.0 - 46.0 % 21.9(L) 21.8(L) 22.1(L)  Platelets 150 - 400 K/uL 247 230 181    CMP Latest Ref Rng 04/24/2014 04/23/2014 04/22/2014  Glucose 70 - 99 mg/dL 161(W104(H) 960(A107(H) 540(J131(H)  BUN 6 - 23 mg/dL 21 20 18   Creatinine 0.50 - 1.10 mg/dL 8.11(B1.29(H) 1.47(W1.45(H) 2.95(A1.34(H)  Sodium 135 - 145 mmol/L 134(L)  132(L) 134(L)  Potassium 3.5 - 5.1 mmol/L 3.7 3.8 3.8  Chloride 96 - 112 mmol/L 106 106 106  CO2 19 - 32 mmol/L 22 20 20   Calcium 8.4 - 10.5 mg/dL 7.7(L) 7.9(L) 7.6(L)  Total Protein 6.0 - 8.3 g/dL - - -  Total Bilirubin 0.3 - 1.2 mg/dL - - -  Alkaline Phos 39 - 117 U/L - - -  AST 0 - 37 U/L - - -  ALT 0 - 35 U/L - - -    Lab Results  Component Value Date   TSH 1.095 04/17/2014   Lab Results  Component Value Date   TROPONINI 0.18* 04/20/2014   Diagnostic Studies Reviewed 04/26/14: EKG: NSR with short run of PACs  04/25/14: Lumbar spine/pelvis xray s/p fall:  1. 40% compression fracture T11, undetermined of dating. This is probably old and appears stable 2. Multilevel severe degenerative disc changes 3. Advanced degenerative arthritic changes 4. Scoliosis  04/21/14: Pelvis Xray Internal fixation of right intertrochanteric fracture in near anatomic alignment.  04/20/14: 2D Echo Compared to the prior echo in 09/2013, there have been no significant changes. EF 55-60%, mild AI, MR and moderate TR with RVSP of 42 mmHg.  04/17/14: CXR COPD. No active disease.  04/17/14: CT of abdomen: 1. Findings suspicious for pyelonephritis of the upper left kidney. 2. Comminuted mildly displaced right proximal femur fracture, primarily intertrochanteric with the subtrochanteric component. Associated hemorrhage, some of which tracks in the distal iliopsoas muscle.  Assessment & Plan 1.  Intertrochanteric fracture of right hip, closed, with routine healing, subsequent encounter S/p cephalic medullary nail fixation of right femur. Continue tylenol 650mg  every four hours as needed for mild pain with tramadol 50mg  every six hours as needed for moderate or severe pain. Continue lovenox 30mg  SQ injection daily until 05/22/14 for DVT prophylaxis. Continue to work with PT/OT for gait/strenghth/balance training to restore/maximimze function and f/u with ortho  2. CKD (chronic kidney disease), stage III Baseline creatine is unknown. Current creatinine 1.29. Avoid nephrotoxic agents and continue to monitor renal functions.   3. Protein-calorie malnutrition, severe Continue regular diet with dietary supplement. Continue to monitor weight  4. Elevated troponin Thought to be from demand ischemia in the setting of severe illness. Denies any chest pain. NO evidence of regional wall motion abnormalities with EF 55-60% on 2D Echo on 04/20/14. Monitor clinically  5. Acute blood loss anemia Multifactorial. Most likely in setting of hemorrhage from recent hip fracture, post op, and CKD. Received 1 unit of PRBCs on 04/18/14. Current hbg 7.5. Continue to monitor h&h  6. PACs Slightly tachycardic on exam. HR 106. Hx of PACs. Continue lopressor and monitor  7. Sepsis, due to unspecified organism Secondary to presumed pyelonephritis per CT scan result. Was placed on empiric Vancomycin and Zosyn then switched to Rocephin. Blood and urine cultures remained negative. WBC trending down. Continue to monitor  8. Generalized weakness Continue to work with PT/OT for gait/strength/balance training to restore/maximize function. Fall risk precautions.  9. Fall at nursing home, initial encounter No apparent injuries. Patient enies any pain or discomfort. BUE with bruises. Xray of lumbar spine/pelvis showed 40% compression fracture of T11, undetermined of dating-probably old and appears stable. Explained to family  that xray of Right arm is not clinically necessary as patient does not have any pain or discomfort along with intact ROM. Fall risk precautions.   10. Glaucoma Continue cosopt 22.3/6.8mg /mL 1 gtt in both eyes twice daily  11. Essential hypertension Stable. Continue lopressor and  benazepril. Continue to monitor BP and her status.   12. Hyponatremia Improving. Most recent Na 134. Recheck bmp in 1 week.  13. Compression fracture of body of thoracic vertebra 40% compression fracture T11, undetermined of dating. Appears stable and probably old fracture xray result. Monitor clinically.   14. Osteoporosis Continue fosamax  weekly.  15. Vitamin D deficiency Continue vit D 50,000 iu every 14 days.   Diagnostic Studies/Labs Ordered: cbc w/ diff, bmp in 1 week  Time spent: 50 minutes on care coordination and counseling.   Family/Staff Communication Plan of care discussed with patient, family, and nursing staff. Patient, family, and nursing staff verbalized understanding and agree with plan of care. No additional questions or concerns reported.    Loura Back, MSN, AGNP-C [facility] [address] [place], [place] [address] [phone] [8am-5pm] After hours: [phone]

## 2014-04-28 ENCOUNTER — Non-Acute Institutional Stay: Payer: Federal, State, Local not specified - PPO | Admitting: Registered Nurse

## 2014-04-28 DIAGNOSIS — D62 Acute posthemorrhagic anemia: Secondary | ICD-10-CM | POA: Diagnosis not present

## 2014-04-28 LAB — CBC AND DIFFERENTIAL
HEMATOCRIT: 21 % — AB (ref 36–46)
Hemoglobin: 6.7 g/dL — AB (ref 12.0–16.0)
WBC: 9.1 10^3/mL

## 2014-04-28 NOTE — Progress Notes (Addendum)
Patient ID: Kimberly Avery, female   DOB: 24-May-1923, 81 y.o.   MRN: [id number]   Place of Service: [facility]  No Known Allergies  Code Status: Full Code  Goals of Care: Longevity/STR  Chief Complaint  Patient presents with  . Acute Visit    worsening of anemia    HPI 81 y.o. female with PMH of HTN, osteoporosis, CKD stage 4, Glaucoma, gout, vit D deficiency among others who is here for short term rehabilitation post hospital admission from 04/17/14 to 04/24/14 sepsis in the setting of presumed pyelonephritis, elevated troponin thought to be from demand  Ischemia in the setting of severe illness, multifactorial anemia, and intertrochanteric fracture of right hip s/p cephalic medullary nail fixation on 04/22/14. Seen today for an acute visit for worsening of anemia. Hbg dropped to 6.7 today from 7.5 on 04/24/14. Hemodynamically stable. Denies any concerns.   Review of Systems Constitutional: Negative for fever and chills  Cardiovascular: Negative for chest pain, palpitations Respiratory: Negative cough and shortness of breath Gastrointestinal: Negative for nausea and vomiting. Negative for blood stool Genitourinary: Negative for blood in urine Musculoskeletal: Negative for back pain and joint pain Neurological: Negative for dizziness and headache   Past Medical History  Diagnosis Date  . Hypertension   . Hyperlipidemia   . Gout   . Osteoporosis   . CKD (chronic kidney disease) stage 4, GFR 15-29 ml/min   . Glaucoma   . Vitamin D deficiency     Past Surgical History  Procedure Laterality Date  . Breast surgery Bilateral 1970    mastectomies - fiboradenoma  . Eye surgery  2003, 2004  . Temporal artery biopsy / ligation  2001  . Femur im nail Right 04/21/2014    Procedure: right proximal femur ORIF ;  Surgeon: Samson Frederic, MD;  Location: [facility];  Service: Orthopedics;  Laterality: Right;    History  Substance Use Topics  . Smoking status: Never Smoker     . Smokeless tobacco: Never Used  . Alcohol Use: No    Family History  Problem Relation Age of Onset  . Heart attack Mother     MI while in hospital, RA, HTN  . Stroke Father     MI, CAD, alcoholism  . Cancer Brother     prostate cancer  . Heart Problems Sister     pacemaker, DM, RA, HTN      Medication List       This list is accurate as of: 04/28/14  1:31 PM.  Always use your most recent med list.               acetaminophen 325 MG tablet  Commonly known as:  TYLENOL  Take 2 tablets (650 mg total) by mouth every 4 (four) hours as needed for mild pain (temp > 101.5).     alendronate 70 MG tablet  Commonly known as:  FOSAMAX  Take 1 tablet by mouth once a week.     ALPRAZolam 0.5 MG tablet  Commonly known as:  XANAX  Take 0.5-1 tablets (0.25-0.5 mg total) by mouth at bedtime as needed for anxiety.     aspirin 81 MG tablet  Take 81 mg by mouth daily.     benazepril 20 MG tablet  Commonly known as:  LOTENSIN  Take 1 tablet (20 mg total) by mouth daily.     docusate sodium 100 MG capsule  Commonly known as:  COLACE  Take 1 capsule (100 mg total)  by mouth 2 (two) times daily.     dorzolamide-timolol 22.3-6.8 MG/ML ophthalmic solution  Commonly known as:  COSOPT  Place 1 drop into both eyes 2 (two) times daily.     enoxaparin 30 MG/0.3ML injection  Commonly known as:  LOVENOX  Inject 0.3 mLs (30 mg total) into the skin daily.     feeding supplement (ENSURE ENLIVE) Liqd  Take 237 mLs by mouth 2 (two) times daily between meals.     metoprolol tartrate 25 MG tablet  Commonly known as:  LOPRESSOR  Take 0.5 tablets (12.5 mg total) by mouth 2 (two) times daily.     senna 8.6 MG Tabs tablet  Commonly known as:  SENOKOT  Take 1 tablet (8.6 mg total) by mouth 2 (two) times daily.     SIMETHICONE PO  Take 1 tablet by mouth daily as needed.     simvastatin 20 MG tablet  Commonly known as:  ZOCOR  Take 1 tablet by mouth daily.     traMADol 50 MG tablet   Commonly known as:  ULTRAM  Take 1 tablet (50 mg total) by mouth every 6 (six) hours as needed for moderate pain or severe pain.     Vitamin D (Ergocalciferol) 50000 UNITS Caps capsule  Commonly known as:  DRISDOL  Take 1 capsule by mouth every 14 (fourteen) days.        Physical Exam  BP 137/72 mmHg  Pulse 75  Temp(Src) 96.8 F (36 C)  Resp 16  SpO2 96%  Constitutional: frail/cachetic elderly female in no acute distress. Conversant with confusions. ?dementia HEENT: Normocephalic and atraumatic. PERRL. EOM intact. Bilateral conjunctiva pale. No scleral icterus. Oral mucosa moist. Posterior pharynx clear of  any exudate or lesions. Neck: Supple and nontender. No lymphadenopathy, masses, or thyromegaly. No JVD or carotid bruits. Cardiac: Irregularly irregular (?PACs) without appreciable murmurs, rubs, or gallops. Distal pulses intact. 1+ pitting edema of BLE. BLE TED hose in place Lungs: Unlabored respiration. Breath sounds clear bilaterally without rales, rhonchi, or wheezes. Abdomen: Audible bowel sounds in all quadrants. Soft, nontender, nondistended. Musculoskeletal: able to move all extremities. Generalized weakness  Skin: Warm and dry. No rash noted. Bruising noted on BUE.   Neurological: Alert and oriented to self Psychiatric: Appropriate mood and affect.   Labs Reviewed  CBC Latest Ref Rng 04/28/2014 04/24/2014 04/23/2014  WBC - 9.1 10.9(H) 14.1(H)  Hemoglobin 12.0 - 16.0 g/dL 6.7(A) 7.5(L) 7.5(L)  Hematocrit 36 - 46 % 21(A) 21.9(L) 21.8(L)  Platelets 150 - 400 K/uL - 247 230    CMP Latest Ref Rng 04/24/2014 04/23/2014 04/22/2014  Glucose 70 - 99 mg/dL 409(W104(H) 119(J107(H) 478(G131(H)  BUN 6 - 23 mg/dL 21 20 18   Creatinine 0.50 - 1.10 mg/dL 9.56(O1.29(H) 1.30(Q1.45(H) 6.57(Q1.34(H)  Sodium 135 - 145 mmol/L 134(L) 132(L) 134(L)  Potassium 3.5 - 5.1 mmol/L 3.7 3.8 3.8  Chloride 96 - 112 mmol/L 106 106 106  CO2 19 - 32 mmol/L 22 20 20   Calcium 8.4 - 10.5 mg/dL 7.7(L) 7.9(L) 7.6(L)  Total Protein 6.0 -  8.3 g/dL - - -  Total Bilirubin 0.3 - 1.2 mg/dL - - -  Alkaline Phos 39 - 117 U/L - - -  AST 0 - 37 U/L - - -  ALT 0 - 35 U/L - - -    Lab Results  Component Value Date   TSH 1.095 04/17/2014   Lab Results  Component Value Date   TROPONINI 0.18* 04/20/2014   Diagnostic Studies Reviewed 04/26/14:  EKG: NSR with short run of PACs  04/25/14: Lumbar spine/pelvis xray s/p fall:  1. 40% compression fracture T11, undetermined of dating. This is probably old and appears stable 2. Multilevel severe degenerative disc changes 3. Advanced degenerative arthritic changes 4. Scoliosis  04/21/14: Pelvis Xray Internal fixation of right intertrochanteric fracture in near anatomic alignment.  04/20/14: 2D Echo Compared to the prior echo in 09/2013, there have been no significant changes. EF 55-60%, mild AI, MR and moderate TR with RVSP of 42 mmHg.  04/17/14: CXR COPD. No active disease.  04/17/14: CT of abdomen: 1. Findings suspicious for pyelonephritis of the upper left kidney. 2. Comminuted mildly displaced right proximal femur fracture, primarily intertrochanteric with the subtrochanteric component. Associated hemorrhage, some of which tracks in the distal iliopsoas muscle.  Assessment & Plan 1. Acute blood loss anemia Multifactorial. Most likely in setting of hemorrhage from recent hip fracture, post op, and CKD. Received 1 unit of PRBCs on 04/18/14. Hgb dropped from 7.5 on 04/24/14 to 6.7 today. Hemodynamically stable/asypmtomatic. Hemoccult negative. Will send her to same day outpatient surgery for type/cross and transfusion of 2 units of PRBCs. Continue to monitor her status.   Family/Staff Communication Plan of care discussed with patient and nursing supervisor. Patient and nursing supervisor verbalized understanding and agree with plan of care. No additional questions or concerns reported.    Loura Back, MSN, AGNP-C [facility] [address] [place], [place] [address] [phone]  [8am-5pm] After hours: [phone]

## 2014-04-29 ENCOUNTER — Non-Acute Institutional Stay (SKILLED_NURSING_FACILITY): Payer: Medicare Other | Admitting: Internal Medicine

## 2014-04-29 ENCOUNTER — Encounter (HOSPITAL_COMMUNITY)
Admission: RE | Admit: 2014-04-29 | Discharge: 2014-04-29 | Disposition: A | Discharge status: Home / Self Care | Payer: Federal, State, Local not specified - PPO | Source: Ambulatory Visit | Attending: Internal Medicine | Admitting: Internal Medicine

## 2014-04-29 DIAGNOSIS — N183 Chronic kidney disease, stage 3 (moderate): Secondary | ICD-10-CM | POA: Insufficient documentation

## 2014-04-29 DIAGNOSIS — E43 Unspecified severe protein-calorie malnutrition: Secondary | ICD-10-CM

## 2014-04-29 DIAGNOSIS — S7221XS Displaced subtrochanteric fracture of right femur, sequela: Secondary | ICD-10-CM

## 2014-04-29 DIAGNOSIS — M810 Age-related osteoporosis without current pathological fracture: Secondary | ICD-10-CM

## 2014-04-29 DIAGNOSIS — R5381 Other malaise: Secondary | ICD-10-CM

## 2014-04-29 DIAGNOSIS — D62 Acute posthemorrhagic anemia: Secondary | ICD-10-CM

## 2014-04-29 DIAGNOSIS — I129 Hypertensive chronic kidney disease with stage 1 through stage 4 chronic kidney disease, or unspecified chronic kidney disease: Secondary | ICD-10-CM | POA: Diagnosis not present

## 2014-04-29 DIAGNOSIS — N10 Acute tubulo-interstitial nephritis: Secondary | ICD-10-CM

## 2014-04-29 LAB — ABO/RH: ABO/RH(D): O POS

## 2014-04-29 LAB — PREPARE RBC (CROSSMATCH)

## 2014-04-29 NOTE — Progress Notes (Signed)
Patient ID: Kimberly Avery, female   DOB: Oct 26, 1923, 81 y.o.   MRN: [id number]     Facility: [facility]    PCP: Hollice Espy, MD  Code Status: full code  No Known Allergies  Chief Complaint  Patient presents with  . New Admit To SNF     HPI:  81 year old patient is here for short term rehabilitation post hospital admission from 04/17/14-04/24/14 with sepsis secondary to pyelonephritis, intertrochanteric fracture of right hip, acute blood loss anemia, demand ischemia. Her blood and urine culture were negative. She completed a week of rocephin in the hospital. She underwent medullary nail fixation of right femur fracture on 04/22/14. She received 1 u prbc in the hospital.  She is seen in her room today. She is in no distress. She had a good night sleep. She had low hb/hct on lab draw with hb 6.7 and is due for blood transfusion today at transfusion center. Denies any dizziness, palpitation or chest pain.  She has PMH of CKD stage 3, protein calorie malnutrition, HTN, anemia of chronic disease among others.  Review of Systems:  Constitutional: Negative for fever, chills, diaphoresis. Positive for malaise/ fatigue HENT: Negative for headache, congestion, nasal discharge Eyes: Negative for eye pain, blurred vision, double vision and discharge.  Respiratory: Negative for cough, shortness of breath and wheezing.   Cardiovascular: Negative for chest pain, palpitations. Positive for leg swelling.  Gastrointestinal: Negative for heartburn, nausea, vomiting, abdominal pain. Had bowel movement yesterday, denies melena or rectal bleed Genitourinary: Negative for dysuria, hematuria or flank pain Musculoskeletal: Negative for back pain, falls. Had slipped out of her wheelchair few days back in the facility and bruised her right forearm. Skin: Negative for itching, rash.  Neurological: Negative for dizziness, tingling, focal weakness Psychiatric/Behavioral:  Negative for depression   Past Medical History  Diagnosis Date  . Hypertension   . Hyperlipidemia   . Gout   . Osteoporosis   . CKD (chronic kidney disease) stage 4, GFR 15-29 ml/min   . Glaucoma   . Vitamin D deficiency    Past Surgical History  Procedure Laterality Date  . Breast surgery Bilateral 1970    mastectomies - fiboradenoma  . Eye surgery  2003, 2004  . Temporal artery biopsy / ligation  2001  . Femur im nail Right 04/21/2014    Procedure: right proximal femur ORIF ;  Surgeon: Samson Frederic, MD;  Location: [facility];  Service: Orthopedics;  Laterality: Right;   Social History:   reports that she has never smoked. She has never used smokeless tobacco. She reports that she does not drink alcohol or use illicit drugs.  Family History  Problem Relation Age of Onset  . Heart attack Mother     MI while in hospital, RA, HTN  . Stroke Father     MI, CAD, alcoholism  . Cancer Brother     prostate cancer  . Heart Problems Sister     pacemaker, DM, RA, HTN    Medications: Patient's Medications  New Prescriptions   No medications on file  Previous Medications   ACETAMINOPHEN (TYLENOL) 325 MG TABLET    Take 2 tablets (650 mg total) by mouth every 4 (four) hours as needed for mild pain (temp > 101.5).   ALENDRONATE (FOSAMAX) 70 MG TABLET    Take 1 tablet by mouth once a week.   ALPRAZOLAM (XANAX) 0.5 MG TABLET    Take 0.5-1 tablets (0.25-0.5 mg total) by mouth at  bedtime as needed for anxiety.   ASPIRIN 81 MG TABLET    Take 81 mg by mouth daily.   BENAZEPRIL (LOTENSIN) 20 MG TABLET    Take 1 tablet (20 mg total) by mouth daily.   DOCUSATE SODIUM (COLACE) 100 MG CAPSULE    Take 1 capsule (100 mg total) by mouth 2 (two) times daily.   DORZOLAMIDE-TIMOLOL (COSOPT) 22.3-6.8 MG/ML OPHTHALMIC SOLUTION    Place 1 drop into both eyes 2 (two) times daily.   ENOXAPARIN (LOVENOX) 30 MG/0.3ML INJECTION    Inject 0.3 mLs (30 mg total) into the skin daily.   FEEDING SUPPLEMENT, ENSURE  ENLIVE, (ENSURE ENLIVE) LIQD    Take 237 mLs by mouth 2 (two) times daily between meals.   METOPROLOL TARTRATE (LOPRESSOR) 25 MG TABLET    Take 0.5 tablets (12.5 mg total) by mouth 2 (two) times daily.   SENNA (SENOKOT) 8.6 MG TABS TABLET    Take 1 tablet (8.6 mg total) by mouth 2 (two) times daily.   SIMETHICONE PO    Take 1 tablet by mouth daily as needed.    SIMVASTATIN (ZOCOR) 20 MG TABLET    Take 1 tablet by mouth daily.   TRAMADOL (ULTRAM) 50 MG TABLET    Take 1 tablet (50 mg total) by mouth every 6 (six) hours as needed for moderate pain or severe pain.   VITAMIN D, ERGOCALCIFEROL, (DRISDOL) 50000 UNITS CAPS CAPSULE    Take 1 capsule by mouth every 14 (fourteen) days.  Modified Medications   No medications on file  Discontinued Medications   No medications on file     Physical Exam: Filed Vitals:   04/29/14 0942  BP: 135/63  Pulse: 75  Temp: 98.1 F (36.7 C)  Resp: 18  SpO2: 94%    General- elderly female, thin built with muscle wasting, in no acute distress Head- normocephalic, atraumatic Nose- no maxillary or frontal sinus tenderness, no nasal discharge Throat- moist mucus membrane Eyes- PERRLA, EOMI, no pallor, no icterus, no discharge, normal conjunctiva, normal sclera Neck- no cervical lymphadenopathy Cardiovascular- normal s1,s2, no murmurs, palpable dorsalis pedis and radial pulses, 1+ leg edema Respiratory- bilateral clear to auscultation, no wheeze, no rhonchi, no crackles, no use of accessory muscles Abdomen- bowel sounds present, soft, non tender Musculoskeletal- able to move all 4 extremities, limited ROM with right leg  Neurological- no focal deficit Skin- warm and dry, aquacel dressing on right hip incision, clean and dry dressing Psychiatry- alert and oriented, normal mood and affect    Labs reviewed: Basic Metabolic Panel:  Recent Labs  40/98/11 0055  04/22/14 0100 04/22/14 0830 04/23/14 0523 04/24/14 0419  NA 132*  < > 130* 134* 132* 134*  K  3.6  < > 3.9 3.8 3.8 3.7  CL 104  < > 104 106 106 106  CO2 20  < > GLUCOSE 136*  < > 175* 131* 107* 104*  BUN 23  < > CREATININE 1.21*  < > 1.24* 1.34* 1.45* 1.29*  CALCIUM 7.7*  < > 7.3* 7.6* 7.9* 7.7*  MG 2.2  --  1.8  --  1.7  --   PHOS 1.5*  --  2.4  --  2.3  --   < > = values in this interval not displayed. Liver Function Tests:  Recent Labs  04/17/14 0146 04/18/14 0812  AST 125* 167*  ALT 38* 49*  ALKPHOS 63 46  BILITOT 1.0 0.6  PROT 6.6 5.2*  ALBUMIN 3.7 3.0*    Recent Labs  04/17/14 0146  LIPASE 42   No results for input(s): AMMONIA in the last 8760 hours. CBC:  Recent Labs  04/17/14 0146  04/22/14 0100 04/23/14 0523 04/24/14 0419 04/28/14  WBC 8.5  < > 11.3* 14.1* 10.9* 9.1  NEUTROABS 7.2  --   --   --   --   --   HGB 11.2*  < > 7.8* 7.5* 7.5* 6.7*  HCT 31.3*  < > 22.1* 21.8* 21.9* 21*  MCV 78.3  < > 83.7 84.8 85.9  --   PLT 143*  < > 181 230 247  --   < > = values in this interval not displayed. Cardiac Enzymes:  Recent Labs  04/20/14 0340 04/20/14 1012  TROPONINI 0.22* 0.18*   BNP: Invalid input(s): POCBNP CBG:  Recent Labs  04/17/14 1229 04/17/14 1705 04/18/14 0811  GLUCAP 105* 120* 108*    Assessment/Plan  Physical deconditioning Will have her work with physical therapy and occupational therapy team to help with gait training and muscle strengthening exercises.fall precautions. Skin care. Encourage to be out of bed.   Intertrochanteric fracture of right hip S/p cephalic medullary nail fixation of right femur. Has f/u with orthopedics. Continue to work with therapy team. Continue tylenol 650mg  q4h prn for mild pain and tramadol 50mg  q6h prn for moderate or severe pain. Continue lovenox until 05/22/14 for DVT prophylaxis. D/c aspirin with pt on lovenox and low hb. To wear her ted hose.  Acute blood loss anemia Likely post op from blood loss. Hb 7.5 on discharge from hospital post 1 u transfusion. Baseline hb  around 11.2. Hb in facility 6.7, to get 2 u prbc transfusion today. Monitor h&h post transfusion. Hold off on her aspirin for now  Protein calorie malnutrition Monitor po intake and weight, continue dietary supplement and pressure ulcer prophylaxis  Pyelonephritis Completed antibiotic course, remains afebrile, no symptoms currently, monitor clinically  CKD stage III Monitor renal function  Essential hypertension Stable. Continue lopressor and benazepril.   Osteoporosis Continue fosamax 70 mg weekly and vitamin d 50,000 iu biweekly. Low calcium level on review of labs. Add oscal-d 600-400 bid for now  Goals of care: short term rehabilitation   Labs/tests ordered: cbc post transfusion  Family/ staff Communication: reviewed care plan with patient and nursing supervisor    Oneal GroutMAHIMA Suheyla Mortellaro, MD  [facility] [phone] (Monday-Friday 8 am - 5 pm) [phone] (afterhours)

## 2014-04-30 ENCOUNTER — Ambulatory Visit (HOSPITAL_COMMUNITY)
Admission: RE | Admit: 2014-04-30 | Discharge: 2014-04-30 | Disposition: A | Discharge status: Home / Self Care | Payer: Medicare Other | Source: Ambulatory Visit | Attending: Internal Medicine | Admitting: Internal Medicine

## 2014-04-30 DIAGNOSIS — D62 Acute posthemorrhagic anemia: Secondary | ICD-10-CM | POA: Insufficient documentation

## 2014-04-30 MED ORDER — SODIUM CHLORIDE 0.9 % IV SOLN: Freq: Once | INTRAVENOUS | Status: DC

## 2014-05-01 LAB — TYPE AND SCREEN
ABO/RH(D): O POS
Antibody Screen: NEGATIVE
Unit division: 0
Unit division: 0

## 2014-05-03 LAB — BASIC METABOLIC PANEL
BUN: 24 mg/dL — AB (ref 4–21)
Creatinine: 1.2 mg/dL — AB (ref 0.5–1.1)
Glucose: 83 mg/dL
Potassium: 4 mmol/L (ref 3.4–5.3)
SODIUM: 140 mmol/L (ref 137–147)

## 2014-05-03 LAB — CBC AND DIFFERENTIAL
HEMATOCRIT: 31 % — AB (ref 36–46)
HEMOGLOBIN: 10.1 g/dL — AB (ref 12.0–16.0)
WBC: 5.3 10*3/mL

## 2014-05-05 ENCOUNTER — Ambulatory Visit: Payer: Self-pay | Admitting: Internal Medicine

## 2014-05-12 ENCOUNTER — Encounter: Payer: Self-pay | Admitting: Registered Nurse

## 2014-05-12 ENCOUNTER — Non-Acute Institutional Stay (SKILLED_NURSING_FACILITY): Payer: Medicare Other | Admitting: Registered Nurse

## 2014-05-12 DIAGNOSIS — N183 Chronic kidney disease, stage 3 unspecified: Secondary | ICD-10-CM

## 2014-05-12 DIAGNOSIS — H409 Unspecified glaucoma: Secondary | ICD-10-CM

## 2014-05-12 DIAGNOSIS — E559 Vitamin D deficiency, unspecified: Secondary | ICD-10-CM

## 2014-05-12 DIAGNOSIS — S22000A Wedge compression fracture of unspecified thoracic vertebra, initial encounter for closed fracture: Secondary | ICD-10-CM

## 2014-05-12 DIAGNOSIS — M8448XA Pathological fracture, other site, initial encounter for fracture: Secondary | ICD-10-CM | POA: Diagnosis not present

## 2014-05-12 DIAGNOSIS — I10 Essential (primary) hypertension: Secondary | ICD-10-CM | POA: Diagnosis not present

## 2014-05-12 DIAGNOSIS — S7221XS Displaced subtrochanteric fracture of right femur, sequela: Secondary | ICD-10-CM | POA: Diagnosis not present

## 2014-05-12 DIAGNOSIS — E43 Unspecified severe protein-calorie malnutrition: Secondary | ICD-10-CM

## 2014-05-12 DIAGNOSIS — I491 Atrial premature depolarization: Secondary | ICD-10-CM | POA: Diagnosis not present

## 2014-05-12 DIAGNOSIS — R5381 Other malaise: Secondary | ICD-10-CM | POA: Diagnosis not present

## 2014-05-12 DIAGNOSIS — D62 Acute posthemorrhagic anemia: Secondary | ICD-10-CM

## 2014-05-12 DIAGNOSIS — M810 Age-related osteoporosis without current pathological fracture: Secondary | ICD-10-CM | POA: Diagnosis not present

## 2014-05-12 NOTE — Progress Notes (Signed)
Patient ID: Kimberly Avery, female   DOB: 06-Mar-1923, 81 y.o.   MRN: [id number]   Place of Service: [facility]  No Known Allergies  Code Status: Full Code  Goals of Care: Longevity/STR  Chief Complaint  Patient presents with  . Discharge Note    HPI 81 y.o. female with PMH of HTN, osteoporosis, CKD stage 4, Glaucoma, gout, vit D deficiency among others is being seen for discharge visit. She was here for short term rehab post hospital admission from 04/17/14 to 04/24/14 with sepsis in the setting of presumed pyelonephritis, elevated troponin thought to be from demand  Ischemia in the setting of severe illness, multifactorial anemia, and intertrochanteric fracture of right hip s/p fall at home. She underwent cephalic medullary nail fixation of right femur fracture on 04/22/14. She has worked well with therapy team and is ready to be discharged home with [facility] PT/OT/Aide and DME (wheelchair with elevating leg rest, 3-1). Seen in room today. Denies any concerns. Reported feeling excited about going home. Pain is adequately controlled with current regimen. Has no problem with constipation.  Review of Systems Constitutional: Negative for fever and chills HENT: Negative for ear pain and sore throat Eyes: Negative for eye pain and eye discharge.  Cardiovascular: Negative for chest pain, palpitations, and leg swelling Respiratory: Negative cough and shortness of breath Gastrointestinal: Negative for nausea and vomiting. Negative for abdominal pain, diarrhea and constipation.  Genitourinary: Negative for  Dysuria Musculoskeletal: Negative for back pain and joint pain Neurological: Negative for dizziness and headache Skin: Negative for rash and wound.   Psychiatric: Negative for depression   Past Medical History  Diagnosis Date  . Hypertension   . Hyperlipidemia   . Gout   . Osteoporosis   . CKD (chronic kidney disease) stage 4, GFR 15-29 ml/min   . Glaucoma   . Vitamin D  deficiency     Past Surgical History  Procedure Laterality Date  . Breast surgery Bilateral 1970    mastectomies - fiboradenoma  . Eye surgery  2003, 2004  . Temporal artery biopsy / ligation  2001  . Femur im nail Right 04/21/2014    Procedure: right proximal femur ORIF ;  Surgeon: Samson Frederic, MD;  Location: [facility];  Service: Orthopedics;  Laterality: Right;    History  Substance Use Topics  . Smoking status: Never Smoker   . Smokeless tobacco: Never Used  . Alcohol Use: No    Family History  Problem Relation Age of Onset  . Heart attack Mother     MI while in hospital, RA, HTN  . Stroke Father     MI, CAD, alcoholism  . Cancer Brother     prostate cancer  . Heart Problems Sister     pacemaker, DM, RA, HTN      Medication List       This list is accurate as of: 05/12/14  9:21 PM.  Always use your most recent med list.               acetaminophen 325 MG tablet  Commonly known as:  TYLENOL  Take 2 tablets (650 mg total) by mouth every 4 (four) hours as needed for mild pain (temp > 101.5).     alendronate 70 MG tablet  Commonly known as:  FOSAMAX  Take 1 tablet by mouth once a week.     ALPRAZolam 0.5 MG tablet  Commonly known as:  XANAX  Take 0.5-1 tablets (0.25-0.5 mg total) by  mouth at bedtime as needed for anxiety.     aspirin 81 MG tablet  Take 81 mg by mouth daily.     benazepril 20 MG tablet  Commonly known as:  LOTENSIN  Take 1 tablet (20 mg total) by mouth daily.     docusate sodium 100 MG capsule  Commonly known as:  COLACE  Take 1 capsule (100 mg total) by mouth 2 (two) times daily.     dorzolamide-timolol 22.3-6.8 MG/ML ophthalmic solution  Commonly known as:  COSOPT  Place 1 drop into both eyes 2 (two) times daily.     enoxaparin 30 MG/0.3ML injection  Commonly known as:  LOVENOX  Inject 0.3 mLs (30 mg total) into the skin daily.     feeding supplement (ENSURE ENLIVE) Liqd  Take 237 mLs by mouth 2 (two) times daily between meals.       metoprolol tartrate 12.5 mg Tabs tablet  Commonly known as:  LOPRESSOR  Take 12.5 mg by mouth 2 (two) times daily.     senna 8.6 MG Tabs tablet  Commonly known as:  SENOKOT  Take 1 tablet (8.6 mg total) by mouth 2 (two) times daily.     SIMETHICONE PO  Take 1 tablet by mouth daily as needed.     simvastatin 20 MG tablet  Commonly known as:  ZOCOR  Take 1 tablet by mouth daily.     traMADol 50 MG tablet  Commonly known as:  ULTRAM  Take 1 tablet (50 mg total) by mouth every 6 (six) hours as needed for moderate pain or severe pain.     Vitamin D (Ergocalciferol) 50000 UNITS Caps capsule  Commonly known as:  DRISDOL  Take 1 capsule by mouth every 14 (fourteen) days.        Physical Exam  BP 132/74 mmHg  Pulse 62  Temp(Src) 97.1 F (36.2 C)  Resp 16  Ht 5\' 8"  (1.727 m)  Wt 110 lb (49.896 kg)  BMI 16.73 kg/m2  Constitutional: frail/cachetic elderly female in no acute distress. Conversant with some confusions.  HEENT: Normocephalic and atraumatic. PERRL. EOM intact. No scleral icterus. Oral mucosa moist. Posterior pharynx clear of any exudate or lesions. Neck: Supple and nontender. No lymphadenopathy, masses, or thyromegaly. No JVD or carotid bruits. Cardiac: Irregularly irregular without appreciable murmurs, rubs, or gallops. Distal pulses intact. 1+ pitting edema of BLE. Respiratory: Unlabored respiration. Breath sounds clear bilaterally without rales, rhonchi, or wheezes. GI: Audible bowel sounds in all quadrants. Soft, nontender, nondistended. Musculoskeletal: able to move all extremities. No joint erythema or tenderness. Generalized weakness  Skin: Warm and dry.  Neurological: Alert and oriented to self Psychiatric: Appropriate mood and affect.   Labs Reviewed  CBC Latest Ref Rng 05/03/2014 04/28/2014 04/24/2014  WBC - 5.3 9.1 10.9(H)  Hemoglobin 12.0 - 16.0 g/dL 10.1(A) 6.7(A) 7.5(L)  Hematocrit 36 - 46 % 31(A) 21(A) 21.9(L)  Platelets 150 - 400 K/uL - - 247     CMP Latest Ref Rng 05/03/2014 04/24/2014 04/23/2014  Glucose 70 - 99 mg/dL - 161(W104(H) 960(A107(H)  BUN 4 - 21 mg/dL 54(U24(A) 21 20  Creatinine 0.5 - 1.1 mg/dL 1.2(A) 1.29(H) 1.45(H)  Sodium 137 - 147 mmol/L 140 134(L) 132(L)  Potassium 3.4 - 5.3 mmol/L 4.0 3.7 3.8  Chloride 96 - 112 mmol/L - 106 106  CO2 19 - 32 mmol/L - 22 20  Calcium 8.4 - 10.5 mg/dL - 7.7(L) 7.9(L)  Total Protein 6.0 - 8.3 g/dL - - -  Total Bilirubin  0.3 - 1.2 mg/dL - - -  Alkaline Phos 39 - 117 U/L - - -  AST 0 - 37 U/L - - -  ALT 0 - 35 U/L - - -    Lab Results  Component Value Date   TSH 1.095 04/17/2014   Lab Results  Component Value Date   TROPONINI 0.18* 04/20/2014   Diagnostic Studies Reviewed 04/26/14: EKG: NSR with short run of PACs  04/25/14: Lumbar spine/pelvis xray s/p fall:  1. 40% compression fracture T11, undetermined of dating. This is probably old and appears stable 2. Multilevel severe degenerative disc changes 3. Advanced degenerative arthritic changes 4. Scoliosis  04/21/14: Pelvis Xray Internal fixation of right intertrochanteric fracture in near anatomic alignment.  04/20/14: 2D Echo Compared to the prior echo in 09/2013, there have been no significant changes. EF 55-60%, mild AI, MR and moderate TR with RVSP of 42 mmHg.  04/17/14: CXR COPD. No active disease.  04/17/14: CT of abdomen: 1. Findings suspicious for pyelonephritis of the upper left kidney. 2. Comminuted mildly displaced right proximal femur fracture, primarily intertrochanteric with the subtrochanteric component. Associated hemorrhage, some of which tracks in the distal iliopsoas muscle.  Assessment & Plan 1. Subtrochanteric fracture of right hip, sequela  S/p cephalic medullary nail fixation of right femur. Pain is adequately controlled with current regimen. Continue tylenol  every four hours as needed for mild pain with tramadol  every six hours as needed for moderate or severe pain. Continue lovenox  SQ injection  daily until 05/24/14 for DVT prophylaxis. Continue to work with [facility] PT/OT for gait/strenghth/balance training to restore/maximimze function. Continue to f/u with ortho  2. CKD (chronic kidney disease), stage III Baseline creatine is unknown. Current creatinine 1.2. Avoid nephrotoxic agents. PCP to monitor renal functions.   3. Protein-calorie malnutrition, severe Continue regular diet with dietary supplement.   4. Acute blood loss anemia Multifactorial. Most likely in setting of hemorrhage from recent hip fracture, post op, and CKD. Received 1 unit of PRBCs on 04/18/14 and 2 units on 04/30/14. Current hbg 10.1-PCP to monitor h&h  5. PACs Hx of PACs. Continue lopressor 12.5mg  twice daily.   6. Glaucoma Stable. Continue cosopt 22.3/6.8mg /mL 1 gtt in both eyes twice daily  7. Essential hypertension Stable. Continue lopressor 12.5mg  twice daily  and benazepril  daily.   8. Compression fracture of body of thoracic vertebra 40% compression fracture T11, undetermined of dating. Appears stable and probably old fracture xray result. Continue to f/u with PCP and ortho  9. Osteoporosis Continue fosamax  weekly.  10. Vitamin D deficiency Continue vit D 50,000 iu every 14 days.   11. Physical deconditioning Continue to work with [facility] PT/OT for gait/strength/balance training to restore/maximize function. Fall risk precautions  Home health services: PT/OT/Aide DME required: w/c with elevating leg rest, 3-1 PCP follow-up: Dr. Kevan Ny on 06/03/14 :45am 30-day supply of prescription medications provided (#30 tramadol )  Family/Staff Communication Plan of care discussed with patient, family, and nursing staff. Patient, family, and nursing staff verbalized understanding and agree with plan of care. No additional questions or concerns reported.    Loura Back, MSN, AGNP-C [facility] [address] [place], [place] [address] [phone] [8am-5pm] After hours: [phone]

## 2014-05-18 ENCOUNTER — Emergency Department (HOSPITAL_COMMUNITY): Payer: Medicare Other

## 2014-05-18 ENCOUNTER — Encounter (HOSPITAL_COMMUNITY): Payer: Self-pay

## 2014-05-18 ENCOUNTER — Inpatient Hospital Stay (HOSPITAL_COMMUNITY): Payer: Medicare Other

## 2014-05-18 ENCOUNTER — Inpatient Hospital Stay (HOSPITAL_COMMUNITY)
Admission: EM | Admit: 2014-05-18 | Discharge: 2014-05-24 | DRG: 480 | Disposition: A | Discharge status: Skilled Nursing Facility | Payer: Medicare Other | Attending: Internal Medicine | Admitting: Internal Medicine

## 2014-05-18 DIAGNOSIS — I5032 Chronic diastolic (congestive) heart failure: Secondary | ICD-10-CM | POA: Diagnosis present

## 2014-05-18 DIAGNOSIS — Z8249 Family history of ischemic heart disease and other diseases of the circulatory system: Secondary | ICD-10-CM

## 2014-05-18 DIAGNOSIS — Y92009 Unspecified place in unspecified non-institutional (private) residence as the place of occurrence of the external cause: Secondary | ICD-10-CM | POA: Diagnosis not present

## 2014-05-18 DIAGNOSIS — E559 Vitamin D deficiency, unspecified: Secondary | ICD-10-CM | POA: Diagnosis present

## 2014-05-18 DIAGNOSIS — N183 Chronic kidney disease, stage 3 unspecified: Secondary | ICD-10-CM | POA: Diagnosis present

## 2014-05-18 DIAGNOSIS — N179 Acute kidney failure, unspecified: Secondary | ICD-10-CM | POA: Diagnosis not present

## 2014-05-18 DIAGNOSIS — D631 Anemia in chronic kidney disease: Secondary | ICD-10-CM | POA: Diagnosis present

## 2014-05-18 DIAGNOSIS — Z809 Family history of malignant neoplasm, unspecified: Secondary | ICD-10-CM | POA: Diagnosis not present

## 2014-05-18 DIAGNOSIS — J449 Chronic obstructive pulmonary disease, unspecified: Secondary | ICD-10-CM | POA: Diagnosis present

## 2014-05-18 DIAGNOSIS — R627 Adult failure to thrive: Secondary | ICD-10-CM | POA: Diagnosis present

## 2014-05-18 DIAGNOSIS — S42212A Unspecified displaced fracture of surgical neck of left humerus, initial encounter for closed fracture: Secondary | ICD-10-CM | POA: Diagnosis present

## 2014-05-18 DIAGNOSIS — A419 Sepsis, unspecified organism: Secondary | ICD-10-CM

## 2014-05-18 DIAGNOSIS — W010XXA Fall on same level from slipping, tripping and stumbling without subsequent striking against object, initial encounter: Secondary | ICD-10-CM | POA: Diagnosis present

## 2014-05-18 DIAGNOSIS — D62 Acute posthemorrhagic anemia: Secondary | ICD-10-CM | POA: Diagnosis not present

## 2014-05-18 DIAGNOSIS — Y95 Nosocomial condition: Secondary | ICD-10-CM | POA: Diagnosis present

## 2014-05-18 DIAGNOSIS — E43 Unspecified severe protein-calorie malnutrition: Secondary | ICD-10-CM | POA: Diagnosis present

## 2014-05-18 DIAGNOSIS — J9620 Acute and chronic respiratory failure, unspecified whether with hypoxia or hypercapnia: Secondary | ICD-10-CM | POA: Diagnosis not present

## 2014-05-18 DIAGNOSIS — N184 Chronic kidney disease, stage 4 (severe): Secondary | ICD-10-CM | POA: Diagnosis present

## 2014-05-18 DIAGNOSIS — S72142A Displaced intertrochanteric fracture of left femur, initial encounter for closed fracture: Principal | ICD-10-CM

## 2014-05-18 DIAGNOSIS — E876 Hypokalemia: Secondary | ICD-10-CM | POA: Clinically undetermined

## 2014-05-18 DIAGNOSIS — M4802 Spinal stenosis, cervical region: Secondary | ICD-10-CM | POA: Diagnosis present

## 2014-05-18 DIAGNOSIS — I951 Orthostatic hypotension: Secondary | ICD-10-CM | POA: Diagnosis present

## 2014-05-18 DIAGNOSIS — Z79899 Other long term (current) drug therapy: Secondary | ICD-10-CM

## 2014-05-18 DIAGNOSIS — I10 Essential (primary) hypertension: Secondary | ICD-10-CM | POA: Diagnosis present

## 2014-05-18 DIAGNOSIS — S42302A Unspecified fracture of shaft of humerus, left arm, initial encounter for closed fracture: Secondary | ICD-10-CM | POA: Diagnosis not present

## 2014-05-18 DIAGNOSIS — Z96649 Presence of unspecified artificial hip joint: Secondary | ICD-10-CM | POA: Diagnosis present

## 2014-05-18 DIAGNOSIS — E785 Hyperlipidemia, unspecified: Secondary | ICD-10-CM | POA: Diagnosis present

## 2014-05-18 DIAGNOSIS — I129 Hypertensive chronic kidney disease with stage 1 through stage 4 chronic kidney disease, or unspecified chronic kidney disease: Secondary | ICD-10-CM | POA: Diagnosis present

## 2014-05-18 DIAGNOSIS — Z419 Encounter for procedure for purposes other than remedying health state, unspecified: Secondary | ICD-10-CM

## 2014-05-18 DIAGNOSIS — Z681 Body mass index (BMI) 19 or less, adult: Secondary | ICD-10-CM

## 2014-05-18 DIAGNOSIS — I499 Cardiac arrhythmia, unspecified: Secondary | ICD-10-CM | POA: Diagnosis present

## 2014-05-18 DIAGNOSIS — H409 Unspecified glaucoma: Secondary | ICD-10-CM | POA: Diagnosis present

## 2014-05-18 DIAGNOSIS — D638 Anemia in other chronic diseases classified elsewhere: Secondary | ICD-10-CM | POA: Diagnosis not present

## 2014-05-18 DIAGNOSIS — R000 Tachycardia, unspecified: Secondary | ICD-10-CM | POA: Clinically undetermined

## 2014-05-18 DIAGNOSIS — R6510 Systemic inflammatory response syndrome (SIRS) of non-infectious origin without acute organ dysfunction: Secondary | ICD-10-CM | POA: Clinically undetermined

## 2014-05-18 DIAGNOSIS — S42202A Unspecified fracture of upper end of left humerus, initial encounter for closed fracture: Secondary | ICD-10-CM | POA: Diagnosis present

## 2014-05-18 DIAGNOSIS — M79605 Pain in left leg: Secondary | ICD-10-CM | POA: Diagnosis present

## 2014-05-18 DIAGNOSIS — E871 Hypo-osmolality and hyponatremia: Secondary | ICD-10-CM | POA: Diagnosis present

## 2014-05-18 DIAGNOSIS — W19XXXA Unspecified fall, initial encounter: Secondary | ICD-10-CM

## 2014-05-18 DIAGNOSIS — I471 Supraventricular tachycardia: Secondary | ICD-10-CM | POA: Diagnosis present

## 2014-05-18 DIAGNOSIS — Z833 Family history of diabetes mellitus: Secondary | ICD-10-CM | POA: Diagnosis not present

## 2014-05-18 DIAGNOSIS — Z7982 Long term (current) use of aspirin: Secondary | ICD-10-CM | POA: Diagnosis not present

## 2014-05-18 DIAGNOSIS — I5033 Acute on chronic diastolic (congestive) heart failure: Secondary | ICD-10-CM | POA: Diagnosis present

## 2014-05-18 DIAGNOSIS — M810 Age-related osteoporosis without current pathological fracture: Secondary | ICD-10-CM | POA: Diagnosis present

## 2014-05-18 DIAGNOSIS — S72109A Unspecified trochanteric fracture of unspecified femur, initial encounter for closed fracture: Secondary | ICD-10-CM

## 2014-05-18 DIAGNOSIS — I4891 Unspecified atrial fibrillation: Secondary | ICD-10-CM

## 2014-05-18 DIAGNOSIS — N189 Chronic kidney disease, unspecified: Secondary | ICD-10-CM

## 2014-05-18 DIAGNOSIS — I160 Hypertensive urgency: Secondary | ICD-10-CM | POA: Diagnosis not present

## 2014-05-18 DIAGNOSIS — Z09 Encounter for follow-up examination after completed treatment for conditions other than malignant neoplasm: Secondary | ICD-10-CM

## 2014-05-18 DIAGNOSIS — E46 Unspecified protein-calorie malnutrition: Secondary | ICD-10-CM | POA: Diagnosis present

## 2014-05-18 DIAGNOSIS — J189 Pneumonia, unspecified organism: Secondary | ICD-10-CM | POA: Clinically undetermined

## 2014-05-18 LAB — CBC WITH DIFFERENTIAL/PLATELET
BASOS PCT: 0 % (ref 0–1)
Basophils Absolute: 0 10*3/uL (ref 0.0–0.1)
EOS ABS: 0.1 10*3/uL (ref 0.0–0.7)
EOS PCT: 1 % (ref 0–5)
HEMATOCRIT: 36.3 % (ref 36.0–46.0)
HEMOGLOBIN: 11.6 g/dL — AB (ref 12.0–15.0)
LYMPHS PCT: 11 % — AB (ref 12–46)
Lymphs Abs: 0.8 10*3/uL (ref 0.7–4.0)
MCH: 30.1 pg (ref 26.0–34.0)
MCHC: 32 g/dL (ref 30.0–36.0)
MCV: 94 fL (ref 78.0–100.0)
Monocytes Absolute: 0.4 10*3/uL (ref 0.1–1.0)
Monocytes Relative: 5 % (ref 3–12)
NEUTROS ABS: 6.2 10*3/uL (ref 1.7–7.7)
NEUTROS PCT: 83 % — AB (ref 43–77)
Platelets: 234 10*3/uL (ref 150–400)
RBC: 3.86 MIL/uL — ABNORMAL LOW (ref 3.87–5.11)
RDW: 15.5 % (ref 11.5–15.5)
WBC: 7.5 10*3/uL (ref 4.0–10.5)

## 2014-05-18 LAB — URINALYSIS, ROUTINE W REFLEX MICROSCOPIC
Bilirubin Urine: NEGATIVE
Glucose, UA: NEGATIVE mg/dL
Hgb urine dipstick: NEGATIVE
Ketones, ur: NEGATIVE mg/dL
Leukocytes, UA: NEGATIVE
Nitrite: NEGATIVE
Protein, ur: NEGATIVE mg/dL
Specific Gravity, Urine: 1.013 (ref 1.005–1.030)
Urobilinogen, UA: 0.2 mg/dL (ref 0.0–1.0)
pH: 7 (ref 5.0–8.0)

## 2014-05-18 LAB — PROTIME-INR
INR: 1.03 (ref 0.00–1.49)
PROTHROMBIN TIME: 13.6 s (ref 11.6–15.2)

## 2014-05-18 LAB — TYPE AND SCREEN
ABO/RH(D): O POS
Antibody Screen: NEGATIVE

## 2014-05-18 LAB — SURGICAL PCR SCREEN
MRSA, PCR: NEGATIVE
STAPHYLOCOCCUS AUREUS: NEGATIVE

## 2014-05-18 LAB — BASIC METABOLIC PANEL
ANION GAP: 6 (ref 5–15)
BUN: 17 mg/dL (ref 6–23)
CO2: 25 mmol/L (ref 19–32)
Calcium: 8.4 mg/dL (ref 8.4–10.5)
Chloride: 111 mmol/L (ref 96–112)
Creatinine, Ser: 1.33 mg/dL — ABNORMAL HIGH (ref 0.50–1.10)
GFR calc Af Amer: 40 mL/min — ABNORMAL LOW (ref >90)
GFR calc non Af Amer: 34 mL/min — ABNORMAL LOW (ref >90)
GLUCOSE: 140 mg/dL — AB (ref 70–99)
Potassium: 4.2 mmol/L (ref 3.5–5.1)
Sodium: 142 mmol/L (ref 135–145)

## 2014-05-18 MED ORDER — ALBUTEROL SULFATE (2.5 MG/3ML) 0.083% IN NEBU: 2.5000 mg | INHALATION_SOLUTION | RESPIRATORY_TRACT | Status: DC | PRN

## 2014-05-18 MED ORDER — BENAZEPRIL HCL 20 MG PO TABS
20.0000 mg | ORAL_TABLET | Freq: Every day | ORAL | Status: DC
  Filled 2014-05-18 (×2): qty 1

## 2014-05-18 MED ORDER — SENNA 8.6 MG PO TABS
1.0000 | ORAL_TABLET | Freq: Two times a day (BID) | ORAL | Status: DC | PRN
  Administered 2014-05-22 (×2): 8.6 mg via ORAL
  Filled 2014-05-18 (×2): qty 1

## 2014-05-18 MED ORDER — ONDANSETRON HCL 4 MG/2ML IJ SOLN
4.0000 mg | Freq: Once | INTRAMUSCULAR | Status: AC
  Administered 2014-05-18: 4 mg via INTRAVENOUS
  Filled 2014-05-18: qty 2

## 2014-05-18 MED ORDER — METOPROLOL TARTRATE 25 MG PO TABS
12.5000 mg | ORAL_TABLET | Freq: Two times a day (BID) | ORAL | Status: DC
  Administered 2014-05-18 – 2014-05-24 (×10): 12.5 mg via ORAL
  Filled 2014-05-18 (×12): qty 1

## 2014-05-18 MED ORDER — ASPIRIN 81 MG PO CHEW
81.0000 mg | CHEWABLE_TABLET | Freq: Every day | ORAL | Status: DC
  Filled 2014-05-18: qty 1

## 2014-05-18 MED ORDER — ONDANSETRON HCL 4 MG PO TABS: 4.0000 mg | ORAL_TABLET | Freq: Four times a day (QID) | ORAL | Status: DC | PRN

## 2014-05-18 MED ORDER — SODIUM CHLORIDE 0.9 % IV SOLN
INTRAVENOUS | Status: DC
  Administered 2014-05-19: 05:00:00 via INTRAVENOUS

## 2014-05-18 MED ORDER — SIMVASTATIN 20 MG PO TABS
20.0000 mg | ORAL_TABLET | Freq: Every day | ORAL | Status: DC
  Administered 2014-05-18 – 2014-05-23 (×5): 20 mg via ORAL
  Filled 2014-05-18 (×2): qty 1
  Filled 2014-05-18: qty 2
  Filled 2014-05-18 (×2): qty 1
  Filled 2014-05-18 (×2): qty 2
  Filled 2014-05-18: qty 1

## 2014-05-18 MED ORDER — DOCUSATE SODIUM 100 MG PO CAPS
100.0000 mg | ORAL_CAPSULE | Freq: Two times a day (BID) | ORAL | Status: DC
Start: 2014-05-18 — End: 2014-05-24
  Administered 2014-05-18 – 2014-05-24 (×11): 100 mg via ORAL
  Filled 2014-05-18 (×11): qty 1

## 2014-05-18 MED ORDER — FENTANYL CITRATE (PF) 100 MCG/2ML IJ SOLN
100.0000 ug | Freq: Once | INTRAMUSCULAR | Status: AC
  Administered 2014-05-18: 100 ug via INTRAVENOUS
  Filled 2014-05-18: qty 2

## 2014-05-18 MED ORDER — FENTANYL CITRATE (PF) 100 MCG/2ML IJ SOLN
50.0000 ug | Freq: Once | INTRAMUSCULAR | Status: AC
  Administered 2014-05-18: 50 ug via INTRAVENOUS
  Filled 2014-05-18: qty 2

## 2014-05-18 MED ORDER — ALPRAZOLAM 0.25 MG PO TABS
0.2500 mg | ORAL_TABLET | Freq: Every evening | ORAL | Status: DC | PRN
  Administered 2014-05-19 – 2014-05-20 (×2): 0.25 mg via ORAL
  Filled 2014-05-18 (×2): qty 1

## 2014-05-18 MED ORDER — MORPHINE SULFATE 2 MG/ML IJ SOLN
1.0000 mg | INTRAMUSCULAR | Status: DC | PRN
  Filled 2014-05-18 (×2): qty 1

## 2014-05-18 MED ORDER — ONDANSETRON HCL 4 MG/2ML IJ SOLN: 4.0000 mg | Freq: Four times a day (QID) | INTRAMUSCULAR | Status: DC | PRN

## 2014-05-18 MED ORDER — TRAMADOL HCL 50 MG PO TABS
50.0000 mg | ORAL_TABLET | Freq: Four times a day (QID) | ORAL | Status: DC | PRN
  Administered 2014-05-19 – 2014-05-22 (×3): 50 mg via ORAL
  Filled 2014-05-18 (×4): qty 1

## 2014-05-18 MED ORDER — DORZOLAMIDE HCL-TIMOLOL MAL 2-0.5 % OP SOLN
1.0000 [drp] | Freq: Two times a day (BID) | OPHTHALMIC | Status: DC
  Administered 2014-05-18 – 2014-05-24 (×11): 1 [drp] via OPHTHALMIC
  Filled 2014-05-18 (×3): qty 10

## 2014-05-18 MED ORDER — ASPIRIN 81 MG PO TABS: 81.0000 mg | ORAL_TABLET | Freq: Every day | ORAL | Status: DC

## 2014-05-18 MED ORDER — ENOXAPARIN SODIUM 30 MG/0.3ML ~~LOC~~ SOLN
30.0000 mg | SUBCUTANEOUS | Status: DC
  Filled 2014-05-18: qty 0.3

## 2014-05-18 NOTE — ED Provider Notes (Signed)
Medical screening examination/treatment/procedure(s) were conducted as a shared visit with non-physician practitioner(s) and myself.  I personally evaluated the patient during the encounter.   EKG Interpretation   Date/Time:  Tuesday May 18 2014 11:59:17 EDT Ventricular Rate:  62 PR Interval:  228 QRS Duration: 89 QT Interval:  445 QTC Calculation: 452 R Axis:   23 Text Interpretation:  Sinus rhythm Atrial premature complex Prolonged PR  interval Left ventricular hypertrophy Anterior Q waves, possibly due to  LVH Confirmed by Freida BusmanALLEN  MD, Nozomi Mettler ([id number]) on 05/18/2014 2:42:31 PM       Patient here after having a mechanical fall. X-ray results noted with fractures and will consult patient's orthopedist and she will be admitted to the hospital  Lorre NickAnthony Narvel Kozub, MD 05/18/14 1442

## 2014-05-18 NOTE — ED Notes (Addendum)
Per EMS, Pt, from home, c/o L shoulder pain after a stumble and fall this morning.  Denies pain.  EMS reports that the Pt hit her head.  Pt is on blood thinners.  Fentanyl given en route.  Pt is at neuro baseline.    Upon assessment, Pt c/o 10/10 L shoulder and L hip pain.

## 2014-05-18 NOTE — ED Notes (Signed)
Bed: [id number] Expected date:  Expected time:  Means of arrival:  Comments: EMS- elderly, fall, shoulder dislocation?

## 2014-05-18 NOTE — Progress Notes (Signed)
Ortho PA, Lanney GinsMatthew Babish returned page at [phone], and stated to stop lovenox, and 81mg  aspirin before surgery tomorrow. PA asked to start mechanical VTE, PAS hose for patient at this time.

## 2014-05-18 NOTE — Progress Notes (Signed)
MD paged about stopping aspirin 81mg , and lovenox 30mg  injection, due to patient being scheduled for surgery tomorrow at 1230. MD requested to call ortho physician and discuss with them their preference about VTE prophylaxis the night before surgery.

## 2014-05-18 NOTE — H&P (Addendum)
History and Physical  Mancel Parsonsrnestine E Abbasi ZOX:[id number] DOB: 1923-08-19 DOA: 05/18/2014  Referring physician: Francee PiccoloJennifer Piepenbrink, ED PA-C PCP: Hollice EspyGATES,DONNA RUTH, MD  Outpatient Specialists:  1. Cardiology: Dr. Albertina ParrHilty-apparently sees for HTN. 2. Nephrology: Dr. Casimiro NeedleAlvin Powell  Chief Complaint: Fall at home followed by left lower extremity and shoulder pain.  HPI: Mancel Parsonsrnestine E Bothwell is a 81 y.o. female , widowed, lives at home with 2 daughters, ambulates with the help of a walker/wheelchair, PMH of essential hypertension, HLD, stage III chronic kidney disease, hospitalized 04/17/14-04/23/14 for sepsis secondary to pyelonephritis, severe hyponatremia (sodium 106), acute blood loss anemia, elevated troponin from demand ischemia in the setting of severe illness, acute encephalopathy and intertrochanteric fracture of right hip for which she underwent medullary nail fixation, now presented with left shoulder and lower extremity pain following mechanical fall at home. This morning, patient ambulated with the help of a walker to the front door in an attempt to open it for the landscaper's. The next thing she remembers falling on the floor. She clearly denies dizziness, lightheadedness, passing out, chest pain or palpitations. Her daughter witnessed part of the fall and ran to help her. Patient hit her head and left shoulder to the wall prior to falling down. She immediately experienced severe left shoulder and left thigh area pain which were made worse with movement and she was unable to get up. She was assisted up to the wheelchair. In the ED, workup reveals creatinine 1.33, hemoglobin 11.6, chest x-ray without acute disease, CT head and cervical spine without acute findings, left shoulder x-ray with fracture of the left humeral surgical neck and left hip x-ray shows communited intertrochanteric femur fracture of the left. Orthopedic surgeon/Dr. Linna CapriceSwinteck was consulted by ED PA and plans for surgery most likely  for tomorrow. Hospitalist admission was requested.  Review of Systems: All systems reviewed and apart from history of presenting illness, are negative.  Past Medical History  Diagnosis Date  . Hypertension   . Hyperlipidemia   . Gout   . Osteoporosis   . CKD (chronic kidney disease) stage 4, GFR 15-29 ml/min   . Glaucoma   . Vitamin D deficiency    Past Surgical History  Procedure Laterality Date  . Breast surgery Bilateral 1970    mastectomies - fiboradenoma  . Eye surgery  2003, 2004  . Temporal artery biopsy / ligation  2001  . Femur im nail Right 04/21/2014    Procedure: right proximal femur ORIF ;  Surgeon: Samson FredericBrian Swinteck, MD;  Location: [facility];  Service: Orthopedics;  Laterality: Right;   Social History:  reports that she has never smoked. She has never used smokeless tobacco. She reports that she does not drink alcohol or use illicit drugs. Widowed. Lives with 2 daughters. Moves around with the help of a walker and wheelchair.  No Known Allergies  Family History  Problem Relation Age of Onset  . Heart attack Mother     MI while in hospital, RA, HTN  . Stroke Father     MI, CAD, alcoholism  . Cancer Brother     prostate cancer  . Heart Problems Sister     pacemaker, DM, RA, HTN    Prior to Admission medications   Medication Sig Start Date End Date Taking? Authorizing Provider  acetaminophen (TYLENOL) 325 MG tablet Take 2 tablets (650 mg total) by mouth every 4 (four) hours as needed for mild pain (temp > 101.5). 04/24/14  Yes Maryruth Bunhristina P Rama, MD  alendronate (FOSAMAX)  70 MG tablet Take 1 tablet by mouth once a week. 08/31/13  Yes Historical Provider, MD  ALPRAZolam Prudy Feeler) 0.5 MG tablet Take 0.5-1 tablets (0.25-0.5 mg total) by mouth at bedtime as needed for anxiety. 04/24/14  Yes Maryruth Bun Rama, MD  aspirin 81 MG tablet Take 81 mg by mouth daily.   Yes Historical Provider, MD  benazepril (LOTENSIN) 20 MG tablet Take 1 tablet (20 mg total) by mouth daily. 04/02/14  Yes  Chrystie Nose, MD  docusate sodium (COLACE) 100 MG capsule Take 1 capsule (100 mg total) by mouth 2 (two) times daily. 04/24/14  Yes Christina P Rama, MD  dorzolamide-timolol (COSOPT) 22.3-6.8 MG/ML ophthalmic solution Place 1 drop into both eyes 2 (two) times daily. 09/19/13  Yes Historical Provider, MD  enoxaparin (LOVENOX) 30 MG/0.3ML injection Inject 0.3 mLs (30 mg total) into the skin daily. 04/24/14 05/22/14 Yes Christina P Rama, MD  metoprolol tartrate (LOPRESSOR) 12.5 mg TABS tablet Take 12.5 mg by mouth 2 (two) times daily.   Yes Historical Provider, MD  senna (SENOKOT) 8.6 MG TABS tablet Take 1 tablet (8.6 mg total) by mouth 2 (two) times daily. Patient taking differently: Take 1 tablet by mouth 2 (two) times daily as needed for mild constipation.  04/24/14  Yes Christina P Rama, MD  simvastatin (ZOCOR) 20 MG tablet Take 1 tablet by mouth daily. 08/27/13  Yes Historical Provider, MD  traMADol (ULTRAM) 50 MG tablet Take 1 tablet (50 mg total) by mouth every 6 (six) hours as needed for moderate pain or severe pain. 04/24/14  Yes Christina P Rama, MD  Vitamin D, Ergocalciferol, (DRISDOL) 50000 UNITS CAPS capsule Take 1 capsule by mouth every 14 (fourteen) days. 09/17/13  Yes Historical Provider, MD  furosemide (LASIX) 20 MG tablet Take 1 tablet by mouth daily. 05/18/14   Historical Provider, MD   Physical Exam: Filed Vitals:   05/18/14 1133 05/18/14 1144 05/18/14 1200 05/18/14 1530  BP:  175/80 158/59 149/60  Pulse:  56 56 66  Temp:  97.5 F (36.4 C)  97.5 F (36.4 C)  TempSrc:  Oral  Oral  Resp:  18 23 16   SpO2: 96% 94% 95% 95%     General exam: Small built and thinly nourished pleasant elderly female lying comfortably supine in bed.  Head, eyes and ENT: Nontraumatic and normocephalic. Pupils equally reacting to light and accommodation. Oral mucosa moist.  Neck: Supple. No JVD, carotid bruit or thyromegaly.  Lymphatics: No lymphadenopathy.  Respiratory system: Clear to auscultation. No  increased work of breathing.  Cardiovascular system: S1 and S2 heard, RRR. No JVD, murmurs, gallops, clicks or pedal edema.  Gastrointestinal system: Abdomen is nondistended, soft and nontender. Normal bowel sounds heard. No organomegaly or masses appreciated.  Central nervous system: Alert and oriented. No focal neurological deficits.  Extremities: Symmetric 5 x 5 power except left upper and lower extremity power not assessed secondary to fractures and related pain. Peripheral pulses symmetrically felt. Left upper extremity in sling. Left lower extremity shortened and externally rotated.  Skin: No rashes or acute findings.  Musculoskeletal system: Negative exam.  Psychiatry: Pleasant and cooperative.   Labs on Admission:  Basic Metabolic Panel:  Recent Labs Lab 05/18/14 1235  NA 142  K 4.2  CL 111  CO2 25  GLUCOSE 140*  BUN 17  CREATININE 1.33*  CALCIUM 8.4   Liver Function Tests: No results for input(s): AST, ALT, ALKPHOS, BILITOT, PROT, ALBUMIN in the last 168 hours. No results for input(s): LIPASE,  AMYLASE in the last 168 hours. No results for input(s): AMMONIA in the last 168 hours. CBC:  Recent Labs Lab 05/18/14 1235  WBC 7.5  NEUTROABS 6.2  HGB 11.6*  HCT 36.3  MCV 94.0  PLT 234   Cardiac Enzymes: No results for input(s): CKTOTAL, CKMB, CKMBINDEX, TROPONINI in the last 168 hours.  BNP (last 3 results) No results for input(s): PROBNP in the last 8760 hours. CBG: No results for input(s): GLUCAP in the last 168 hours.  Radiological Exams on Admission: Dg Chest 1 View  05/18/2014   CLINICAL DATA:  Left shoulder pain after fall.  Initial encounter.  EXAM: CHEST  1 VIEW  COMPARISON:  04/17/2014  FINDINGS: Borderline cardiomegaly, similar to previous when accounting for differences in technique. Aortic and hilar contours are stable.  There is no edema, consolidation, effusion, or pneumothorax.  No appreciable fracture. Scoliotic curvature of the lower  thoracic and lumbar spine is stable.  IMPRESSION: No active disease.   Electronically Signed   By: Marnee Spring M.D.   On: 05/18/2014 14:18   Ct Head Wo Contrast  05/18/2014   CLINICAL DATA:  81 year old hypertensive female complaining of left shoulder pain after fall this morning. Initial encounter.  EXAM: CT HEAD WITHOUT CONTRAST  CT CERVICAL SPINE WITHOUT CONTRAST  TECHNIQUE: Multidetector CT imaging of the head and cervical spine was performed following the standard protocol without intravenous contrast. Multiplanar CT image reconstructions of the cervical spine were also generated.  COMPARISON:  04/17/2014 head CT.  No comparison cervical spine CT.  FINDINGS: On the scout view, left humeral neck fracture is noted.  CT HEAD FINDINGS  No skull fracture or intracranial hemorrhage.  Small vessel disease type changes without CT evidence of large acute infarct.  Global atrophy without hydrocephalus.  No intracranial mass lesion noted on this unenhanced exam.  Vascular calcifications.  Orbital structures unremarkable.  Mastoid air cells, middle ear cavities and visualized paranasal sinuses are clear.  CT CERVICAL SPINE FINDINGS  No cervical spine fracture.  No abnormal prevertebral soft tissue swelling.  Degenerative changes cervical spine with segmental ossification posterior longitudinal ligament C4-5 level. Spinal stenosis and cord flattening most prominent C4-5 and C5-6. If ligamentous injury or cord injury were of high clinical concern, MR imaging could be obtained for further delineation.  T2 superior endplate Schmorl's node deformity/compression fracture of indeterminate age. Acute injury not excluded.  Scarring lung apices.  IMPRESSION: On the scout view, left humeral neck fracture is noted. Recommend follow-up plain film examination for further delineation.  CT HEAD  No skull fracture or intracranial hemorrhage.  Small vessel disease type changes without CT evidence of large acute infarct.  Global  atrophy without hydrocephalus.  CT CERVICAL SPINE  No cervical spine fracture.  Degenerative changes cervical spine with spine stenosis and cord flattening most prominent C4-5 and C5-6 level. If cord injury were of high clinical concern, MR imaging could be obtained for further delineation.  T2 superior endplate Schmorl's node deformity/compression fracture (20% loss of height) of indeterminate age. Acute injury not excluded.   Electronically Signed   By: Lacy Duverney M.D.   On: 05/18/2014 14:17   Ct Cervical Spine Wo Contrast  05/18/2014   CLINICAL DATA:  81 year old hypertensive female complaining of left shoulder pain after fall this morning. Initial encounter.  EXAM: CT HEAD WITHOUT CONTRAST  CT CERVICAL SPINE WITHOUT CONTRAST  TECHNIQUE: Multidetector CT imaging of the head and cervical spine was performed following the standard protocol without intravenous contrast.  Multiplanar CT image reconstructions of the cervical spine were also generated.  COMPARISON:  04/17/2014 head CT.  No comparison cervical spine CT.  FINDINGS: On the scout view, left humeral neck fracture is noted.  CT HEAD FINDINGS  No skull fracture or intracranial hemorrhage.  Small vessel disease type changes without CT evidence of large acute infarct.  Global atrophy without hydrocephalus.  No intracranial mass lesion noted on this unenhanced exam.  Vascular calcifications.  Orbital structures unremarkable.  Mastoid air cells, middle ear cavities and visualized paranasal sinuses are clear.  CT CERVICAL SPINE FINDINGS  No cervical spine fracture.  No abnormal prevertebral soft tissue swelling.  Degenerative changes cervical spine with segmental ossification posterior longitudinal ligament C4-5 level. Spinal stenosis and cord flattening most prominent C4-5 and C5-6. If ligamentous injury or cord injury were of high clinical concern, MR imaging could be obtained for further delineation.  T2 superior endplate Schmorl's node  deformity/compression fracture of indeterminate age. Acute injury not excluded.  Scarring lung apices.  IMPRESSION: On the scout view, left humeral neck fracture is noted. Recommend follow-up plain film examination for further delineation.  CT HEAD  No skull fracture or intracranial hemorrhage.  Small vessel disease type changes without CT evidence of large acute infarct.  Global atrophy without hydrocephalus.  CT CERVICAL SPINE  No cervical spine fracture.  Degenerative changes cervical spine with spine stenosis and cord flattening most prominent C4-5 and C5-6 level. If cord injury were of high clinical concern, MR imaging could be obtained for further delineation.  T2 superior endplate Schmorl's node deformity/compression fracture (20% loss of height) of indeterminate age. Acute injury not excluded.   Electronically Signed   By: Lacy Duverney M.D.   On: 05/18/2014 14:17   Dg Shoulder Left  05/18/2014   CLINICAL DATA:  81 year old female post fall with left shoulder pain. Initial encounter.  EXAM: LEFT SHOULDER - 2+ VIEW  COMPARISON:  None.  FINDINGS: Fracture of the left humeral surgical neck with slight rotation of the proximal humeral head/surgical neck with respect to the adjacent humeral shaft.  Calcified tortuous aorta.  IMPRESSION: Fracture of the left humeral surgical neck with slight rotation of the proximal humeral head/surgical neck with respect to the adjacent humeral shaft.   Electronically Signed   By: Lacy Duverney M.D.   On: 05/18/2014 14:20   Dg Hip Unilat With Pelvis 2-3 Views Left  05/18/2014   CLINICAL DATA:  Pain following fall  EXAM: LEFT HIP (WITH PELVIS) 2-3 VIEWS  COMPARISON:  April 17, 2014  FINDINGS: Frontal pelvis as well as frontal and lateral left hip images were obtained. There is a comminuted fracture of the left intertrochanteric region with varus angulation at the fracture site. There is avulsion of the lesser trochanter medially. On the right, there is postoperative change  with screw and rod fixation through a recent intertrochanteric and subtrochanteric femur fracture with major fracture fragments in near anatomic alignment. No dislocations appreciable. There is moderate narrowing of both hip joints. There are small calcified uterine leiomyomas within the pelvis. There is degenerative change in the lower lumbar spine.  IMPRESSION: Comminuted intertrochanteric femur fracture on the left with varus angulation at the fracture site and avulsion of the lesser trochanter. Postoperative change on the right. Moderate narrowing both hip joints. No dislocation.   Electronically Signed   By: Bretta Bang III M.D.   On: 05/18/2014 14:18   Dg Femur Min 2 Views Left  05/18/2014   CLINICAL DATA:  Left  intertrochanteric fracture  EXAM: LEFT FEMUR 2 VIEWS  COMPARISON:  Pelvis and left hip images obtained earlier in the day  FINDINGS: Frontal and lateral views obtained. There is a comminuted intertrochanteric femur fracture on the left with varus angulation of the fracture site and medial avulsion of the lesser trochanter. More distally, no fracture. No dislocation. There is mild narrowing of the left hip joint.  IMPRESSION: Comminuted intertrochanteric femur fracture proximally. No fracture more distally. No dislocation. Mild narrowing left hip joint.   Electronically Signed   By: Bretta Bang III M.D.   On: 05/18/2014 17:28    EKG: Independently reviewed. Sinus rhythm, normal axis, LVH, Q waves in leads V1-2 and no acute changes.  Assessment/Plan 81 y.o. female , widowed, lives at home with 2 daughters, ambulates with the help of a walker/wheelchair, PMH of essential hypertension, HLD, stage III chronic kidney disease, hospitalized 04/17/14-04/23/14 for sepsis secondary to pyelonephritis, severe hyponatremia (sodium 106), acute blood loss anemia, elevated troponin from demand ischemia in the setting of severe illness, acute encephalopathy and intertrochanteric fracture of right hip  for which she underwent medullary nail fixation, now presented with left shoulder and lower extremity pain following mechanical fall at home. Admitted for intertrochanteric fracture of left femur and left humerus awaiting orthopedic evaluation for possible surgery 4/27.   Principal Problem:   Fracture, intertrochanteric, left femur - Orthopedics have been consulted by EDP and plan possible surgery 4/27 - Nothing by mouth after midnight - Based on available data, patient is at low risk for perioperative cardiovascular events. May proceed with needed surgery without any further cardiac workup with close monitoring and maintain even in and out fluid balance.  Active Problems:   Fracture of left humerus - Continue left shoulder sling - Management per orthopedics    Protein-calorie malnutrition, severe - Continue dietary supplementation    CKD (chronic kidney disease), stage III - creatinine at baseline - Seen by Dr. Lowell Guitar on 4/25 and started on Lasix which she has not yet started - Clinically euvolemic. Hold Lasix for now.    Fall at home - Mechanical fall - We'll likely need SNF post op - Urine microscopy: Not indicated of UTI    Essential hypertension - Continue metoprolol    Anemia, chronic disease - Follow CBCs   Code Status: Full  Family Communication: Discussed with patient's 2 daughters at bedside.  Disposition Plan: We will likely need SNF at discharge.   Time spent: 60 minutes  Khelani Kops, MD, FACP, FHM. [facility] Pager [phone]  If 7PM-7AM, please contact night-coverage [email or web address] Password [facility] 05/18/2014, 5:34 PM

## 2014-05-18 NOTE — ED Notes (Signed)
Pt recently had surgery(rod placed ) right side. Pt fell today on left side she c/o 10/10 pain left shoulder and left hip. Pt also reports hitting head. Shortening left leg

## 2014-05-18 NOTE — ED Provider Notes (Signed)
CSN: [id number]     Arrival date & time 05/18/14  1131 History   First MD Initiated Contact with Patient 05/18/14 1134     Chief Complaint  Patient presents with  . Fall  . Shoulder Pain     (Consider location/radiation/quality/duration/timing/severity/associated sxs/prior Treatment) HPI Comments: Patient is an 81 yo F PMHx significant for HTN, HLD, CKD presenting to the ED for evaluation of a fall. Patient states she recently got home from having her right hip replaced at the end of March. She states she is attempting to walk open the bathroom door lost her footing she believes causing fall or hit her head left shoulder and left hip on the floor. She does not sure if she lost consciousness. Denies any precipitating chest pain, shortness of breath, headache. Denies any visual disturbance, nausea, vomiting.  Patient is a 81 y.o. female presenting with fall and shoulder pain.  Fall Associated symptoms include arthralgias and myalgias. Pertinent negatives include no abdominal pain, chest pain, fever, nausea, neck pain or vomiting.  Shoulder Pain Associated symptoms: no fever and no neck pain     Past Medical History  Diagnosis Date  . Hypertension   . Hyperlipidemia   . Gout   . Osteoporosis   . CKD (chronic kidney disease) stage 4, GFR 15-29 ml/min   . Glaucoma   . Vitamin D deficiency    Past Surgical History  Procedure Laterality Date  . Breast surgery Bilateral 1970    mastectomies - fiboradenoma  . Eye surgery  2003, 2004  . Temporal artery biopsy / ligation  2001  . Femur im nail Right 04/21/2014    Procedure: right proximal femur ORIF ;  Surgeon: Samson Frederic, MD;  Location: [facility];  Service: Orthopedics;  Laterality: Right;   Family History  Problem Relation Age of Onset  . Heart attack Mother     MI while in hospital, RA, HTN  . Stroke Father     MI, CAD, alcoholism  . Cancer Brother     prostate cancer  . Heart Problems Sister     pacemaker, DM, RA, HTN    History  Substance Use Topics  . Smoking status: Never Smoker   . Smokeless tobacco: Never Used  . Alcohol Use: No   OB History    No data available     Review of Systems  Constitutional: Negative for fever.  Respiratory: Negative for shortness of breath.   Cardiovascular: Negative for chest pain.  Gastrointestinal: Negative for nausea, vomiting and abdominal pain.  Musculoskeletal: Positive for myalgias and arthralgias. Negative for neck pain.  All other systems reviewed and are negative.     Allergies  Review of patient's allergies indicates no known allergies.  Home Medications   Prior to Admission medications   Medication Sig Start Date End Date Taking? Authorizing Provider  acetaminophen (TYLENOL) 325 MG tablet Take 2 tablets (650 mg total) by mouth every 4 (four) hours as needed for mild pain (temp > 101.5). 04/24/14  Yes Christina P Rama, MD  alendronate (FOSAMAX) 70 MG tablet Take 1 tablet by mouth once a week. 08/31/13  Yes Historical Provider, MD  ALPRAZolam Prudy Feeler) 0.5 MG tablet Take 0.5-1 tablets (0.25-0.5 mg total) by mouth at bedtime as needed for anxiety. 04/24/14  Yes Maryruth Bun Rama, MD  aspirin 81 MG tablet Take 81 mg by mouth daily.   Yes Historical Provider, MD  benazepril (LOTENSIN) 20 MG tablet Take 1 tablet (20 mg total) by mouth daily. 04/02/14  Yes Chrystie NoseKenneth C Hilty, MD  docusate sodium (COLACE) 100 MG capsule Take 1 capsule (100 mg total) by mouth 2 (two) times daily. 04/24/14  Yes Christina P Rama, MD  dorzolamide-timolol (COSOPT) 22.3-6.8 MG/ML ophthalmic solution Place 1 drop into both eyes 2 (two) times daily. 09/19/13  Yes Historical Provider, MD  enoxaparin (LOVENOX) 30 MG/0.3ML injection Inject 0.3 mLs (30 mg total) into the skin daily. 04/24/14 05/22/14 Yes Christina P Rama, MD  metoprolol tartrate (LOPRESSOR) 12.5 mg TABS tablet Take 12.5 mg by mouth 2 (two) times daily.   Yes Historical Provider, MD  senna (SENOKOT) 8.6 MG TABS tablet Take 1 tablet (8.6  mg total) by mouth 2 (two) times daily. Patient taking differently: Take 1 tablet by mouth 2 (two) times daily as needed for mild constipation.  04/24/14  Yes Christina P Rama, MD  simvastatin (ZOCOR) 20 MG tablet Take 1 tablet by mouth daily. 08/27/13  Yes Historical Provider, MD  traMADol (ULTRAM) 50 MG tablet Take 1 tablet (50 mg total) by mouth every 6 (six) hours as needed for moderate pain or severe pain. 04/24/14  Yes Christina P Rama, MD  Vitamin D, Ergocalciferol, (DRISDOL) 50000 UNITS CAPS capsule Take 1 capsule by mouth every 14 (fourteen) days. 09/17/13  Yes Historical Provider, MD  feeding supplement, ENSURE ENLIVE, (ENSURE ENLIVE) LIQD Take 237 mLs by mouth 2 (two) times daily between meals. Patient not taking: Reported on 05/18/2014 04/24/14   Maryruth Bunhristina P Rama, MD  furosemide (LASIX) 20 MG tablet Take 1 tablet by mouth daily. 05/18/14   Historical Provider, MD   BP 158/59 mmHg  Pulse 56  Temp(Src) 97.5 F (36.4 C) (Oral)  Resp 23  SpO2 95% Physical Exam  Constitutional: She is oriented to person, place, and time. She appears well-developed and well-nourished. No distress.  HENT:  Head: Normocephalic and atraumatic.  Right Ear: External ear normal.  Left Ear: External ear normal.  Nose: Nose normal.  Mouth/Throat: No oropharyngeal exudate.  Eyes: Conjunctivae are normal.  Neck: Neck supple.  Cardiovascular: Normal rate, normal heart sounds and intact distal pulses.  An irregularly irregular rhythm present.  Pulmonary/Chest: Effort normal and breath sounds normal. No respiratory distress. She exhibits no tenderness.  Abdominal: Soft. There is no tenderness.  Musculoskeletal: She exhibits tenderness.       Right shoulder: Normal.       Left shoulder: She exhibits decreased range of motion, tenderness and bony tenderness. She exhibits no deformity.       Right hip: Normal.       Left hip: She exhibits decreased range of motion, decreased strength and tenderness.       Right knee:  Normal.       Left knee: Normal. She exhibits normal range of motion.       Cervical back: Normal.       Right upper leg: She exhibits tenderness.       Left upper leg: Normal.       Right lower leg: Normal.       Left lower leg: Normal.  Neurological: She is alert and oriented to person, place, and time.  Skin: Skin is warm and dry. She is not diaphoretic.  Nursing note and vitals reviewed.   ED Course  Procedures (including critical care time) Medications  ondansetron (ZOFRAN) injection 4 mg (4 mg Intravenous Given 05/18/14 1227)  fentaNYL (SUBLIMAZE) injection 50 mcg (50 mcg Intravenous Given 05/18/14 1227)  fentaNYL (SUBLIMAZE) injection 100 mcg (100 mcg Intravenous Given 05/18/14  1522)    Labs Review Labs Reviewed  BASIC METABOLIC PANEL - Abnormal; Notable for the following:    Glucose, Bld 140 (*)    Creatinine, Ser 1.33 (*)    GFR calc non Af Amer 34 (*)    GFR calc Af Amer 40 (*)    All other components within normal limits  CBC WITH DIFFERENTIAL/PLATELET - Abnormal; Notable for the following:    RBC 3.86 (*)    Hemoglobin 11.6 (*)    Neutrophils Relative % 83 (*)    Lymphocytes Relative 11 (*)    All other components within normal limits  PROTIME-INR  URINALYSIS, ROUTINE W REFLEX MICROSCOPIC  TYPE AND SCREEN    Imaging Review Dg Chest 1 View  05/18/2014   CLINICAL DATA:  Left shoulder pain after fall.  Initial encounter.  EXAM: CHEST  1 VIEW  COMPARISON:  04/17/2014  FINDINGS: Borderline cardiomegaly, similar to previous when accounting for differences in technique. Aortic and hilar contours are stable.  There is no edema, consolidation, effusion, or pneumothorax.  No appreciable fracture. Scoliotic curvature of the lower thoracic and lumbar spine is stable.  IMPRESSION: No active disease.   Electronically Signed   By: Marnee Spring M.D.   On: 05/18/2014 14:18   Ct Head Wo Contrast  05/18/2014   CLINICAL DATA:  81 year old hypertensive female complaining of left  shoulder pain after fall this morning. Initial encounter.  EXAM: CT HEAD WITHOUT CONTRAST  CT CERVICAL SPINE WITHOUT CONTRAST  TECHNIQUE: Multidetector CT imaging of the head and cervical spine was performed following the standard protocol without intravenous contrast. Multiplanar CT image reconstructions of the cervical spine were also generated.  COMPARISON:  04/17/2014 head CT.  No comparison cervical spine CT.  FINDINGS: On the scout view, left humeral neck fracture is noted.  CT HEAD FINDINGS  No skull fracture or intracranial hemorrhage.  Small vessel disease type changes without CT evidence of large acute infarct.  Global atrophy without hydrocephalus.  No intracranial mass lesion noted on this unenhanced exam.  Vascular calcifications.  Orbital structures unremarkable.  Mastoid air cells, middle ear cavities and visualized paranasal sinuses are clear.  CT CERVICAL SPINE FINDINGS  No cervical spine fracture.  No abnormal prevertebral soft tissue swelling.  Degenerative changes cervical spine with segmental ossification posterior longitudinal ligament C4-5 level. Spinal stenosis and cord flattening most prominent C4-5 and C5-6. If ligamentous injury or cord injury were of high clinical concern, MR imaging could be obtained for further delineation.  T2 superior endplate Schmorl's node deformity/compression fracture of indeterminate age. Acute injury not excluded.  Scarring lung apices.  IMPRESSION: On the scout view, left humeral neck fracture is noted. Recommend follow-up plain film examination for further delineation.  CT HEAD  No skull fracture or intracranial hemorrhage.  Small vessel disease type changes without CT evidence of large acute infarct.  Global atrophy without hydrocephalus.  CT CERVICAL SPINE  No cervical spine fracture.  Degenerative changes cervical spine with spine stenosis and cord flattening most prominent C4-5 and C5-6 level. If cord injury were of high clinical concern, MR imaging could  be obtained for further delineation.  T2 superior endplate Schmorl's node deformity/compression fracture (20% loss of height) of indeterminate age. Acute injury not excluded.   Electronically Signed   By: Lacy Duverney M.D.   On: 05/18/2014 14:17   Ct Cervical Spine Wo Contrast  05/18/2014   CLINICAL DATA:  81 year old hypertensive female complaining of left shoulder pain after fall this morning.  Initial encounter.  EXAM: CT HEAD WITHOUT CONTRAST  CT CERVICAL SPINE WITHOUT CONTRAST  TECHNIQUE: Multidetector CT imaging of the head and cervical spine was performed following the standard protocol without intravenous contrast. Multiplanar CT image reconstructions of the cervical spine were also generated.  COMPARISON:  04/17/2014 head CT.  No comparison cervical spine CT.  FINDINGS: On the scout view, left humeral neck fracture is noted.  CT HEAD FINDINGS  No skull fracture or intracranial hemorrhage.  Small vessel disease type changes without CT evidence of large acute infarct.  Global atrophy without hydrocephalus.  No intracranial mass lesion noted on this unenhanced exam.  Vascular calcifications.  Orbital structures unremarkable.  Mastoid air cells, middle ear cavities and visualized paranasal sinuses are clear.  CT CERVICAL SPINE FINDINGS  No cervical spine fracture.  No abnormal prevertebral soft tissue swelling.  Degenerative changes cervical spine with segmental ossification posterior longitudinal ligament C4-5 level. Spinal stenosis and cord flattening most prominent C4-5 and C5-6. If ligamentous injury or cord injury were of high clinical concern, MR imaging could be obtained for further delineation.  T2 superior endplate Schmorl's node deformity/compression fracture of indeterminate age. Acute injury not excluded.  Scarring lung apices.  IMPRESSION: On the scout view, left humeral neck fracture is noted. Recommend follow-up plain film examination for further delineation.  CT HEAD  No skull fracture or  intracranial hemorrhage.  Small vessel disease type changes without CT evidence of large acute infarct.  Global atrophy without hydrocephalus.  CT CERVICAL SPINE  No cervical spine fracture.  Degenerative changes cervical spine with spine stenosis and cord flattening most prominent C4-5 and C5-6 level. If cord injury were of high clinical concern, MR imaging could be obtained for further delineation.  T2 superior endplate Schmorl's node deformity/compression fracture (20% loss of height) of indeterminate age. Acute injury not excluded.   Electronically Signed   By: Lacy Duverney M.D.   On: 05/18/2014 14:17   Dg Shoulder Left  05/18/2014   CLINICAL DATA:  81 year old female post fall with left shoulder pain. Initial encounter.  EXAM: LEFT SHOULDER - 2+ VIEW  COMPARISON:  None.  FINDINGS: Fracture of the left humeral surgical neck with slight rotation of the proximal humeral head/surgical neck with respect to the adjacent humeral shaft.  Calcified tortuous aorta.  IMPRESSION: Fracture of the left humeral surgical neck with slight rotation of the proximal humeral head/surgical neck with respect to the adjacent humeral shaft.   Electronically Signed   By: Lacy Duverney M.D.   On: 05/18/2014 14:20   Dg Hip Unilat With Pelvis 2-3 Views Left  05/18/2014   CLINICAL DATA:  Pain following fall  EXAM: LEFT HIP (WITH PELVIS) 2-3 VIEWS  COMPARISON:  April 17, 2014  FINDINGS: Frontal pelvis as well as frontal and lateral left hip images were obtained. There is a comminuted fracture of the left intertrochanteric region with varus angulation at the fracture site. There is avulsion of the lesser trochanter medially. On the right, there is postoperative change with screw and rod fixation through a recent intertrochanteric and subtrochanteric femur fracture with major fracture fragments in near anatomic alignment. No dislocations appreciable. There is moderate narrowing of both hip joints. 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 There is degenerative change in the lower lumbar spine.  IMPRESSION: Comminuted intertrochanteric femur fracture on the left with varus angulation at the fracture site and avulsion of the lesser trochanter. Postoperative change on the right. Moderate narrowing both hip joints. No  dislocation.   Electronically Signed   By: Bretta Bang III M.D.   On: 05/18/2014 14:18     EKG Interpretation   Date/Time:  Tuesday May 18 2014 11:59:17 EDT Ventricular Rate:  62 PR Interval:  228 QRS Duration: 89 QT Interval:  445 QTC Calculation: 452 R Axis:   23 Text Interpretation:  Sinus rhythm Atrial premature complex Prolonged PR  interval Left ventricular hypertrophy Anterior Q waves, possibly due to  LVH Confirmed by Freida Busman  MD, ANTHONY ([id number]) on 05/18/2014 2:42:31 PM      MDM   Final diagnoses:  Fall  Fracture, intertrochanteric, left femur, closed, initial encounter  Left humeral fracture, closed, initial encounter    Filed Vitals:   05/18/14 1200  BP: 158/59  Pulse: 56  Temp:   Resp: 23   I have reviewed nursing notes, vital signs, and all appropriate lab and imaging results for this patient.  Patient presenting to the ED after a mechanical fall for evaluation of left sided shoulder and hip pain. Neurovascularly intact. Normal sensation. No evidence of compartment syndrome. Left lower extremity shortened and externally rotated. Imaging reviewed. No evidence of intracranial abnormality. X-rays reviewed. Dr. Linna Caprice consulted and will see the patient in consultation for surgery for repair of left hip fracture. Sling imobilzer placed on left shoulder. Patient will be admitted to hospitalist for further management and evaluation.   Patient d/w with Dr. Freida Busman, agrees with plan.    Francee Piccolo, PA-C 05/18/14 1557

## 2014-05-18 NOTE — Progress Notes (Addendum)
[facility] Home health services: PT/OT/Aide DME required: w/c with elevating leg rest, 3-1 PCP follow-up: Dr. Kevan NyGates on 06/03/14 @10 :45am  Pt d/c home from facility  Female family member at the bedside unable to recall name of the home health agency Female per admission RN did not answer various questions for her and Report anticipated disposition was "rehab" if pt unable to get a hospital bed or some one to stay with her around the clock  Cm confirm agency not [facility] nor [facility]  [facility] a [facility] [phone]  1625 [place] [facility] [place] [facility]  [facility] to [facility] to be followed for d/c needs

## 2014-05-18 NOTE — Progress Notes (Signed)
MRSA by PCR completed on 3/26, exactly 30 days ago. Patient was negative at time of screening, will not re-screen for surgery.

## 2014-05-18 NOTE — ED Notes (Signed)
I attempted twice to get patient labs and was unsuccessful.  I made nurse aware.

## 2014-05-19 ENCOUNTER — Inpatient Hospital Stay (HOSPITAL_COMMUNITY): Payer: Medicare Other

## 2014-05-19 ENCOUNTER — Inpatient Hospital Stay (HOSPITAL_COMMUNITY): Payer: Medicare Other | Admitting: Anesthesiology

## 2014-05-19 ENCOUNTER — Encounter (HOSPITAL_COMMUNITY)
Admission: EM | Disposition: A | Discharge status: Skilled Nursing Facility | Payer: Self-pay | Source: Home / Self Care | Attending: Internal Medicine

## 2014-05-19 ENCOUNTER — Encounter (HOSPITAL_COMMUNITY): Payer: Self-pay | Admitting: Anesthesiology

## 2014-05-19 HISTORY — PX: FEMUR IM NAIL: SHX1597

## 2014-05-19 LAB — BASIC METABOLIC PANEL
ANION GAP: 7 (ref 5–15)
BUN: 18 mg/dL (ref 6–23)
CALCIUM: 8.2 mg/dL — AB (ref 8.4–10.5)
CO2: 23 mmol/L (ref 19–32)
CREATININE: 1.32 mg/dL — AB (ref 0.50–1.10)
Chloride: 113 mmol/L — ABNORMAL HIGH (ref 96–112)
GFR calc Af Amer: 40 mL/min — ABNORMAL LOW (ref >90)
GFR, EST NON AFRICAN AMERICAN: 35 mL/min — AB (ref >90)
Glucose, Bld: 134 mg/dL — ABNORMAL HIGH (ref 70–99)
Potassium: 4.2 mmol/L (ref 3.5–5.1)
Sodium: 143 mmol/L (ref 135–145)

## 2014-05-19 LAB — CBC
HCT: 28.8 % — ABNORMAL LOW (ref 36.0–46.0)
HCT: 28.9 % — ABNORMAL LOW (ref 36.0–46.0)
Hemoglobin: 9.4 g/dL — ABNORMAL LOW (ref 12.0–15.0)
Hemoglobin: 9.4 g/dL — ABNORMAL LOW (ref 12.0–15.0)
MCH: 30.3 pg (ref 26.0–34.0)
MCH: 30.4 pg (ref 26.0–34.0)
MCHC: 32.6 g/dL (ref 30.0–36.0)
MCHC: 32.5 g/dL (ref 30.0–36.0)
MCV: 92.9 fL (ref 78.0–100.0)
MCV: 93.5 fL (ref 78.0–100.0)
PLATELETS: 194 10*3/uL (ref 150–400)
Platelets: 207 10*3/uL (ref 150–400)
RBC: 3.1 MIL/uL — AB (ref 3.87–5.11)
RBC: 3.09 MIL/uL — ABNORMAL LOW (ref 3.87–5.11)
RDW: 15.4 % (ref 11.5–15.5)
RDW: 15.4 % (ref 11.5–15.5)
WBC: 8.1 10*3/uL (ref 4.0–10.5)
WBC: 14.2 10*3/uL — ABNORMAL HIGH (ref 4.0–10.5)

## 2014-05-19 LAB — CREATININE, SERUM
CREATININE: 1.31 mg/dL — AB (ref 0.50–1.10)
GFR calc non Af Amer: 35 mL/min — ABNORMAL LOW (ref >90)
GFR, EST AFRICAN AMERICAN: 41 mL/min — AB (ref >90)

## 2014-05-19 SURGERY — INSERTION, INTRAMEDULLARY ROD, FEMUR
Anesthesia: General | Site: Hip | Laterality: Left

## 2014-05-19 MED ORDER — ISOPROPYL ALCOHOL 70 % SOLN
Status: AC
  Filled 2014-05-19: qty 480

## 2014-05-19 MED ORDER — MENTHOL 3 MG MT LOZG: 1.0000 | LOZENGE | OROMUCOSAL | Status: DC | PRN

## 2014-05-19 MED ORDER — LACTATED RINGERS IV SOLN
INTRAVENOUS | Status: DC
  Administered 2014-05-19: 1000 mL via INTRAVENOUS
  Administered 2014-05-19 (×2): via INTRAVENOUS

## 2014-05-19 MED ORDER — CEFAZOLIN SODIUM-DEXTROSE 2-3 GM-% IV SOLR
INTRAVENOUS | Status: AC
  Filled 2014-05-19: qty 50

## 2014-05-19 MED ORDER — FENTANYL CITRATE (PF) 100 MCG/2ML IJ SOLN
INTRAMUSCULAR | Status: AC
Start: 2014-05-19 — End: 2014-05-19
  Filled 2014-05-19: qty 2

## 2014-05-19 MED ORDER — FENTANYL CITRATE (PF) 100 MCG/2ML IJ SOLN
INTRAMUSCULAR | Status: AC
  Filled 2014-05-19: qty 2

## 2014-05-19 MED ORDER — SUCCINYLCHOLINE CHLORIDE 20 MG/ML IJ SOLN
INTRAMUSCULAR | Status: DC | PRN
  Administered 2014-05-19: 60 mg via INTRAVENOUS

## 2014-05-19 MED ORDER — LIDOCAINE HCL (CARDIAC) 20 MG/ML IV SOLN
INTRAVENOUS | Status: AC
  Filled 2014-05-19: qty 5

## 2014-05-19 MED ORDER — PROMETHAZINE HCL 25 MG/ML IJ SOLN: 6.2500 mg | INTRAMUSCULAR | Status: DC | PRN

## 2014-05-19 MED ORDER — ENOXAPARIN SODIUM 30 MG/0.3ML ~~LOC~~ SOLN
30.0000 mg | SUBCUTANEOUS | Status: DC
  Administered 2014-05-20 – 2014-05-22 (×3): 30 mg via SUBCUTANEOUS
  Filled 2014-05-19 (×3): qty 0.3

## 2014-05-19 MED ORDER — FENTANYL CITRATE (PF) 100 MCG/2ML IJ SOLN
INTRAMUSCULAR | Status: DC | PRN
  Administered 2014-05-19 (×4): 25 ug via INTRAVENOUS

## 2014-05-19 MED ORDER — ETOMIDATE 2 MG/ML IV SOLN
INTRAVENOUS | Status: DC | PRN
  Administered 2014-05-19: 8 mg via INTRAVENOUS

## 2014-05-19 MED ORDER — METOPROLOL TARTRATE 12.5 MG HALF TABLET
12.5000 mg | ORAL_TABLET | Freq: Once | ORAL | Status: AC
  Administered 2014-05-19: 12.5 mg via ORAL
  Filled 2014-05-19: qty 1

## 2014-05-19 MED ORDER — PHENOL 1.4 % MT LIQD: 1.0000 | OROMUCOSAL | Status: DC | PRN

## 2014-05-19 MED ORDER — PROPOFOL 10 MG/ML IV BOLUS
INTRAVENOUS | Status: AC
  Filled 2014-05-19: qty 20

## 2014-05-19 MED ORDER — LEVALBUTEROL HCL 1.25 MG/0.5ML IN NEBU
1.2500 mg | INHALATION_SOLUTION | Freq: Four times a day (QID) | RESPIRATORY_TRACT | Status: DC | PRN
  Filled 2014-05-19: qty 0.5

## 2014-05-19 MED ORDER — METOCLOPRAMIDE HCL 5 MG PO TABS
5.0000 mg | ORAL_TABLET | Freq: Three times a day (TID) | ORAL | Status: DC | PRN
  Administered 2014-05-20: 10 mg via ORAL
  Filled 2014-05-19 (×2): qty 2

## 2014-05-19 MED ORDER — METOCLOPRAMIDE HCL 5 MG/ML IJ SOLN
5.0000 mg | Freq: Three times a day (TID) | INTRAMUSCULAR | Status: DC | PRN
  Administered 2014-05-19: 5 mg via INTRAVENOUS
  Filled 2014-05-19: qty 2

## 2014-05-19 MED ORDER — HYDRALAZINE HCL 20 MG/ML IJ SOLN
3.0000 mg | Freq: Once | INTRAMUSCULAR | Status: AC
  Administered 2014-05-19: 3 mg via INTRAVENOUS

## 2014-05-19 MED ORDER — FENTANYL CITRATE (PF) 100 MCG/2ML IJ SOLN
25.0000 ug | INTRAMUSCULAR | Status: DC | PRN
  Administered 2014-05-19: 50 ug via INTRAVENOUS
  Administered 2014-05-19: 25 ug via INTRAVENOUS
  Administered 2014-05-19: 50 ug via INTRAVENOUS

## 2014-05-19 MED ORDER — PHENYLEPHRINE HCL 10 MG/ML IJ SOLN
INTRAMUSCULAR | Status: AC
  Filled 2014-05-19: qty 1

## 2014-05-19 MED ORDER — HYDRALAZINE HCL 20 MG/ML IJ SOLN
INTRAMUSCULAR | Status: AC
Start: 2014-05-19 — End: 2014-05-20
  Filled 2014-05-19: qty 1

## 2014-05-19 MED ORDER — HYDRALAZINE HCL 20 MG/ML IJ SOLN
INTRAMUSCULAR | Status: AC
  Filled 2014-05-19: qty 1

## 2014-05-19 MED ORDER — ETOMIDATE 2 MG/ML IV SOLN
INTRAVENOUS | Status: AC
  Filled 2014-05-19: qty 10

## 2014-05-19 MED ORDER — HYDRALAZINE HCL 20 MG/ML IJ SOLN: 2.5000 mg | Freq: Four times a day (QID) | INTRAMUSCULAR | Status: DC | PRN

## 2014-05-19 MED ORDER — HYDRALAZINE HCL 20 MG/ML IJ SOLN
2.5000 mg | Freq: Once | INTRAMUSCULAR | Status: AC
  Administered 2014-05-19: 3 mg via INTRAVENOUS

## 2014-05-19 MED ORDER — CEFAZOLIN SODIUM-DEXTROSE 2-3 GM-% IV SOLR
2.0000 g | Freq: Once | INTRAVENOUS | Status: AC
  Administered 2014-05-19: 2 g via INTRAVENOUS

## 2014-05-19 MED ORDER — MORPHINE SULFATE 2 MG/ML IJ SOLN
0.5000 mg | INTRAMUSCULAR | Status: DC | PRN
  Administered 2014-05-19 – 2014-05-21 (×3): 0.5 mg via INTRAVENOUS
  Filled 2014-05-19 (×3): qty 1

## 2014-05-19 MED ORDER — HYDRALAZINE HCL 20 MG/ML IJ SOLN
5.0000 mg | INTRAMUSCULAR | Status: DC | PRN
  Administered 2014-05-19 – 2014-05-20 (×2): 5 mg via INTRAVENOUS
  Filled 2014-05-19 (×2): qty 1

## 2014-05-19 MED ORDER — HYDRALAZINE HCL 10 MG PO TABS
10.0000 mg | ORAL_TABLET | Freq: Three times a day (TID) | ORAL | Status: DC
Start: 2014-05-19 — End: 2014-05-24
  Administered 2014-05-19 – 2014-05-24 (×9): 10 mg via ORAL
  Filled 2014-05-19 (×13): qty 1

## 2014-05-19 MED ORDER — ONDANSETRON HCL 4 MG/2ML IJ SOLN
INTRAMUSCULAR | Status: DC | PRN
  Administered 2014-05-19: 4 mg via INTRAVENOUS

## 2014-05-19 MED ORDER — PROPOFOL 10 MG/ML IV BOLUS
INTRAVENOUS | Status: DC | PRN
  Administered 2014-05-19: 70 mg via INTRAVENOUS

## 2014-05-19 MED ORDER — CEFAZOLIN SODIUM-DEXTROSE 2-3 GM-% IV SOLR
2.0000 g | Freq: Four times a day (QID) | INTRAVENOUS | Status: AC
  Administered 2014-05-19 – 2014-05-20 (×2): 2 g via INTRAVENOUS
  Filled 2014-05-19 (×2): qty 50

## 2014-05-19 MED ORDER — ACETAMINOPHEN 650 MG RE SUPP: 650.0000 mg | Freq: Four times a day (QID) | RECTAL | Status: DC | PRN

## 2014-05-19 MED ORDER — METOPROLOL TARTRATE 1 MG/ML IV SOLN
5.0000 mg | INTRAVENOUS | Status: DC | PRN
  Administered 2014-05-22: 5 mg via INTRAVENOUS
  Filled 2014-05-19: qty 5

## 2014-05-19 MED ORDER — HYDROGEN PEROXIDE 3 % EX SOLN
CUTANEOUS | Status: AC
  Filled 2014-05-19: qty 473

## 2014-05-19 MED ORDER — HYDROCODONE-ACETAMINOPHEN 5-325 MG PO TABS
1.0000 | ORAL_TABLET | Freq: Four times a day (QID) | ORAL | Status: DC | PRN
  Administered 2014-05-20 – 2014-05-24 (×8): 1 via ORAL
  Filled 2014-05-19 (×8): qty 1

## 2014-05-19 MED ORDER — MEPERIDINE HCL 50 MG/ML IJ SOLN: 6.2500 mg | INTRAMUSCULAR | Status: DC | PRN

## 2014-05-19 MED ORDER — HYDRALAZINE HCL 20 MG/ML IJ SOLN
3.0000 mg | INTRAMUSCULAR | Status: DC
  Administered 2014-05-19 (×2): 3 mg via INTRAVENOUS

## 2014-05-19 MED ORDER — KETOROLAC TROMETHAMINE 30 MG/ML IJ SOLN
INTRAMUSCULAR | Status: AC
  Filled 2014-05-19: qty 1

## 2014-05-19 MED ORDER — ACETAMINOPHEN 325 MG PO TABS
650.0000 mg | ORAL_TABLET | Freq: Four times a day (QID) | ORAL | Status: DC | PRN
  Administered 2014-05-22: 650 mg via ORAL
  Filled 2014-05-19: qty 2

## 2014-05-19 MED ORDER — ONDANSETRON HCL 4 MG/2ML IJ SOLN
INTRAMUSCULAR | Status: AC
  Filled 2014-05-19: qty 2

## 2014-05-19 MED ORDER — CISATRACURIUM BESYLATE 20 MG/10ML IV SOLN
INTRAVENOUS | Status: AC
  Filled 2014-05-19: qty 10

## 2014-05-19 SURGICAL SUPPLY — 42 items
BAG SPEC THK2 15X12 ZIP CLS (MISCELLANEOUS)
BAG ZIPLOCK 12X15 (MISCELLANEOUS) IMPLANT
BIT DRILL AO GAMMA 4.2X130 (BIT) ×3 IMPLANT
BIT DRILL AO GAMMA 4.2X180 (BIT) ×3 IMPLANT
CHLORAPREP W/TINT 26ML (MISCELLANEOUS) ×3 IMPLANT
COVER PERINEAL POST (MISCELLANEOUS) ×3 IMPLANT
DRAPE C-ARM 42X120 X-RAY (DRAPES) ×3 IMPLANT
DRAPE C-ARMOR (DRAPES) ×3 IMPLANT
DRAPE ORTHO SPLIT 77X108 STRL (DRAPES) ×4
DRAPE SHEET LG 3/4 BI-LAMINATE (DRAPES) IMPLANT
DRAPE STERI IOBAN 125X83 (DRAPES) ×3 IMPLANT
DRAPE SURG ORHT 6 SPLT 77X108 (DRAPES) ×2 IMPLANT
DRAPE U-SHAPE 47X51 STRL (DRAPES) ×6 IMPLANT
DRSG MEPILEX BORDER 4X4 (GAUZE/BANDAGES/DRESSINGS) ×9 IMPLANT
ELECT BLADE TIP CTD 4 INCH (ELECTRODE) ×3 IMPLANT
GAUZE SPONGE 4X4 12PLY STRL (GAUZE/BANDAGES/DRESSINGS) ×3 IMPLANT
GLOVE BIOGEL PI IND STRL 8.5 (GLOVE) ×1 IMPLANT
GLOVE BIOGEL PI INDICATOR 8.5 (GLOVE) ×2
GLOVE SURG ORTHO 8.5 STRL (GLOVE) ×6 IMPLANT
GOWN SPEC L3 XXLG W/TWL (GOWN DISPOSABLE) ×6 IMPLANT
GUIDEROD T2 3X1000 (ROD) ×3 IMPLANT
K-WIRE  3.2X450M STR (WIRE) ×2
K-WIRE 3.2X450M STR (WIRE) ×1
KIT BASIN OR (CUSTOM PROCEDURE TRAY) ×3 IMPLANT
KWIRE 3.2X450M STR (WIRE) ×1 IMPLANT
LIQUID BAND (GAUZE/BANDAGES/DRESSINGS) ×6 IMPLANT
MANIFOLD NEPTUNE II (INSTRUMENTS) ×3 IMPLANT
NAIL GAMMA 3  125DEG 11X380MM (Nail) ×3 IMPLANT
PACK TOTAL JOINT (CUSTOM PROCEDURE TRAY) ×3 IMPLANT
PEN SKIN MARKING BROAD (MISCELLANEOUS) ×3 IMPLANT
REAMER SHAFT BIXCUT (INSTRUMENTS) ×3 IMPLANT
SCREW LAG GAMMA 3 TI 10.5X85MM (Screw) ×3 IMPLANT
SCREW LOCKING T2 F/T  5MMX40MM (Screw) ×2 IMPLANT
SCREW LOCKING T2 F/T 5MMX40MM (Screw) ×1 IMPLANT
SUT MNCRL AB 3-0 PS2 18 (SUTURE) ×3 IMPLANT
SUT MON AB 2-0 CT1 27 (SUTURE) ×3 IMPLANT
SUT VIC AB 1 CT1 27 (SUTURE) ×2
SUT VIC AB 1 CT1 27XBRD ANTBC (SUTURE) ×1 IMPLANT
SUT VIC AB 2-0 CT1 27 (SUTURE) ×2
SUT VIC AB 2-0 CT1 27XBRD (SUTURE) ×1 IMPLANT
TUBE CLIP GAMMA CLOSED ×3 IMPLANT
YANKAUER SUCT BULB TIP NO VENT (SUCTIONS) ×3 IMPLANT

## 2014-05-19 NOTE — Plan of Care (Signed)
Problem: Phase I Progression Outcomes Goal: Pre op-initial discharge plan identified Outcome: Completed/Met Date Met:  05/19/14 Will be SNF at discharge [facility] after surgery 03/2014

## 2014-05-19 NOTE — H&P (View-Only) (Signed)
 ORTHOPAEDIC CONSULTATION  REQUESTING PHYSICIAN: Iskra M Myers, MD  PCP:  GATES,DONNA RUTH, MD  Chief Complaint: L IT femur fx, L prox humerus fracture  HPI: Kimberly Avery is a 81 y.o. female who is known to me for R femoral neck/ pertrochanteric femur fracture s/p IM nail on 04/21/14. Has been TDWB RLE. Tripped and fell at home yesterday, injuring L shoulder and L hip. Admitted to hospitalist.  Past Medical History  Diagnosis Date  . Hypertension   . Hyperlipidemia   . Gout   . Osteoporosis   . CKD (chronic kidney disease) stage 4, GFR 15-29 ml/min   . Glaucoma   . Vitamin D deficiency    Past Surgical History  Procedure Laterality Date  . Breast surgery Bilateral 1970    mastectomies - fiboradenoma  . Eye surgery  2003, 2004  . Temporal artery biopsy / ligation  2001  . Femur im nail Right 04/21/2014    Procedure: right proximal femur ORIF ;  Surgeon: Ival Pacer, MD;  Location: [facility];  Service: Orthopedics;  Laterality: Right;   History   Social History  . Marital Status: Widowed    Spouse Name: N/A  . Number of Children: N/A  . Years of Education: N/A   Social History Main Topics  . Smoking status: Never Smoker   . Smokeless tobacco: Never Used  . Alcohol Use: No  . Drug Use: No  . Sexual Activity: Not on file   Other Topics Concern  . None   Social History Narrative   Family History  Problem Relation Age of Onset  . Heart attack Mother     MI while in hospital, RA, HTN  . Stroke Father     MI, CAD, alcoholism  . Cancer Brother     prostate cancer  . Heart Problems Sister     pacemaker, DM, RA, HTN   No Known Allergies Prior to Admission medications   Medication Sig Start Date End Date Taking? Authorizing Provider  acetaminophen (TYLENOL) 325 MG tablet Take 2 tablets (650 mg total) by mouth every 4 (four) hours as needed for mild pain (temp > 101.5). 04/24/14  Yes Christina P Rama, MD  alendronate (FOSAMAX) 70 MG tablet Take 1 tablet by  mouth once a week. 08/31/13  Yes Historical Provider, MD  ALPRAZolam (XANAX) 0.5 MG tablet Take 0.5-1 tablets (0.25-0.5 mg total) by mouth at bedtime as needed for anxiety. 04/24/14  Yes Christina P Rama, MD  aspirin 81 MG tablet Take 81 mg by mouth daily.   Yes Historical Provider, MD  benazepril (LOTENSIN) 20 MG tablet Take 1 tablet (20 mg total) by mouth daily. 04/02/14  Yes Kenneth C Hilty, MD  docusate sodium (COLACE) 100 MG capsule Take 1 capsule (100 mg total) by mouth 2 (two) times daily. 04/24/14  Yes Christina P Rama, MD  dorzolamide-timolol (COSOPT) 22.3-6.8 MG/ML ophthalmic solution Place 1 drop into both eyes 2 (two) times daily. 09/19/13  Yes Historical Provider, MD  enoxaparin (LOVENOX) 30 MG/0.3ML injection Inject 0.3 mLs (30 mg total) into the skin daily. 04/24/14 05/22/14 Yes Christina P Rama, MD  metoprolol tartrate (LOPRESSOR) 12.5 mg TABS tablet Take 12.5 mg by mouth 2 (two) times daily.   Yes Historical Provider, MD  senna (SENOKOT) 8.6 MG TABS tablet Take 1 tablet (8.6 mg total) by mouth 2 (two) times daily. Patient taking differently: Take 1 tablet by mouth 2 (two) times daily as needed for mild constipation.  04/24/14  Yes Christina   P Rama, MD  simvastatin (ZOCOR) 20 MG tablet Take 1 tablet by mouth daily. 08/27/13  Yes Historical Provider, MD  traMADol (ULTRAM) 50 MG tablet Take 1 tablet (50 mg total) by mouth every 6 (six) hours as needed for moderate pain or severe pain. 04/24/14  Yes Christina P Rama, MD  Vitamin D, Ergocalciferol, (DRISDOL) 50000 UNITS CAPS capsule Take 1 capsule by mouth every 14 (fourteen) days. 09/17/13  Yes Historical Provider, MD  furosemide (LASIX) 20 MG tablet Take 1 tablet by mouth daily. 05/18/14   Historical Provider, MD   Dg Chest 1 View  05/18/2014   CLINICAL DATA:  Left shoulder pain after fall.  Initial encounter.  EXAM: CHEST  1 VIEW  COMPARISON:  04/17/2014  FINDINGS: Borderline cardiomegaly, similar to previous when accounting for differences in  technique. Aortic and hilar contours are stable.  There is no edema, consolidation, effusion, or pneumothorax.  No appreciable fracture. Scoliotic curvature of the lower thoracic and lumbar spine is stable.  IMPRESSION: No active disease.   Electronically Signed   By: Jonathon  Watts M.D.   On: 05/18/2014 14:18   Ct Head Wo Contrast  05/18/2014   CLINICAL DATA:  81-year-old hypertensive female complaining of left shoulder pain after fall this morning. Initial encounter.  EXAM: CT HEAD WITHOUT CONTRAST  CT CERVICAL SPINE WITHOUT CONTRAST  TECHNIQUE: Multidetector CT imaging of the head and cervical spine was performed following the standard protocol without intravenous contrast. Multiplanar CT image reconstructions of the cervical spine were also generated.  COMPARISON:  04/17/2014 head CT.  No comparison cervical spine CT.  FINDINGS: On the scout view, left humeral neck fracture is noted.  CT HEAD FINDINGS  No skull fracture or intracranial hemorrhage.  Small vessel disease type changes without CT evidence of large acute infarct.  Global atrophy without hydrocephalus.  No intracranial mass lesion noted on this unenhanced exam.  Vascular calcifications.  Orbital structures unremarkable.  Mastoid air cells, middle ear cavities and visualized paranasal sinuses are clear.  CT CERVICAL SPINE FINDINGS  No cervical spine fracture.  No abnormal prevertebral soft tissue swelling.  Degenerative changes cervical spine with segmental ossification posterior longitudinal ligament C4-5 level. Spinal stenosis and cord flattening most prominent C4-5 and C5-6. If ligamentous injury or cord injury were of high clinical concern, MR imaging could be obtained for further delineation.  T2 superior endplate Schmorl's node deformity/compression fracture of indeterminate age. Acute injury not excluded.  Scarring lung apices.  IMPRESSION: On the scout view, left humeral neck fracture is noted. Recommend follow-up plain film examination for  further delineation.  CT HEAD  No skull fracture or intracranial hemorrhage.  Small vessel disease type changes without CT evidence of large acute infarct.  Global atrophy without hydrocephalus.  CT CERVICAL SPINE  No cervical spine fracture.  Degenerative changes cervical spine with spine stenosis and cord flattening most prominent C4-5 and C5-6 level. If cord injury were of high clinical concern, MR imaging could be obtained for further delineation.  T2 superior endplate Schmorl's node deformity/compression fracture (20% loss of height) of indeterminate age. Acute injury not excluded.   Electronically Signed   By: Steven  Olson M.D.   On: 05/18/2014 14:17   Ct Cervical Spine Wo Contrast  05/18/2014   CLINICAL DATA:  81-year-old hypertensive female complaining of left shoulder pain after fall this morning. Initial encounter.  EXAM: CT HEAD WITHOUT CONTRAST  CT CERVICAL SPINE WITHOUT CONTRAST  TECHNIQUE: Multidetector CT imaging of the head and cervical spine   was performed following the standard protocol without intravenous contrast. Multiplanar CT image reconstructions of the cervical spine were also generated.  COMPARISON:  04/17/2014 head CT.  No comparison cervical spine CT.  FINDINGS: On the scout view, left humeral neck fracture is noted.  CT HEAD FINDINGS  No skull fracture or intracranial hemorrhage.  Small vessel disease type changes without CT evidence of large acute infarct.  Global atrophy without hydrocephalus.  No intracranial mass lesion noted on this unenhanced exam.  Vascular calcifications.  Orbital structures unremarkable.  Mastoid air cells, middle ear cavities and visualized paranasal sinuses are clear.  CT CERVICAL SPINE FINDINGS  No cervical spine fracture.  No abnormal prevertebral soft tissue swelling.  Degenerative changes cervical spine with segmental ossification posterior longitudinal ligament C4-5 level. Spinal stenosis and cord flattening most prominent C4-5 and C5-6. If ligamentous  injury or cord injury were of high clinical concern, MR imaging could be obtained for further delineation.  T2 superior endplate Schmorl's node deformity/compression fracture of indeterminate age. Acute injury not excluded.  Scarring lung apices.  IMPRESSION: On the scout view, left humeral neck fracture is noted. Recommend follow-up plain film examination for further delineation.  CT HEAD  No skull fracture or intracranial hemorrhage.  Small vessel disease type changes without CT evidence of large acute infarct.  Global atrophy without hydrocephalus.  CT CERVICAL SPINE  No cervical spine fracture.  Degenerative changes cervical spine with spine stenosis and cord flattening most prominent C4-5 and C5-6 level. If cord injury were of high clinical concern, MR imaging could be obtained for further delineation.  T2 superior endplate Schmorl's node deformity/compression fracture (20% loss of height) of indeterminate age. Acute injury not excluded.   Electronically Signed   By: Steven  Olson M.D.   On: 05/18/2014 14:17   Dg Shoulder Left  05/18/2014   CLINICAL DATA:  81-year-old female post fall with left shoulder pain. Initial encounter.  EXAM: LEFT SHOULDER - 2+ VIEW  COMPARISON:  None.  FINDINGS: Fracture of the left humeral surgical neck with slight rotation of the proximal humeral head/surgical neck with respect to the adjacent humeral shaft.  Calcified tortuous aorta.  IMPRESSION: Fracture of the left humeral surgical neck with slight rotation of the proximal humeral head/surgical neck with respect to the adjacent humeral shaft.   Electronically Signed   By: Steven  Olson M.D.   On: 05/18/2014 14:20   Dg Hip Unilat With Pelvis 2-3 Views Left  05/18/2014   CLINICAL DATA:  Pain following fall  EXAM: LEFT HIP (WITH PELVIS) 2-3 VIEWS  COMPARISON:  April 17, 2014  FINDINGS: Frontal pelvis as well as frontal and lateral left hip images were obtained. There is a comminuted fracture of the left intertrochanteric  region with varus angulation at the fracture site. There is avulsion of the lesser trochanter medially. On the right, there is postoperative change with screw and rod fixation through a recent intertrochanteric and subtrochanteric femur fracture with major fracture fragments in near anatomic alignment. No dislocations appreciable. There is moderate narrowing of both hip joints. There are small calcified uterine leiomyomas within the pelvis. There is degenerative change in the lower lumbar spine.  IMPRESSION: Comminuted intertrochanteric femur fracture on the left with varus angulation at the fracture site and avulsion of the lesser trochanter. Postoperative change on the right. Moderate narrowing both hip joints. No dislocation.   Electronically Signed   By: William  Woodruff III M.D.   On: 05/18/2014 14:18   Dg Femur Min 2 Views   Left  05/18/2014   CLINICAL DATA:  Left intertrochanteric fracture  EXAM: LEFT FEMUR 2 VIEWS  COMPARISON:  Pelvis and left hip images obtained earlier in the day  FINDINGS: Frontal and lateral views obtained. There is a comminuted intertrochanteric femur fracture on the left with varus angulation of the fracture site and medial avulsion of the lesser trochanter. More distally, no fracture. No dislocation. There is mild narrowing of the left hip joint.  IMPRESSION: Comminuted intertrochanteric femur fracture proximally. No fracture more distally. No dislocation. Mild narrowing left hip joint.   Electronically Signed   By: William  Woodruff III M.D.   On: 05/18/2014 17:28    Positive ROS: All other systems have been reviewed and were otherwise negative with the exception of those mentioned in the HPI and as above.  Physical Exam: General: Alert, no acute distress Cardiovascular: No pedal edema Respiratory: No cyanosis, no use of accessory musculature GI: No organomegaly, abdomen is soft and non-tender Skin: No lesions in the area of chief complaint Neurologic: Sensation intact  distally Psychiatric: Patient is competent for consent with normal mood and affect Lymphatic: No axillary or cervical lymphadenopathy  MUSCULOSKELETAL:  RUE: NTTP. No deformity. NVI. LUE: in sling. TTP proximal humerus. 2+ radial. + AIN/PIN/U. SILT. RLE incis healed x3. Can SLR. NVI LLE: shortened and externally rotated. NVI.  Assessment: 1. L IT femur fx 2. L proximal humerus fracture 3. S/p Im nail R femur on 3/30  Plan: Plan for OR today for IM nail L femur NPO for now Hold anticoags Discussed L prox humerus fracture with Dr. Supple who recommends nonop treatment in sling, NWB    Fabienne Nolasco James, MD Cell [phone]    05/19/2014 7:38 AM  

## 2014-05-19 NOTE — Addendum Note (Signed)
Addendum  created 05/19/14 1704 by Ronelle Nighharles Iden Stripling, MD   Modules edited: Clinical Notes   Clinical Notes:  File: [id number]

## 2014-05-19 NOTE — Clinical Social Work Placement (Signed)
   CLINICAL SOCIAL WORK PLACEMENT  NOTE  Date:  05/19/2014  Patient Details  Name: Mancel Parsonsrnestine E Peitz MRN: [id number] Date of Birth: October 25, 1923  Clinical Social Work is seeking post-discharge placement for this patient at the Skilled  Nursing Facility level of care (*CSW will initial, date and re-position this form in  chart as items are completed):  Yes   Patient/family provided with [facility] Clinical Social Work Department's list of facilities offering this level of care within the geographic area requested by the patient (or if unable, by the patient's family).  Yes   Patient/family informed of their freedom to choose among providers that offer the needed level of care, that participate in Medicare, Medicaid or managed care program needed by the patient, have an available bed and are willing to accept the patient.  Yes   Patient/family informed of [facility]'s ownership interest [facility] and [facility], as well as of the fact that they are under no obligation to receive care at these facilities.  PASRR submitted to EDS on       PASRR number received on       Existing PASRR number confirmed on 05/19/14     FL2 transmitted to all facilities in geographic area requested by pt/family on 05/19/14     FL2 transmitted to all facilities within larger geographic area on       Patient informed that his/her managed care company has contracts with or will negotiate with certain facilities, including the following:            Patient/family informed of bed offers received.  Patient chooses bed at       Physician recommends and patient chooses bed at      Patient to be transferred to   on  .  Patient to be transferred to facility by       Patient family notified on   of transfer.  Name of family member notified:        PHYSICIAN       Additional Comment:    _______________________________________________ Royetta AsalHaidinger, Timia Casselman Lee, LCSW 05/19/2014, 3:39 PM

## 2014-05-19 NOTE — Discharge Instructions (Signed)
Non weightbearing left upper extremity in sling. Touch down weight bearing right lower extremity. Touch down weight bearing left lower extremity --> Therefore she is bed to chair transfers only  May change LLE dressings PRN

## 2014-05-19 NOTE — Consult Note (Signed)
ORTHOPAEDIC CONSULTATION  REQUESTING PHYSICIAN: Dorothea Ogle, MD  PCP:  Hollice Espy, MD  Chief Complaint: L IT femur fx, L prox humerus fracture  HPI: Kimberly Avery is a 81 y.o. female who is known to me for R femoral neck/ pertrochanteric femur fracture s/p IM nail on 04/21/14. Has been TDWB RLE. Tripped and fell at home yesterday, injuring L shoulder and L hip. Admitted to hospitalist.  Past Medical History  Diagnosis Date  . Hypertension   . Hyperlipidemia   . Gout   . Osteoporosis   . CKD (chronic kidney disease) stage 4, GFR 15-29 ml/min   . Glaucoma   . Vitamin D deficiency    Past Surgical History  Procedure Laterality Date  . Breast surgery Bilateral 1970    mastectomies - fiboradenoma  . Eye surgery  2003, 2004  . Temporal artery biopsy / ligation  2001  . Femur im nail Right 04/21/2014    Procedure: right proximal femur ORIF ;  Surgeon: Samson Frederic, MD;  Location: [facility];  Service: Orthopedics;  Laterality: Right;   History   Social History  . Marital Status: Widowed    Spouse Name: N/A  . Number of Children: N/A  . Years of Education: N/A   Social History Main Topics  . Smoking status: Never Smoker   . Smokeless tobacco: Never Used  . Alcohol Use: No  . Drug Use: No  . Sexual Activity: Not on file   Other Topics Concern  . None   Social History Narrative   Family History  Problem Relation Age of Onset  . Heart attack Mother     MI while in hospital, RA, HTN  . Stroke Father     MI, CAD, alcoholism  . Cancer Brother     prostate cancer  . Heart Problems Sister     pacemaker, DM, RA, HTN   No Known Allergies Prior to Admission medications   Medication Sig Start Date End Date Taking? Authorizing Provider  acetaminophen (TYLENOL) 325 MG tablet Take 2 tablets (650 mg total) by mouth every 4 (four) hours as needed for mild pain (temp > 101.5). 04/24/14  Yes Christina P Rama, MD  alendronate (FOSAMAX) 70 MG tablet Take 1 tablet by  mouth once a week. 08/31/13  Yes Historical Provider, MD  ALPRAZolam Prudy Feeler) 0.5 MG tablet Take 0.5-1 tablets (0.25-0.5 mg total) by mouth at bedtime as needed for anxiety. 04/24/14  Yes Maryruth Bun Rama, MD  aspirin 81 MG tablet Take 81 mg by mouth daily.   Yes Historical Provider, MD  benazepril (LOTENSIN) 20 MG tablet Take 1 tablet (20 mg total) by mouth daily. 04/02/14  Yes Chrystie Nose, MD  docusate sodium (COLACE) 100 MG capsule Take 1 capsule (100 mg total) by mouth 2 (two) times daily. 04/24/14  Yes Christina P Rama, MD  dorzolamide-timolol (COSOPT) 22.3-6.8 MG/ML ophthalmic solution Place 1 drop into both eyes 2 (two) times daily. 09/19/13  Yes Historical Provider, MD  enoxaparin (LOVENOX) 30 MG/0.3ML injection Inject 0.3 mLs (30 mg total) into the skin daily. 04/24/14 05/22/14 Yes Christina P Rama, MD  metoprolol tartrate (LOPRESSOR) 12.5 mg TABS tablet Take 12.5 mg by mouth 2 (two) times daily.   Yes Historical Provider, MD  senna (SENOKOT) 8.6 MG TABS tablet Take 1 tablet (8.6 mg total) by mouth 2 (two) times daily. Patient taking differently: Take 1 tablet by mouth 2 (two) times daily as needed for mild constipation.  04/24/14  Yes Christina  P Rama, MD  simvastatin (ZOCOR) 20 MG tablet Take 1 tablet by mouth daily. 08/27/13  Yes Historical Provider, MD  traMADol (ULTRAM) 50 MG tablet Take 1 tablet (50 mg total) by mouth every 6 (six) hours as needed for moderate pain or severe pain. 04/24/14  Yes Christina P Rama, MD  Vitamin D, Ergocalciferol, (DRISDOL) 50000 UNITS CAPS capsule Take 1 capsule by mouth every 14 (fourteen) days. 09/17/13  Yes Historical Provider, MD  furosemide (LASIX) 20 MG tablet Take 1 tablet by mouth daily. 05/18/14   Historical Provider, MD   Dg Chest 1 View  05/18/2014   CLINICAL DATA:  Left shoulder pain after fall.  Initial encounter.  EXAM: CHEST  1 VIEW  COMPARISON:  04/17/2014  FINDINGS: Borderline cardiomegaly, similar to previous when accounting for differences in  technique. Aortic and hilar contours are stable.  There is no edema, consolidation, effusion, or pneumothorax.  No appreciable fracture. Scoliotic curvature of the lower thoracic and lumbar spine is stable.  IMPRESSION: No active disease.   Electronically Signed   By: Marnee Spring M.D.   On: 05/18/2014 14:18   Ct Head Wo Contrast  05/18/2014   CLINICAL DATA:  81 year old hypertensive female complaining of left shoulder pain after fall this morning. Initial encounter.  EXAM: CT HEAD WITHOUT CONTRAST  CT CERVICAL SPINE WITHOUT CONTRAST  TECHNIQUE: Multidetector CT imaging of the head and cervical spine was performed following the standard protocol without intravenous contrast. Multiplanar CT image reconstructions of the cervical spine were also generated.  COMPARISON:  04/17/2014 head CT.  No comparison cervical spine CT.  FINDINGS: On the scout view, left humeral neck fracture is noted.  CT HEAD FINDINGS  No skull fracture or intracranial hemorrhage.  Small vessel disease type changes without CT evidence of large acute infarct.  Global atrophy without hydrocephalus.  No intracranial mass lesion noted on this unenhanced exam.  Vascular calcifications.  Orbital structures unremarkable.  Mastoid air cells, middle ear cavities and visualized paranasal sinuses are clear.  CT CERVICAL SPINE FINDINGS  No cervical spine fracture.  No abnormal prevertebral soft tissue swelling.  Degenerative changes cervical spine with segmental ossification posterior longitudinal ligament C4-5 level. Spinal stenosis and cord flattening most prominent C4-5 and C5-6. If ligamentous injury or cord injury were of high clinical concern, MR imaging could be obtained for further delineation.  T2 superior endplate Schmorl's node deformity/compression fracture of indeterminate age. Acute injury not excluded.  Scarring lung apices.  IMPRESSION: On the scout view, left humeral neck fracture is noted. Recommend follow-up plain film examination for  further delineation.  CT HEAD  No skull fracture or intracranial hemorrhage.  Small vessel disease type changes without CT evidence of large acute infarct.  Global atrophy without hydrocephalus.  CT CERVICAL SPINE  No cervical spine fracture.  Degenerative changes cervical spine with spine stenosis and cord flattening most prominent C4-5 and C5-6 level. If cord injury were of high clinical concern, MR imaging could be obtained for further delineation.  T2 superior endplate Schmorl's node deformity/compression fracture (20% loss of height) of indeterminate age. Acute injury not excluded.   Electronically Signed   By: Lacy Duverney M.D.   On: 05/18/2014 14:17   Ct Cervical Spine Wo Contrast  05/18/2014   CLINICAL DATA:  81 year old hypertensive female complaining of left shoulder pain after fall this morning. Initial encounter.  EXAM: CT HEAD WITHOUT CONTRAST  CT CERVICAL SPINE WITHOUT CONTRAST  TECHNIQUE: Multidetector CT imaging of the head and cervical spine  was performed following the standard protocol without intravenous contrast. Multiplanar CT image reconstructions of the cervical spine were also generated.  COMPARISON:  04/17/2014 head CT.  No comparison cervical spine CT.  FINDINGS: On the scout view, left humeral neck fracture is noted.  CT HEAD FINDINGS  No skull fracture or intracranial hemorrhage.  Small vessel disease type changes without CT evidence of large acute infarct.  Global atrophy without hydrocephalus.  No intracranial mass lesion noted on this unenhanced exam.  Vascular calcifications.  Orbital structures unremarkable.  Mastoid air cells, middle ear cavities and visualized paranasal sinuses are clear.  CT CERVICAL SPINE FINDINGS  No cervical spine fracture.  No abnormal prevertebral soft tissue swelling.  Degenerative changes cervical spine with segmental ossification posterior longitudinal ligament C4-5 level. Spinal stenosis and cord flattening most prominent C4-5 and C5-6. If ligamentous  injury or cord injury were of high clinical concern, MR imaging could be obtained for further delineation.  T2 superior endplate Schmorl's node deformity/compression fracture of indeterminate age. Acute injury not excluded.  Scarring lung apices.  IMPRESSION: On the scout view, left humeral neck fracture is noted. Recommend follow-up plain film examination for further delineation.  CT HEAD  No skull fracture or intracranial hemorrhage.  Small vessel disease type changes without CT evidence of large acute infarct.  Global atrophy without hydrocephalus.  CT CERVICAL SPINE  No cervical spine fracture.  Degenerative changes cervical spine with spine stenosis and cord flattening most prominent C4-5 and C5-6 level. If cord injury were of high clinical concern, MR imaging could be obtained for further delineation.  T2 superior endplate Schmorl's node deformity/compression fracture (20% loss of height) of indeterminate age. Acute injury not excluded.   Electronically Signed   By: Lacy Duverney M.D.   On: 05/18/2014 14:17   Dg Shoulder Left  05/18/2014   CLINICAL DATA:  81 year old female post fall with left shoulder pain. Initial encounter.  EXAM: LEFT SHOULDER - 2+ VIEW  COMPARISON:  None.  FINDINGS: Fracture of the left humeral surgical neck with slight rotation of the proximal humeral head/surgical neck with respect to the adjacent humeral shaft.  Calcified tortuous aorta.  IMPRESSION: Fracture of the left humeral surgical neck with slight rotation of the proximal humeral head/surgical neck with respect to the adjacent humeral shaft.   Electronically Signed   By: Lacy Duverney M.D.   On: 05/18/2014 14:20   Dg Hip Unilat With Pelvis 2-3 Views Left  05/18/2014   CLINICAL DATA:  Pain following fall  EXAM: LEFT HIP (WITH PELVIS) 2-3 VIEWS  COMPARISON:  April 17, 2014  FINDINGS: Frontal pelvis as well as frontal and lateral left hip images were obtained. There is a comminuted fracture of the left intertrochanteric  region with varus angulation at the fracture site. There is avulsion of the lesser trochanter medially. On the right, there is postoperative change with screw and rod fixation through a recent intertrochanteric and subtrochanteric femur fracture with major fracture fragments in near anatomic alignment. No dislocations appreciable. There is moderate narrowing of both hip joints. There are small calcified uterine leiomyomas within the pelvis. There is degenerative change in the lower lumbar spine.  IMPRESSION: Comminuted intertrochanteric femur fracture on the left with varus angulation at the fracture site and avulsion of the lesser trochanter. Postoperative change on the right. Moderate narrowing both hip joints. No dislocation.   Electronically Signed   By: Bretta Bang III M.D.   On: 05/18/2014 14:18   Dg Femur Min 2 Views  Left  05/18/2014   CLINICAL DATA:  Left intertrochanteric fracture  EXAM: LEFT FEMUR 2 VIEWS  COMPARISON:  Pelvis and left hip images obtained earlier in the day  FINDINGS: Frontal and lateral views obtained. There is a comminuted intertrochanteric femur fracture on the left with varus angulation of the fracture site and medial avulsion of the lesser trochanter. More distally, no fracture. No dislocation. There is mild narrowing of the left hip joint.  IMPRESSION: Comminuted intertrochanteric femur fracture proximally. No fracture more distally. No dislocation. Mild narrowing left hip joint.   Electronically Signed   By: Bretta BangWilliam  Woodruff III M.D.   On: 05/18/2014 17:28    Positive ROS: All other systems have been reviewed and were otherwise negative with the exception of those mentioned in the HPI and as above.  Physical Exam: General: Alert, no acute distress Cardiovascular: No pedal edema Respiratory: No cyanosis, no use of accessory musculature GI: No organomegaly, abdomen is soft and non-tender Skin: No lesions in the area of chief complaint Neurologic: Sensation intact  distally Psychiatric: Patient is competent for consent with normal mood and affect Lymphatic: No axillary or cervical lymphadenopathy  MUSCULOSKELETAL:  RUE: NTTP. No deformity. NVI. LUE: in sling. TTP proximal humerus. 2+ radial. + AIN/PIN/U. SILT. RLE incis healed x3. Can SLR. NVI LLE: shortened and externally rotated. NVI.  Assessment: 1. L IT femur fx 2. L proximal humerus fracture 3. S/p Im nail R femur on 3/30  Plan: Plan for OR today for IM nail L femur NPO for now Hold anticoags Discussed L prox humerus fracture with Dr. Rennis ChrisSupple who recommends nonop treatment in sling, NWB    Asriel Westrup, Cloyde ReamsBrian James, MD Cell [phone]    05/19/2014 7:38 AM

## 2014-05-19 NOTE — Clinical Social Work Note (Signed)
Clinical Social Work Assessment  Patient Details  Name: Kimberly Avery MRN: [id number] Date of Birth: [birth year]  Date of referral:  05/19/14               Reason for consult:  Facility Placement, Discharge Planning                Permission sought to share information with:    Permission granted to share information::     Name::        Agency::     Relationship::     Contact Information:     Housing/Transportation Living arrangements for the past 2 months:  Single Family Home Source of Information:  Patient, Adult Children Patient Interpreter Needed:  None Criminal Activity/Legal Involvement Pertinent to Current Situation/Hospitalization:  No - Comment as needed Significant Relationships:  Adult Children Lives with:    Do you feel safe going back to the place where you live?   (SNF placement required.) Need for family participation in patient care:     Care giving concerns:  No concerns at this time.   Social Worker assessment / plan:  Pt is Avery 81 yr old female living at home prior to hospitalization. Pt was receiving rehab at [facility] from 04/24/14 - 05/12/14. Pt fell at home and fx her hip which required surgery. CSW met with pt / daughters to assist with d/c planning. Pt / family feel ST Rehab will be needed at d/c. SNF search initiated and bed offers are pending. CSW will continue to follow to assist with d/c planning to SNF.  Employment status:  Retired Forensic scientist:  [employer] PT Recommendations:  [place] / Referral to community resources:  [place]  Patient/Family's Response to care:  Pt / family are satisfied with care received at hospital.  Patient/Family's Understanding of and Emotional Response to Diagnosis, Current Treatment, and Prognosis:  Pt / family understand surgery is needed. Pt is disappointed she fell again and is back in the hospital. " I don't know what happened. Just like the last time. "    Emotional Assessment Appearance:  Appears stated age Attitude/Demeanor/Rapport:  Other (pleasant, cooperative) Affect (typically observed):  Accepting Orientation:  Oriented to Self, Oriented to Place, Oriented to  Time, Oriented to Situation Alcohol / Substance use:  Not Applicable Psych involvement (Current and /or in the community):  No (Comment)  Discharge Needs  Concerns to be addressed:  Discharge Planning Concerns Readmission within the last 30 days:  Yes Current discharge risk:  None Barriers to Discharge:  No Barriers Identified   Kimberly Avery, Kimberly An, LCSW 05/19/2014, 3:29 PM

## 2014-05-19 NOTE — Progress Notes (Addendum)
Patient ID: Kimberly Avery, female   DOB: Nov 19, 1923, 81 y.o.   MRN: [id number]  [facility] PROGRESS NOTE  Kimberly Avery:[id number] DOB: 27-Aug-1923 DOA: 05/18/2014 PCP: Hollice Espy, MD   Brief narrative:    81 y.o. female with essential hypertension, HLD, stage III chronic kidney disease, hospitalized 04/17/14-04/23/14 for sepsis secondary to pyelonephritis, severe hyponatremia (Na 106), acute blood loss anemia, elevated troponin from demand ischemia in the setting of severe illness, acute encephalopathy and intertrochanteric fracture of right hip for which she underwent medullary nail fixation, now presented with left shoulder and lower extremity pain following mechanical fall at home. She described pain as sharp and stabbing, intermittent and 10/10 in severity when present, non radiating, worse with even minimal movement with no specific alleviating factors.   In the ED, pt noted to be hemodynamically stable with VS notable for BP 92/47 and up to 205/83 mmHg, Cr 1.33, Hg 11.6. Imaging studies included CT head and cervical spine without acute findings, left shoulder x-ray with fracture of the left humeral surgical neck and left hip x-ray shows communited intertrochanteric femur fracture. Orthopedic surgeon/Dr. Linna Caprice was consulted by ED PA and [facility] asked to admit pt for further evaluation. Plan to take pt to OR 05/19/2014.  Major events since admission: 4/27 - taken to OR, developed post op a-fib with HR 110 - 120's, HTN-ive urgency with SBP in 180's, requiring transfer to SDU  Assessment/Plan:    Principal Problem:   Fracture, intertrochanteric, left femur, after an episode of mechanical fall - plan to take to OR today  - keep NPO until post op - provide analgesia as needed - will need PT/OT prior to discharge  Active Problems:   HTN-ive urgency post op and requiring transfer to SDU 4/27 - SBP in 180's this AM and post op as well  - place on hydralazine scheduled  and as needed   Post op atrial fibrillation with RVR - transfer to SDU post op - ask for 12 lead EKG, CXR - place on Metoprolol as needed for now and monitor VS closely  - may need to be placed on cardizem drip based on the rate control    Protein-calorie malnutrition, severe - in the context of progressive FTT and deconditioning - nutritionist will be consulted    CKD (chronic kidney disease), stage III - Cr is stable and at pt's baseline - will repeat BMP in AM - if Cr continues trending up, will consider holding off on Benazepril until renal function stabilizes  - continue to hold lasix    Fracture of left humerus - Dr. Rennis Chris recommends nonop treatment in sling, NWB - OT will be requested as well prior to discharge    Chronic diastolic CHF - last 2 D ECHO 03/2014 with normal EF but grade I diastolic CHF - pt is euvolemic on exam, takes lasix at home which has been on hold since admission due to elevated Cr  - weight is up from 110 to 115 lbs this AM so will need to hold off on IVF - monitor daily weights, strict I/O - may need to resume home regimen with lasix in AM - CXR requested    Cervical spinal stenosis, noted on CT cervical spine - most notable at C4-C5, C5-C6 - will monitor clinical status and may need to proceed with MRI of the cervical spine for closely evaluation if it becomes clinically indicated    Falls at home - likely secondary to progressive deconditioning - will  nee PT evaluation prior to discharge    Anemia of chronic disease, CKD, IDA - drop in Hg since admission likely from dilutional component from IVF pt has received - no signs of active bleeding - repeat CBC in AM  DVT prophylaxis - resume Lovenox SQ post op   Code Status: Full.  Family Communication:  plan of care discussed with the patient Disposition Plan: transfer to SDU post op as pt developed post op a-fib with HTN-ive urgency with SBP in 180's. Will need PT/OT once more medically stable and  able to participate.  IV access:  Peripheral IV  Procedures and diagnostic studies:    Dg Chest 1 View  05/18/2014  No active disease.     Ct Head Wo Contrast  05/18/2014   No skull fracture or intracranial hemorrhage.  Small vessel disease type changes without CT evidence of large acute infarct.  Global atrophy without hydrocephalus.    Ct Cervical Spine Wo Contrast  05/18/2014   No cervical spine fracture.  Degenerative changes cervical spine with spine stenosis and cord flattening most prominent C4-5 and C5-6 level. If cord injury were of high clinical concern, MR imaging could be obtained for further delineation.  T2 superior endplate Schmorl's node deformity/compression fracture (20% loss of height) of indeterminate age. Acute injury not excluded.     Dg Shoulder Left  05/18/2014  Fracture of the left humeral surgical neck with slight rotation of the proximal humeral head/surgical neck with respect to the adjacent humeral shaft.     Dg Hip Unilat With Pelvis 2-3 Views Left  05/18/2014  Comminuted intertrochanteric femur fracture on the left with varus angulation at the fracture site and avulsion of the lesser trochanter. Postoperative change on the right. Moderate narrowing both hip joints. No dislocation.    Dg Femur Min 2 Views Left  05/19/2014   Well-aligned fracture fragments following ORIF.     Dg Femur Min 2 Views Left  05/18/2014  Comminuted intertrochanteric femur fracture proximally. No dislocation.   Medical Consultants:  Ortho  Other Consultants:  PT/OT Nutritionist   IAnti-Infectives:   None   Debbora Presto, MD  [facility] Pager [phone]  If 7PM-7AM, please contact night-coverage [email or web address] Password [facility] 05/19/2014, 3:32 PM   LOS: 1 day   HPI/Subjective: Still with pain in the left arm and left hip area, 3/10 after pain medication.   Objective: Filed Vitals:   05/19/14 0500 05/19/14 1021 05/19/14 1458 05/19/14 1500  BP:  116/78 185/66 174/67  Pulse:  80 59  59  Temp:  98.2 F (36.8 C)  98 F (36.7 C)  TempSrc:  Oral    Resp:  Height:      Weight: 52.2 kg (115 lb 1.3 oz)     SpO2:  98% 99% 100%    Intake/Output Summary (Last 24 hours) at 05/19/14 1532 Last data filed at 05/19/14 1436  Gross per 24 hour  Intake 1173.33 ml  Output   1300 ml  Net -126.67 ml    Exam:   General:  Pt is alert, follows commands appropriately, not in acute distress  Cardiovascular: RRR, S1/S2, no rubs, no gallops (please note that post op RN on the floor reported a-fib with HR in 100's)  Respiratory: Clear to auscultation bilaterally, no wheezing, diminished breath sounds at bases   Abdomen: Soft, non tender, non distended, bowel sounds present, no guarding  Extremities: TTP in the left hip area, pulses DP and PT palpable bilaterally  Neuro:  Grossly nonfocal  Data Reviewed: Basic Metabolic Panel:  Recent Labs Lab 05/18/14 1235 05/19/14 0435  NA 142 143  K 4.2 4.2  CL 111 113*  CO2 25 23  GLUCOSE 140* 134*  BUN 17 18  CREATININE 1.33* 1.32*  CALCIUM 8.4 8.2*   CBC:  Recent Labs Lab 05/18/14 1235 05/19/14 0435  WBC 7.5 8.1  NEUTROABS 6.2  --   HGB 11.6* 9.4*  HCT 36.3 28.8*  MCV 94.0 92.9  PLT 234 194    Recent Results (from the past 240 hour(s))  Surgical pcr screen     Status: None   Collection Time: 05/18/14 10:05 PM  Result Value Ref Range Status   MRSA, PCR NEGATIVE NEGATIVE Final   Staphylococcus aureus NEGATIVE NEGATIVE Final     Scheduled Meds: . [MAR Hold] benazepril  20 mg Oral Daily  . [MAR Hold] docusate sodium  100 mg Oral BID  . [MAR Hold] dorzolamide-timolol  1 drop Both Eyes BID  . fentaNYL      . [MAR Hold] metoprolol tartrate  12.5 mg Oral BID  . [MAR Hold] simvastatin  20 mg Oral q1800   Continuous Infusions: . sodium chloride 50 mL/hr at 05/19/14 0520  . lactated ringers 1,000 mL (05/19/14 1229)

## 2014-05-19 NOTE — Anesthesia Preprocedure Evaluation (Addendum)
Anesthesia Evaluation  Patient identified by MRN, date of birth, ID band  Reviewed: Allergy & Precautions, NPO status , Patient's Chart, lab work & pertinent test results  Airway Mallampati: II  TM Distance: >3 FB Neck ROM: Full    Dental no notable dental hx. (+) Dental Advisory Given   Pulmonary neg pulmonary ROS,  breath sounds clear to auscultation  Pulmonary exam normal       Cardiovascular hypertension, Pt. on medications and Pt. on home beta blockers Rhythm:Regular Rate:Normal  EF 55%.   Neuro/Psych delerium    GI/Hepatic negative GI ROS, Neg liver ROS,   Endo/Other  Hyponatremia- 132 this AM  Renal/GU ARFRenal disease     Musculoskeletal negative musculoskeletal ROS (+)   Abdominal   Peds  Hematology  (+) anemia , Hgb 8.6   Anesthesia Other Findings   Reproductive/Obstetrics                            Anesthesia Physical  Anesthesia Plan  ASA: III  Anesthesia Plan: General   Post-op Pain Management:    Induction: Intravenous  Airway Management Planned: Oral ETT  Additional Equipment:   Intra-op Plan:   Post-operative Plan: Extubation in OR  Informed Consent: I have reviewed the patients History and Physical, chart, labs and discussed the procedure including the risks, benefits and alternatives for the proposed anesthesia with the patient or authorized representative who has indicated his/her understanding and acceptance.   Dental advisory given  Plan Discussed with: CRNA  Anesthesia Plan Comments:         Anesthesia Quick Evaluation

## 2014-05-19 NOTE — Progress Notes (Signed)
Patient in atrial fibrillation with well controlled rate in PACU.  Since no history of that is recorded, I have asked that Dr. Leroy LibmanSwintech consult internal medicine about this.    Chaya Dehaan MD

## 2014-05-19 NOTE — Anesthesia Postprocedure Evaluation (Signed)
  Anesthesia Post-op Note  Patient: Kimberly Avery  Procedure(s) Performed: Procedure(s) (LRB): IM NAIL LEFT HIP (Left)  Patient Location: PACU  Anesthesia Type: General  Level of Consciousness: awake and alert   Airway and Oxygen Therapy: Patient Spontanous Breathing  Post-op Pain: mild  Post-op Assessment: Post-op Vital signs reviewed, Patient's Cardiovascular Status Stable, Respiratory Function Stable, Patent Airway and No signs of Nausea or vomiting  Last Vitals:  Filed Vitals:   05/19/14 1500  BP: 174/67  Pulse: 59  Temp: 36.7 C  Resp: 18    Post-op Vital Signs: stable   Complications: No apparent anesthesia complications

## 2014-05-19 NOTE — Transfer of Care (Signed)
Immediate Anesthesia Transfer of Care Note  Patient: Kimberly Avery  Procedure(s) Performed: Procedure(s): IM NAIL LEFT HIP (Left)  Patient Location: PACU  Anesthesia Type:General  Level of Consciousness:  sedated, patient cooperative and responds to stimulation  Airway & Oxygen Therapy:Patient Spontanous Breathing and Patient connected to face mask oxgen  Post-op Assessment:  Report given to PACU RN and Post -op Vital signs reviewed and stable  Post vital signs:  Reviewed and stable  Last Vitals:  Filed Vitals:   05/19/14 1021  BP: 116/78  Pulse: 80  Temp: 36.8 C  Resp: 16    Complications: No apparent anesthesia complications

## 2014-05-19 NOTE — Brief Op Note (Signed)
05/18/2014 - 05/19/2014  2:49 PM  PATIENT:  Kimberly Avery  81 y.o. female  PRE-OPERATIVE DIAGNOSIS:  LEFT INTERTROCHANTIC FEMUR  FRACTURE  POST-OPERATIVE DIAGNOSIS:  LEFT INTERTROCHANTIC FEMUR  FRACTURE  PROCEDURE:  Procedure(s): IM NAIL LEFT HIP (Left)  SURGEON:  Surgeon(s) and Role:    * Samson FredericBrian Jameela Michna, MD - Primary  PHYSICIAN ASSISTANT: none  ASSISTANTS: none   ANESTHESIA:   general  EBL:  Total I/O In: 1000 [I.V.:1000] Out: 700 [Urine:600; Blood:100]  BLOOD ADMINISTERED:none  DRAINS: none   LOCAL MEDICATIONS USED:  NONE  SPECIMEN:  No Specimen  DISPOSITION OF SPECIMEN:  N/A  COUNTS:  YES  TOURNIQUET:  * No tourniquets in log *  DICTATION: .Other Dictation: Dictation Number [id number]  PLAN OF CARE: Admit to inpatient   PATIENT DISPOSITION:  PACU - hemodynamically stable.   Delay start of Pharmacological VTE agent (>24hrs) due to surgical blood loss or risk of bleeding: not applicable

## 2014-05-19 NOTE — Progress Notes (Signed)
Pt admitted from OR during previous shift with new onset A.Fib. Pt has now started to have longer periods of RVR lasting as long as 10 minutes. 2200 metoprolol dose given and MD notified. MD ordered extra 12.5 dose of metoprolol. Will continue to monitor.

## 2014-05-19 NOTE — Anesthesia Procedure Notes (Signed)
Procedure Name: Intubation Date/Time: 05/19/2014 12:51 PM Performed by: Thornell MuleSTUBBLEFIELD, Carter Kassel G Pre-anesthesia Checklist: Patient identified, Emergency Drugs available, Suction available and Patient being monitored Patient Re-evaluated:Patient Re-evaluated prior to inductionOxygen Delivery Method: Circle System Utilized Preoxygenation: Pre-oxygenation with 100% oxygen Intubation Type: IV induction Ventilation: Mask ventilation without difficulty Laryngoscope Size: Miller and 2 Grade View: Grade I Tube type: Oral Tube size: 7.0 mm Number of attempts: 1 Airway Equipment and Method: Stylet and Oral airway Placement Confirmation: ETT inserted through vocal cords under direct vision,  positive ETCO2 and breath sounds checked- equal and bilateral Secured at: 20 cm Tube secured with: Tape Dental Injury: Teeth and Oropharynx as per pre-operative assessment

## 2014-05-19 NOTE — Interval H&P Note (Signed)
History and Physical Interval Note:  05/19/2014 12:29 PM  Kimberly Avery  has presented today for surgery, with the diagnosis of LEFT INTERTROCHANTIC FEMUR  FRACTURE  The various methods of treatment have been discussed with the patient and family. After consideration of risks, benefits and other options for treatment, the patient has consented to  Procedure(s): IM NAIL LEFT HIP (Left) as a surgical intervention .  The patient's history has been reviewed, patient examined, no change in status, stable for surgery.  I have reviewed the patient's chart and labs.  Questions were answered to the patient's satisfaction.    The risks, benefits, and alternatives were discussed with the patient and her daughters. There are risks associated with the surgery including, but not limited to, problems with anesthesia (death), infection, differences in leg length/angulation/rotation, fracture of bones, failure of implants, hematoma (blood accumulation) which may require surgical drainage, blood clots, pulmonary embolism, nerve injury (foot drop), and blood vessel injury. They understand these risks and elect to proceed.    Kimberly Avery, Kimberly Avery

## 2014-05-19 NOTE — Plan of Care (Signed)
Problem: Phase I Progression Outcomes Goal: Pre op Protime within normal limits Outcome: Completed/Met Date Met:  05/19/14 INR 1.03   WNL

## 2014-05-20 ENCOUNTER — Encounter (HOSPITAL_COMMUNITY): Payer: Self-pay | Admitting: Orthopedic Surgery

## 2014-05-20 ENCOUNTER — Inpatient Hospital Stay (HOSPITAL_COMMUNITY): Payer: Medicare Other

## 2014-05-20 DIAGNOSIS — I471 Supraventricular tachycardia: Secondary | ICD-10-CM

## 2014-05-20 LAB — URINE MICROSCOPIC-ADD ON

## 2014-05-20 LAB — BASIC METABOLIC PANEL
ANION GAP: 7 (ref 5–15)
BUN: 22 mg/dL (ref 6–23)
CHLORIDE: 111 mmol/L (ref 96–112)
CO2: 23 mmol/L (ref 19–32)
Calcium: 7.7 mg/dL — ABNORMAL LOW (ref 8.4–10.5)
Creatinine, Ser: 1.44 mg/dL — ABNORMAL HIGH (ref 0.50–1.10)
GFR calc Af Amer: 36 mL/min — ABNORMAL LOW (ref >90)
GFR calc non Af Amer: 31 mL/min — ABNORMAL LOW (ref >90)
Glucose, Bld: 131 mg/dL — ABNORMAL HIGH (ref 70–99)
POTASSIUM: 3.9 mmol/L (ref 3.5–5.1)
SODIUM: 141 mmol/L (ref 135–145)

## 2014-05-20 LAB — PROTIME-INR
INR: 1.29 (ref 0.00–1.49)
Prothrombin Time: 16.3 seconds — ABNORMAL HIGH (ref 11.6–15.2)

## 2014-05-20 LAB — URINALYSIS, ROUTINE W REFLEX MICROSCOPIC
BILIRUBIN URINE: NEGATIVE
Glucose, UA: NEGATIVE mg/dL
Hgb urine dipstick: NEGATIVE
Ketones, ur: NEGATIVE mg/dL
NITRITE: NEGATIVE
PH: 5.5 (ref 5.0–8.0)
Protein, ur: NEGATIVE mg/dL
SPECIFIC GRAVITY, URINE: 1.018 (ref 1.005–1.030)
Urobilinogen, UA: 0.2 mg/dL (ref 0.0–1.0)

## 2014-05-20 LAB — CBC
HCT: 25.9 % — ABNORMAL LOW (ref 36.0–46.0)
Hemoglobin: 8.5 g/dL — ABNORMAL LOW (ref 12.0–15.0)
MCH: 30.5 pg (ref 26.0–34.0)
MCHC: 32.8 g/dL (ref 30.0–36.0)
MCV: 92.8 fL (ref 78.0–100.0)
PLATELETS: 176 10*3/uL (ref 150–400)
RBC: 2.79 MIL/uL — ABNORMAL LOW (ref 3.87–5.11)
RDW: 15.4 % (ref 11.5–15.5)
WBC: 11.4 10*3/uL — ABNORMAL HIGH (ref 4.0–10.5)

## 2014-05-20 LAB — PROCALCITONIN: Procalcitonin: 0.76 ng/mL

## 2014-05-20 LAB — APTT: APTT: 46 s — AB (ref 24–37)

## 2014-05-20 LAB — LACTIC ACID, PLASMA
Lactic Acid, Venous: 1.7 mmol/L (ref 0.5–2.0)
Lactic Acid, Venous: 2.9 mmol/L (ref 0.5–2.0)

## 2014-05-20 MED ORDER — PIPERACILLIN-TAZOBACTAM 3.375 G IVPB
3.3750 g | Freq: Three times a day (TID) | INTRAVENOUS | Status: DC
  Administered 2014-05-20 – 2014-05-21 (×2): 3.375 g via INTRAVENOUS
  Filled 2014-05-20 (×2): qty 50

## 2014-05-20 MED ORDER — PIPERACILLIN-TAZOBACTAM 3.375 G IVPB 30 MIN
3.3750 g | INTRAVENOUS | Status: AC
  Administered 2014-05-20: 3.375 g via INTRAVENOUS
  Filled 2014-05-20: qty 50

## 2014-05-20 MED ORDER — BOOST / RESOURCE BREEZE PO LIQD
1.0000 | Freq: Three times a day (TID) | ORAL | Status: DC
  Administered 2014-05-20 (×2): 1 via ORAL
  Administered 2014-05-21: 10:00:00 via ORAL
  Administered 2014-05-21 – 2014-05-24 (×7): 1 via ORAL

## 2014-05-20 MED ORDER — VANCOMYCIN HCL IN DEXTROSE 1-5 GM/200ML-% IV SOLN
1000.0000 mg | INTRAVENOUS | Status: AC
  Administered 2014-05-20: 1000 mg via INTRAVENOUS
  Filled 2014-05-20: qty 200

## 2014-05-20 MED ORDER — VANCOMYCIN HCL 500 MG IV SOLR
500.0000 mg | INTRAVENOUS | Status: DC
  Administered 2014-05-21 – 2014-05-22 (×2): 500 mg via INTRAVENOUS
  Filled 2014-05-20 (×2): qty 500

## 2014-05-20 NOTE — Progress Notes (Signed)
CSW continuing to follow for disposition planning.   CSW received notification from nurse tech that pt daughter requesting to speak with CSW and requesting information regarding applying for Medicaid.   CSW met with pt daughter, Vaughan Basta at bedside. CSW introduced self as pt is a new transfer down to step down unit.   CSW provided support as pt daughter discussed that she toured SNF facilities and made a decision for [facility] for pt placement.   CSW discussed with pt daughter regarding applying for Medicaid and provided pt daughter with information regarding medicaid and provided pt daughter with a Medicaid application. CSW discussed with pt daughter that [employer] Education officer, museum will be able to assist with the process as well. Pt daughter appreciative of information.   CSW contacted [facility] and notified facility of acceptance of bed offer.   CSW to continue to follow to provide support and assist with pt discharge planning needs.   Alison Murray, MSW, [place] [employer] [phone]

## 2014-05-20 NOTE — Consult Note (Addendum)
Patient ID: Kimberly Avery MRN: [id number], DOB/AGE: 09-29-1923   Admit date: 05/18/2014   Primary Physician: Hollice EspyGATES,DONNA RUTH, MD Primary Cardiologist: Dr. Rennis GoldenHilty  Pt. Profile:  81 y/o female with h/o HTN and labile BP resulting in orthostatic hypotension, HLD and CKD stage 4, admitted for left hip fracture after a mechanical fall. S/p hip surgery. Post-op recovery complicated by tachy atrial arrhthymias.   Problem List  Past Medical History  Diagnosis Date  . Hypertension   . Hyperlipidemia   . Gout   . Osteoporosis   . CKD (chronic kidney disease) stage 4, GFR 15-29 ml/min   . Glaucoma   . Vitamin D deficiency     Past Surgical History  Procedure Laterality Date  . Breast surgery Bilateral 1970    mastectomies - fiboradenoma  . Eye surgery  2003, 2004  . Temporal artery biopsy / ligation  2001  . Femur im nail Right 04/21/2014    Procedure: right proximal femur ORIF ;  Surgeon: Samson FredericBrian Swinteck, MD;  Location: [facility];  Service: Orthopedics;  Laterality: Right;  . Femur im nail Left 05/19/2014    Procedure: IM NAIL LEFT HIP;  Surgeon: Samson FredericBrian Swinteck, MD;  Location: [facility];  Service: Orthopedics;  Laterality: Left;     Allergies  No Known Allergies  HPI The patient is a 81 y/o female, followed by Dr. Rennis GoldenHilty. She has a history of HTN, HLD, CKD stage 4. She has had difficulty in the past with labile hypertension with blood pressures that vary from significantly elevated to very low causing orthostatic hypotension and prior syncope. For this reason, her antihypretensives were adjusted and decision was made to keep her BP on the higher side to prevent further recurrence. She has also been noted in the past to have PACs and short burst of PSVT. However, this improved after initiation of BB therapy with metoprolol. She has a h/o normal LVF, Grade 1DD, mild AI and MR and moderate TR. Most recent 2D echo 03/2014 demonstrated an EF of 55-60%.   [facility]  for left hip fracture after a mechanical fall. She underwent surgical repair earlier today. During post-op recovery, patient developed an arrthymia. Tracings were apparently concerning atrial fibrillation. Cardiology has been consulted for recommendations.  Review of telemetry shows short runs of SVT and frequent PACs. Max rate was in the 150s. EKGs from today appear to show frequent PACs but normal HR. She is currently in NSR on telemetry. She notes her HR increases and she feels palpitations whenever she is in pain. Her pain is now controlled with PRN meds. She denies any other symptoms. BP is a bit soft in the low 90s systolic. She is asymptomatic. HR currently in the 70s. She is on PO metoprolol.   Home Medications  Prior to Admission medications   Medication Sig Start Date End Date Taking? Authorizing Provider  acetaminophen (TYLENOL) 325 MG tablet Take 2 tablets (650 mg total) by mouth every 4 (four) hours as needed for mild pain (temp > 101.5). 04/24/14  Yes Christina P Rama, MD  alendronate (FOSAMAX) 70 MG tablet Take 1 tablet by mouth once a week. 08/31/13  Yes Historical Provider, MD  ALPRAZolam Prudy Feeler(XANAX) 0.5 MG tablet Take 0.5-1 tablets (0.25-0.5 mg total) by mouth at bedtime as needed for anxiety. 04/24/14  Yes Maryruth Bunhristina P Rama, MD  aspirin 81 MG tablet Take 81 mg by mouth daily.   Yes Historical Provider, MD  benazepril (LOTENSIN) 20 MG tablet  Take 1 tablet (20 mg total) by mouth daily. 04/02/14  Yes Chrystie Nose, MD  docusate sodium (COLACE) 100 MG capsule Take 1 capsule (100 mg total) by mouth 2 (two) times daily. 04/24/14  Yes Christina P Rama, MD  dorzolamide-timolol (COSOPT) 22.3-6.8 MG/ML ophthalmic solution Place 1 drop into both eyes 2 (two) times daily. 09/19/13  Yes Historical Provider, MD  enoxaparin (LOVENOX) 30 MG/0.3ML injection Inject 0.3 mLs (30 mg total) into the skin daily. 04/24/14 05/22/14 Yes Christina P Rama, MD  metoprolol tartrate (LOPRESSOR) 12.5 mg TABS tablet Take 12.5 mg  by mouth 2 (two) times daily.   Yes Historical Provider, MD  senna (SENOKOT) 8.6 MG TABS tablet Take 1 tablet (8.6 mg total) by mouth 2 (two) times daily. Patient taking differently: Take 1 tablet by mouth 2 (two) times daily as needed for mild constipation.  04/24/14  Yes Christina P Rama, MD  simvastatin (ZOCOR) 20 MG tablet Take 1 tablet by mouth daily. 08/27/13  Yes Historical Provider, MD  traMADol (ULTRAM) 50 MG tablet Take 1 tablet (50 mg total) by mouth every 6 (six) hours as needed for moderate pain or severe pain. 04/24/14  Yes Christina P Rama, MD  Vitamin D, Ergocalciferol, (DRISDOL) 50000 UNITS CAPS capsule Take 1 capsule by mouth every 14 (fourteen) days. 09/17/13  Yes Historical Provider, MD  furosemide (LASIX) 20 MG tablet Take 1 tablet by mouth daily. 05/18/14   Historical Provider, MD    Family History  Family History  Problem Relation Age of Onset  . Heart attack Mother     MI while in hospital, RA, HTN  . Stroke Father     MI, CAD, alcoholism  . Cancer Brother     prostate cancer  . Heart Problems Sister     pacemaker, DM, RA, HTN    Social History  History   Social History  . Marital Status: Widowed    Spouse Name: N/A  . Number of Children: N/A  . Years of Education: N/A   Occupational History  . Not on file.   Social History Main Topics  . Smoking status: Never Smoker   . Smokeless tobacco: Never Used  . Alcohol Use: No  . Drug Use: No  . Sexual Activity: Not on file   Other Topics Concern  . Not on file   Social History Narrative     Review of Systems General:  No chills, fever, night sweats or weight changes.  Cardiovascular:  No chest pain, dyspnea on exertion, edema, orthopnea, palpitations, paroxysmal nocturnal dyspnea. Dermatological: No rash, lesions/masses Respiratory: No cough, dyspnea Urologic: No hematuria, dysuria Abdominal:   No nausea, vomiting, diarrhea, bright red blood per rectum, melena, or hematemesis Neurologic:  No visual  changes, wkns, changes in mental status. All other systems reviewed and are otherwise negative except as noted above.  Physical Exam  Blood pressure 122/67, pulse 70, temperature 99.5 F (37.5 C), temperature source Core (Comment), resp. rate 17, height 5\' 8"  (1.727 m), weight 110 lb 14.3 oz (50.3 kg), SpO2 100 %.  General: Pleasant, NAD Psych: Normal affect. Neuro: Alert and oriented X 3. Moves all extremities spontaneously. HEENT: Normal  Neck: Supple without bruits or JVD. Lungs:  Resp regular and unlabored, CTA. Heart: RRR with occasional PACs no s3, s4, or murmurs. Abdomen: Soft, non-tender, non-distended, BS + x 4.  Extremities: No clubbing, cyanosis or edema. DP/PT/Radials 2+ and equal bilaterally.  Labs  Troponin (Point of Care Test) No results for input(s): TROPIPOC  in the last 72 hours. No results for input(s): CKTOTAL, CKMB, TROPONINI in the last 72 hours. Lab Results  Component Value Date   WBC 11.4* 05/20/2014   HGB 8.5* 05/20/2014   HCT 25.9* 05/20/2014   MCV 92.8 05/20/2014   PLT 176 05/20/2014    Recent Labs Lab 05/20/14 0325  NA 141  K 3.9  CL 111  CO2 23  BUN 22  CREATININE 1.44*  CALCIUM 7.7*  GLUCOSE 131*   No results found for: CHOL, HDL, LDLCALC, TRIG No results found for: DDIMER   Radiology/Studies  Dg Chest 1 View  05/19/2014   CLINICAL DATA:  Acute onset atrial fibrillation. Postop from hip surgery.  EXAM: CHEST  1 VIEW  COMPARISON:  05/18/2014  FINDINGS: Pulmonary hyperinflation again seen, consistent with COPD. Borderline cardiomegaly remains stable. No evidence of pulmonary infiltrate or edema. No evidence of pneumothorax or pleural effusion. Thoracic spine degenerative changes and dextroscoliosis again noted.  In addition, there is a nondisplaced left humeral neck fracture which shows incomplete healing and is of indeterminate age radiographically. This was not well seen on recent chest radiographs.  IMPRESSION: Stable COPD and borderline  cardiomegaly.  No active lung disease.  Acute versus subacute left humeral neck fracture. Recommend dedicated left shoulder radiographs for further evaluation.   Electronically Signed   By: Myles Rosenthal M.D.   On: 05/19/2014 18:40   Dg Chest 1 View  05/18/2014   CLINICAL DATA:  Left shoulder pain after fall.  Initial encounter.  EXAM: CHEST  1 VIEW  COMPARISON:  04/17/2014  FINDINGS: Borderline cardiomegaly, similar to previous when accounting for differences in technique. Aortic and hilar contours are stable.  There is no edema, consolidation, effusion, or pneumothorax.  No appreciable fracture. Scoliotic curvature of the lower thoracic and lumbar spine is stable.  IMPRESSION: No active disease.   Electronically Signed   By: Marnee Spring M.D.   On: 05/18/2014 14:18   Ct Head Wo Contrast  05/18/2014   CLINICAL DATA:  81 year old hypertensive female complaining of left shoulder pain after fall this morning. Initial encounter.  EXAM: CT HEAD WITHOUT CONTRAST  CT CERVICAL SPINE WITHOUT CONTRAST  TECHNIQUE: Multidetector CT imaging of the head and cervical spine was performed following the standard protocol without intravenous contrast. Multiplanar CT image reconstructions of the cervical spine were also generated.  COMPARISON:  04/17/2014 head CT.  No comparison cervical spine CT.  FINDINGS: On the scout view, left humeral neck fracture is noted.  CT HEAD FINDINGS  No skull fracture or intracranial hemorrhage.  Small vessel disease type changes without CT evidence of large acute infarct.  Global atrophy without hydrocephalus.  No intracranial mass lesion noted on this unenhanced exam.  Vascular calcifications.  Orbital structures unremarkable.  Mastoid air cells, middle ear cavities and visualized paranasal sinuses are clear.  CT CERVICAL SPINE FINDINGS  No cervical spine fracture.  No abnormal prevertebral soft tissue swelling.  Degenerative changes cervical spine with segmental ossification posterior  longitudinal ligament C4-5 level. Spinal stenosis and cord flattening most prominent C4-5 and C5-6. If ligamentous injury or cord injury were of high clinical concern, MR imaging could be obtained for further delineation.  T2 superior endplate Schmorl's node deformity/compression fracture of indeterminate age. Acute injury not excluded.  Scarring lung apices.  IMPRESSION: On the scout view, left humeral neck fracture is noted. Recommend follow-up plain film examination for further delineation.  CT HEAD  No skull fracture or intracranial hemorrhage.  Small vessel disease type  changes without CT evidence of large acute infarct.  Global atrophy without hydrocephalus.  CT CERVICAL SPINE  No cervical spine fracture.  Degenerative changes cervical spine with spine stenosis and cord flattening most prominent C4-5 and C5-6 level. If cord injury were of high clinical concern, MR imaging could be obtained for further delineation.  T2 superior endplate Schmorl's node deformity/compression fracture (20% loss of height) of indeterminate age. Acute injury not excluded.   Electronically Signed   By: Lacy Duverney M.D.   On: 05/18/2014 14:17   Ct Cervical Spine Wo Contrast  05/18/2014   CLINICAL DATA:  81 year old hypertensive female complaining of left shoulder pain after fall this morning. Initial encounter.  EXAM: CT HEAD WITHOUT CONTRAST  CT CERVICAL SPINE WITHOUT CONTRAST  TECHNIQUE: Multidetector CT imaging of the head and cervical spine was performed following the standard protocol without intravenous contrast. Multiplanar CT image reconstructions of the cervical spine were also generated.  COMPARISON:  04/17/2014 head CT.  No comparison cervical spine CT.  FINDINGS: On the scout view, left humeral neck fracture is noted.  CT HEAD FINDINGS  No skull fracture or intracranial hemorrhage.  Small vessel disease type changes without CT evidence of large acute infarct.  Global atrophy without hydrocephalus.  No intracranial  mass lesion noted on this unenhanced exam.  Vascular calcifications.  Orbital structures unremarkable.  Mastoid air cells, middle ear cavities and visualized paranasal sinuses are clear.  CT CERVICAL SPINE FINDINGS  No cervical spine fracture.  No abnormal prevertebral soft tissue swelling.  Degenerative changes cervical spine with segmental ossification posterior longitudinal ligament C4-5 level. Spinal stenosis and cord flattening most prominent C4-5 and C5-6. If ligamentous injury or cord injury were of high clinical concern, MR imaging could be obtained for further delineation.  T2 superior endplate Schmorl's node deformity/compression fracture of indeterminate age. Acute injury not excluded.  Scarring lung apices.  IMPRESSION: On the scout view, left humeral neck fracture is noted. Recommend follow-up plain film examination for further delineation.  CT HEAD  No skull fracture or intracranial hemorrhage.  Small vessel disease type changes without CT evidence of large acute infarct.  Global atrophy without hydrocephalus.  CT CERVICAL SPINE  No cervical spine fracture.  Degenerative changes cervical spine with spine stenosis and cord flattening most prominent C4-5 and C5-6 level. If cord injury were of high clinical concern, MR imaging could be obtained for further delineation.  T2 superior endplate Schmorl's node deformity/compression fracture (20% loss of height) of indeterminate age. Acute injury not excluded.   Electronically Signed   By: Lacy Duverney M.D.   On: 05/18/2014 14:17   Pelvis Portable  05/19/2014   CLINICAL DATA:  Status post fixation of a left intertrochanteric fracture.  EXAM: PORTABLE PELVIS 1-2 VIEWS  COMPARISON:  Single view of the pelvis 05/18/2014.  FINDINGS: There is partial visualization of a new intramedullary nail and hip screw for fixation of a left intertrochanteric fracture. Visualized hardware is intact. Position and alignment of the patient's fracture is markedly improved.  Healing right subtrochanteric fracture with fixation hardware is noted. No new abnormality is identified. Foley catheter is in place.  IMPRESSION: Status post fixation of a left intertrochanteric fracture without evidence of complication.  Healing right subtrochanteric fracture with fixation hardware in place.   Electronically Signed   By: Drusilla Kanner M.D.   On: 05/19/2014 15:54   Pelvis Portable  04/21/2014   CLINICAL DATA:  Post op right femur surgery today.  EXAM: PORTABLE PELVIS 1-2 VIEWS  COMPARISON:  Earlier films of the same day  FINDINGS: Interval placement of an IM rod into the femoral shaft and interlocking screw into the femoral neck, transfixing the comminuted intertrochanteric fracture in near anatomic alignment. Distal aspect of the IM rod not visualized. Degenerative changes in bilateral hips, right greater than left. Mild diffuse osteopenia. Patchy femoral vascular calcifications.  IMPRESSION: 1. Internal fixation of right intertrochanteric fracture in near anatomic alignment.   Electronically Signed   By: Corlis Leak M.D.   On: 04/21/2014 15:38   Dg Chest Port 1 View  05/20/2014   CLINICAL DATA:  Shortness of breath.  Anxiety.  Symptoms today.  EXAM: PORTABLE CHEST - 1 VIEW  COMPARISON:  Single view of the chest 05/19/2014 and 05/18/2014. PA and lateral chest 03/2018 06/1014.  FINDINGS: The chest is hyperexpanded. The patient has small bilateral pleural effusions, greater on the left. Mild airspace disease is seen the left lung base heart size is normal. No pneumothorax identified. Surgical neck fracture left humerus is identified as on the prior examination.  IMPRESSION: Small bilateral pleural effusions, greater on the left. Mild appearing left basilar airspace disease could be due to atelectasis or infection.   Electronically Signed   By: Drusilla Kanner M.D.   On: 05/20/2014 10:23   Dg Shoulder Left  05/18/2014   CLINICAL DATA:  81 year old female post fall with left shoulder pain.  Initial encounter.  EXAM: LEFT SHOULDER - 2+ VIEW  COMPARISON:  None.  FINDINGS: Fracture of the left humeral surgical neck with slight rotation of the proximal humeral head/surgical neck with respect to the adjacent humeral shaft.  Calcified tortuous aorta.  IMPRESSION: Fracture of the left humeral surgical neck with slight rotation of the proximal humeral head/surgical neck with respect to the adjacent humeral shaft.   Electronically Signed   By: Lacy Duverney M.D.   On: 05/18/2014 14:20   Dg C-arm 1-60 Min-no Report  05/19/2014   CLINICAL DATA: hip   C-ARM 1-60 MINUTES  Fluoroscopy was utilized by the requesting physician.  No radiographic  interpretation.    Dg C-arm 61-120 Min-no Report  04/21/2014   CLINICAL DATA: surgery   C-ARM 61-120 MINUTES  Fluoroscopy was utilized by the requesting physician.  No radiographic  interpretation.    Dg Hip Unilat With Pelvis 2-3 Views Left  05/18/2014   CLINICAL DATA:  Pain following fall  EXAM: LEFT HIP (WITH PELVIS) 2-3 VIEWS  COMPARISON:  April 17, 2014  FINDINGS: Frontal pelvis as well as frontal and lateral left hip images were obtained. There is a comminuted fracture of the left intertrochanteric region with varus angulation at the fracture site. There is avulsion of the lesser trochanter medially. On the right, there is postoperative change with screw and rod fixation through a recent intertrochanteric and subtrochanteric femur fracture with major fracture fragments in near anatomic alignment. No dislocations appreciable. There is moderate narrowing of both hip joints. There are small calcified uterine leiomyomas within the pelvis. There is degenerative change in the lower lumbar spine.  IMPRESSION: Comminuted intertrochanteric femur fracture on the left with varus angulation at the fracture site and avulsion of the lesser trochanter. Postoperative change on the right. Moderate narrowing both hip joints. No dislocation.   Electronically Signed   By: Bretta Bang III M.D.   On: 05/18/2014 14:18   Dg Femur Min 2 Views Left  05/19/2014   CLINICAL DATA:  Left intramedullary rod placement. Left hip fracture.  EXAM: LEFT FEMUR 2 VIEWS  COMPARISON:  Multiple exams, including 05/19/2014 and 05/18/2014  FINDINGS: Left hip IM nail noted. Varus angulation at the intertrochanteric fracture site has resolved. Separate lesser trochanteric fragments noted. I do not observe any new fracture.  IMPRESSION: 1. Left hip IM nail noted with restoration abnormal angulation. 2. Lesser trochanteric fragments incidentally noted.   Electronically Signed   By: Gaylyn Rong M.D.   On: 05/19/2014 15:57   Dg Femur Min 2 Views Left  05/19/2014   CLINICAL DATA:  ORIF of a intertrochanteric proximal femur fracture.  EXAM: DG C-ARM 1-60 MIN - [facility]; LEFT FEMUR 2 VIEWS  COMPARISON:  05/18/2014.  FLUOROSCOPY TIME:  Radiation Exposure Index (as provided by the fluoroscopic device):  If the device does not provide the exposure index:  Fluoroscopy Time:  2 minutes and 45 seconds  Number of Acquired Images:  4  FINDINGS: Can intra medullary rod has been placed spanning the intertrochanteric fracture, supporting a compression screw. The major fracture fragments are in near-anatomic alignment. There is no new fracture or evidence of an operative complication.  IMPRESSION: Well-aligned fracture fragments following ORIF.   Electronically Signed   By: Amie Portland M.D.   On: 05/19/2014 14:46   Dg Femur Min 2 Views Left  05/18/2014   CLINICAL DATA:  Left intertrochanteric fracture  EXAM: LEFT FEMUR 2 VIEWS  COMPARISON:  Pelvis and left hip images obtained earlier in the day  FINDINGS: Frontal and lateral views obtained. There is a comminuted intertrochanteric femur fracture on the left with varus angulation of the fracture site and medial avulsion of the lesser trochanter. More distally, no fracture. No dislocation. There is mild narrowing of the left hip joint.  IMPRESSION: Comminuted  intertrochanteric femur fracture proximally. No fracture more distally. No dislocation. Mild narrowing left hip joint.   Electronically Signed   By: Bretta Bang III M.D.   On: 05/18/2014 17:28   Dg Femur, Min 2 Views Right  04/21/2014   CLINICAL DATA:  ORIF of a right proximal femur fracture.  EXAM: RIGHT FEMUR 2 VIEWS  COMPARISON:  04/17/2014  FINDINGS: Five submitted images show placement of an intra medullary rod spanning most of the right femur from the greater trochanter to the distal metaphysis. The spans the intertrochanteric fracture. The intra medullary rod supports a compression screw spanning the intertrochanteric fracture in to the femoral head. The orthopedic hardware is well-seated. The major fracture components are reduced into near anatomic alignment.  There is no new fracture or evidence of an operative complication.  IMPRESSION: Near-anatomic alignment of fracture fragments following ORIF of the proximal right femur fracture.   Electronically Signed   By: Amie Portland M.D.   On: 04/21/2014 13:41   Dg Femur, Min 2 Views Right  04/21/2014   CLINICAL DATA:  Preop imaging prior to fracture repair.  EXAM: RIGHT FEMUR 2 VIEWS  COMPARISON:  04/17/2014  FINDINGS: Proximal right intertrochanteric femur fracture is in noted. An oblique fracture extends from the posterior lateral meta diaphysis to the anterior proximal metaphysis, with a secondary fracture across the base of the lesser trochanter. Degree of fracture displacement is unchanged. Some angulation noted on the prior study is improved due to less hip abduction on the current exam.  Bones are demineralized. There is sclerosis along the superior femoral head suggesting chronic avascular necrosis. There is associated subchondral cystic change and sclerosis along the superior acetabular.  Bones are demineralized. Knee joint is normally aligned. There is soft tissue edema that predominates of  the lateral aspect of the hip and proximal thigh.   IMPRESSION: Comminuted mildly displaced right proximal femur intertrochanteric fracture. Alignment between the major fracture fragments is improved with more neutral right leg positioning on current is study,, with the right hip mildly abducted on the prior exam. No other change.   Electronically Signed   By: Amie Portland M.D.   On: 04/21/2014 11:56   Dg Femur Port, Min 2 Views Right  04/21/2014   CLINICAL DATA:  Right proximal femur fracture.  EXAM: RIGHT FEMUR PORTABLE 1 VIEW 2:05 p.m.  COMPARISON:  Radiographs dated 04/21/2014 at 11:16 a.m.  FINDINGS: The patient has undergone open reduction and internal fixation of the proximal right femur fracture. Intramedullary nail, distal fixation screw and compression screw have been inserted. Alignment and position of the fracture fragments is near anatomic.  IMPRESSION: Satisfactory appearance of the right femur after open reduction and internal fixation of proximal fracture.Negative.   Electronically Signed   By: Francene Boyers M.D.   On: 04/21/2014 14:38    ECG  PACs with controlled ventricular rate   ASSESSMENT AND PLAN  Principal Problem:   Fracture, intertrochanteric, left femur Active Problems:   Protein-calorie malnutrition, severe   CKD (chronic kidney disease), stage III   Fracture of left humerus   Fall at home   Essential hypertension   Anemia, chronic disease  1. Tachyrhythmia/PACs: patient has a history of frequent PACs and normal LVF on recent 2D echo. Tachy atrial arrythmia earlier today occurred in the setting of pain after recent hip surgery. Not convinced this was atrial fibrillation. P waves were noted. Her pain is now better controlled and she is in NSR on telemetry with occasional PACs. HR is stable in the 70s. Continue to monitor on telemetry. Continue BB therapy with metoprolol. No further w/u indicated at this point.    Signed, Robbie Lis, PA-C 05/20/2014, 1:46 PM  Personally seen and examined. Agree with  above. Thin, pleasant, heart rate regular with occasional ectopy, lungs clear. Currently, sinus rhythm with rate PACs. I personally reviewed telemetry with Brittney and there may be periods of time with brief atrial fibrillation although this is challenging to diagnose given baseline artifact. There is definitely occasional paroxysmal atrial tachycardia. This would not warrant anticoagulation. Normal ejection fraction. Agree with continuation of beta blocker therapy. No further cardiac workup at this time. As she continues to heal and pain becomes less, it is likely that we will see less supraventricular tachycardia/PAT.  Her blood pressures have been quite labile in the outpatient setting as high as 200 systolic as low as 90. Continue with current regimen.  We will sign off. Please let us know for week and be of further assistance.  Donato Schultz, MD

## 2014-05-20 NOTE — Progress Notes (Signed)
CSW met with pt / family 4/27 to assist with d/c planning. SNF bed offers provided to family. They will review options and contact CSW with their decision. CSW will continue to follow to assist with d/c planning to SNF when stable.  Werner Lean LCSW [phone]

## 2014-05-20 NOTE — Progress Notes (Signed)
OT Cancellation Note  Patient Details Name: Mancel Parsonsrnestine E Fann MRN: [id number] DOB: 05/07/1923   Cancelled Treatment:    Noted  Plans for SNF- will defer OT eval to SNF Domani Bakos, Karin GoldenLorraine D 05/20/2014, 8:16 AM

## 2014-05-20 NOTE — Progress Notes (Signed)
ANTIBIOTIC CONSULT NOTE - INITIAL  Pharmacy Consult for Vancomycin and Zosyn Indication: rule out sepsis  No Known Allergies  Patient Measurements: Height:  (172.7 cm) Weight: 110 lb 14.3 oz (50.3 kg) IBW/kg (Calculated) : 63.9 Adjusted Body Weight:   Vital Signs: Temp: 100.4 F (38 C) (04/28 0800) Temp Source: Core (Comment) (04/28 0800) BP: 127/40 mmHg (04/28 0504) Pulse Rate: 68 (04/28 0504) Intake/Output from previous day: 04/27 0701 - 04/28 0700 In: 1775 [I.V.:1675; IV Piggyback:100] Out: 1150 [Urine:1050; Blood:100] Intake/Output from this shift:    Labs:  Recent Labs  05/19/14 0435 05/19/14 1940 05/20/14 0325  WBC 8.1 14.2* 11.4*  HGB 9.4* 9.4* 8.5*  PLT 194 207 176  CREATININE 1.32* 1.31* 1.44*   Estimated Creatinine Clearance: 21 mL/min (by C-G formula based on Cr of 1.44). No results for input(s): VANCOTROUGH, VANCOPEAK, VANCORANDOM, GENTTROUGH, GENTPEAK, GENTRANDOM, TOBRATROUGH, TOBRAPEAK, TOBRARND, AMIKACINPEAK, AMIKACINTROU, AMIKACIN in the last 72 hours.   Microbiology: Recent Results (from the past 720 hour(s))  Surgical pcr screen     Status: None   Collection Time: 05/18/14 10:05 PM  Result Value Ref Range Status   MRSA, PCR NEGATIVE NEGATIVE Final   Staphylococcus aureus NEGATIVE NEGATIVE Final    Comment:        The Xpert SA Assay (FDA approved for NASAL specimens in patients over 68 years of age), is one component of a comprehensive surveillance program.  Test performance has been validated by [facility] for patients greater than or equal to 11 year old. It is not intended to diagnose infection nor to guide or monitor treatment.     Medical History: Past Medical History  Diagnosis Date  . Hypertension   . Hyperlipidemia   . Gout   . Osteoporosis   . CKD (chronic kidney disease) stage 4, GFR 15-29 ml/min   . Glaucoma   . Vitamin D deficiency     Medications:  Anti-infectives    Start     Dose/Rate Route Frequency  Ordered Stop   05/20/14 1000  piperacillin-tazobactam (ZOSYN) IVPB 3.375 g     3.375 g 100 mL/hr over 30 Minutes Intravenous STAT 05/20/14 0927 05/21/14 1000   05/20/14 1000  vancomycin (VANCOCIN) IVPB 1000 mg/200 mL premix     1,000 mg 200 mL/hr over 60 Minutes Intravenous STAT 05/20/14 0927 05/21/14 1000   05/19/14 2000  ceFAZolin (ANCEF) IVPB 2 g/50 mL premix     2 g 100 mL/hr over 30 Minutes Intravenous Every 6 hours 05/19/14 1831 05/20/14 0232   05/19/14 1245  ceFAZolin (ANCEF) IVPB 2 g/50 mL premix     2 g 100 mL/hr over 30 Minutes Intravenous  Once 05/19/14 1231 05/19/14 1253     Assessment: 81yo F recently underwent R femur fx repair, fell at home and fractured her L femur and L humerus. On 4/27 she underwent surgical repair of L femur fracture(L humerus in a sling). Post-op she became hypertensive and went into A.fib w/ RVR. Vanc and Zosyn ordered per pharmacy for SIRS/sepsis. Source unknown.  Tm 100.2  WBCs elevated  CKD. SCr elev, 1.44, CrCl 21CG, 30N  Blood and urine cx ordered    Goal of Therapy:  Vancomycin trough level 15-20 mcg/ml  Appropriate antibiotic dosing for renal function; eradication of infection  Plan:  Vancomycin 1g then  IV q24h. Zosyn 3.375g IV Q8H infused over 4hrs. Measure Vanc trough at steady state. Follow up renal fxn, culture results, and clinical course.  Charolotte Eke, PharmD, pager [phone]. 05/20/2014,10:17  AM.

## 2014-05-20 NOTE — Progress Notes (Signed)
Patient ID: Kimberly Avery, female   DOB: 05/25/1923, 81 y.o.   MRN: [id number]  [facility] PROGRESS NOTE  Kimberly Avery FVC:[id number] DOB: 10/15/1923 DOA: 05/18/2014 PCP: Marjorie Smolder, MD   Brief narrative:    81 y.o. female with essential hypertension, HLD, stage III chronic kidney disease, hospitalized 04/17/14-04/23/14 for sepsis secondary to pyelonephritis, severe hyponatremia (Na 106), acute blood loss anemia, elevated troponin from demand ischemia in the setting of severe illness, acute encephalopathy and intertrochanteric fracture of right hip for which she underwent medullary nail fixation, now presented with left shoulder and lower extremity pain following mechanical fall at home. She described pain as sharp and stabbing, intermittent and 10/10 in severity when present, non radiating, worse with even minimal movement with no specific alleviating factors.   In the ED, pt noted to be hemodynamically stable with VS notable for BP 92/47 and up to 205/83 mmHg, Cr 1.33, Hg 11.6. Imaging studies included CT head and cervical spine without acute findings, left shoulder x-ray with fracture of the left humeral surgical neck and left hip x-ray shows communited intertrochanteric femur fracture. Orthopedic surgeon/Dr. Lyla Glassing was consulted by ED PA and [facility] asked to admit pt for further evaluation. Plan to take pt to OR 05/19/2014.  Major events since admission: 4/27 - taken to OR, developed post op a-fib with HR 110 - 120's, HTN-ive urgency with SBP in 180's, requiring transfer to SDU 4/28 - new onset fever T 100.4, RR 23-32 bpm, SIRS/sepsis work up initiated; still in a-fib, Cr trending up 1.31 --> 1.44; cardiology consulted  Assessment/Plan:    Principal Problem:   Fracture, intertrochanteric, left femur, after an episode of mechanical fall - s/p ORIF, post op day #1, still week and with poor oral intake, pain 3/10 at this time  - appreciate ortho team assistance -  recommendation is to proceed with PT as pt able to tolerate, NWB LUE, TDWB BLE - DVT prophylaxis Lovenox SQ - will need SNF once medically stable for discharge   Active Problems:   SIRS am of 4/28 - criteria for SIRS met 4/28 with T 100.4, HR in 110's, RR 23 - 32 bpm, WBC 11 K - unknown source at this time - will proceed with checking lactic acid and procalcitonin level - urinalysis, urine and blood culture requested, CXR also requested - started broad spectrum ABX for now and will narrow down once more data is back - repeat CBC in AM    HTN-ive urgency post op and requiring transfer to SDU 4/27 - BP much better this AM with SBP in 120's - placed on hydralazine scheduled and as needed and will continue the same regimen - also continue Metoprolol as per home medical regimen, 12.5 mg PO BID - d/c Benazepril due to worsening renal insufficiency     Post op atrial fibrillation with RVR 4/27 - transferred to SDU post op - placed on Metoprolol as needed in addition to continuing home medical regimen with Metoprolol 12.5 mg PO BID - still in a-fib this AM but HR better controlled - cardiology team consulted for assistance    Protein-calorie malnutrition, severe - in the context of progressive FTT and deconditioning - nutritionist consulted, will follow up on recommendations     CKD (chronic kidney disease), stage III - Cr trending up since admission: 1.31 --> 1.44 - will hold Benazepril for now until renal function stabilizes - continue to hold Lasix that pt takes at home 20 mg PO QD - repeat BMP  in AM    Fracture of left humerus - Dr. Onnie Graham recommends nonop treatment in sling, NWB - OT will be requested as well prior to discharge     Chronic diastolic CHF - last 2 D ECHO 03/2014 with normal EF but grade I diastolic CHF, no need to repeat 2 D ECHO at this time - pt is euvolemic on exam, takes lasix at home which has been on hold since admission due to elevated Cr  - weight remains  stable at 110 lbs with pt says is her baseline weight  - monitor daily weights, strict I/O - CXR requested for SIRS/sepsis work up    Cervical spinal stenosis, noted on CT cervical spine - most notable at C4-C5, C5-C6 - will monitor clinical status and may need to proceed with MRI of the cervical spine for closely evaluation if it becomes clinically indicated     Falls at home - likely secondary to progressive deconditioning - will need PT evaluation prior to discharge     Anemia of chronic disease, CKD, IDA - drop in Hg since admission likely from dilutional component from IVF pt has received and some post op acute blood loss anemia  - no signs of active bleeding - repeat CBC in AM    Chronic respiratory failure secondary to COPD - change albuterol to levalbuterol due to a-fib    Underweight  - Body mass index is 16.86  DVT prophylaxis - Lovenox SQ   Code Status: Full.  Family Communication:  plan of care discussed with the patient Disposition Plan: SIRS/sepsis work up in progress, still in a-fib and requiring cardiology consult, will likely go to SNF when stable for d/c. Keep in SDU today   IV access:  Peripheral IV  Procedures and diagnostic studies:    Dg Chest 1 View  05/18/2014  No active disease.     Ct Head Wo Contrast  05/18/2014   No skull fracture or intracranial hemorrhage.  Small vessel disease type changes without CT evidence of large acute infarct.  Global atrophy without hydrocephalus.    Ct Cervical Spine Wo Contrast  05/18/2014   No cervical spine fracture.  Degenerative changes cervical spine with spine stenosis and cord flattening most prominent C4-5 and C5-6 level. If cord injury were of high clinical concern, MR imaging could be obtained for further delineation.  T2 superior endplate Schmorl's node deformity/compression fracture (20% loss of height) of indeterminate age. Acute injury not excluded.     Dg Shoulder Left  05/18/2014  Fracture of the left humeral  surgical neck with slight rotation of the proximal humeral head/surgical neck with respect to the adjacent humeral shaft.     Dg Hip Unilat With Pelvis 2-3 Views Left  05/18/2014  Comminuted intertrochanteric femur fracture on the left with varus angulation at the fracture site and avulsion of the lesser trochanter. Postoperative change on the right. Moderate narrowing both hip joints. No dislocation.    Dg Femur Min 2 Views Left  05/19/2014   Well-aligned fracture fragments following ORIF.     Dg Femur Min 2 Views Left  05/18/2014  Comminuted intertrochanteric femur fracture proximally. No dislocation.   Dg Chest 1 View  05/19/2014  Stable COPD and borderline cardiomegaly.  No active lung disease.   Medical Consultants:  Ortho  Other Consultants:  PT/OT Nutritionist   IAnti-Infectives:   None   Faye Ramsay, MD  [place] Pager [phone]  If 7PM-7AM, please contact night-coverage [email or web address] Password [facility] 05/20/2014, 9:56 AM  LOS: 2 days   HPI/Subjective: Still with pain in the left arm and left hip area, 3/10 after pain medication. Feels weak and tired.   Objective: Filed Vitals:   05/20/14 0500 05/20/14 0504 05/20/14 0800 05/20/14 0810  BP: 170/58 127/40  122/67  Pulse: 71 68  81  Temp:   100.4 F (38 C) 100.2 F (37.9 C)  TempSrc:   Core (Comment)   Resp: 18 20  23   Height:      Weight: 50.3 kg (110 lb 14.3 oz)     SpO2: 98% 98%  99%    Intake/Output Summary (Last 24 hours) at 05/20/14 0956 Last data filed at 05/20/14 0500  Gross per 24 hour  Intake   1775 ml  Output   1150 ml  Net    625 ml    Exam:   General:  Pt is alert, follows commands appropriately, not in acute distress  Cardiovascular: IRRR, S1/S2, no rubs, no gallops  Respiratory: Clear to auscultation bilaterally, no wheezing, diminished breath sounds at bases   Abdomen: Soft, non tender, non distended, bowel sounds present, no guarding  Extremities: TTP in the left hip area, pulses DP  and PT palpable bilaterally  Neuro: Grossly nonfocal  Data Reviewed: Basic Metabolic Panel:  Recent Labs Lab 05/18/14 1235 05/19/14 0435 05/19/14 1940 05/20/14 0325  NA 142 143  --  141  K 4.2 4.2  --  3.9  CL 111 113*  --  111  CO2 25 23  --  23  GLUCOSE 140* 134*  --  131*  BUN 17 18  --  22  CREATININE 1.33* 1.32* 1.31* 1.44*  CALCIUM 8.4 8.2*  --  7.7*   CBC:  Recent Labs Lab 05/18/14 1235 05/19/14 0435 05/19/14 1940 05/20/14 0325  WBC 7.5 8.1 14.2* 11.4*  NEUTROABS 6.2  --   --   --   HGB 11.6* 9.4* 9.4* 8.5*  HCT 36.3 28.8* 28.9* 25.9*  MCV 94.0 92.9 93.5 92.8  PLT 234 194 207 176    Recent Results (from the past 240 hour(s))  Surgical pcr screen     Status: None   Collection Time: 05/18/14 10:05 PM  Result Value Ref Range Status   MRSA, PCR NEGATIVE NEGATIVE Final   Staphylococcus aureus NEGATIVE NEGATIVE Final     Scheduled Meds: . benazepril  20 mg Oral Daily  . docusate sodium  100 mg Oral BID  . dorzolamide-timolol  1 drop Both Eyes BID  . enoxaparin (LOVENOX) injection  30 mg Subcutaneous Q24H  . hydrALAZINE  10 mg Oral 3 times per day  . metoprolol tartrate  12.5 mg Oral BID  . piperacillin-tazobactam  3.375 g Intravenous STAT  . simvastatin  20 mg Oral q1800  . vancomycin  1,000 mg Intravenous STAT   Continuous Infusions:

## 2014-05-20 NOTE — Progress Notes (Signed)
CRITICAL VALUE ALERT  Critical value received:  Lactic acid 2.9   Date of notification:  05/20/2014  Time of notification:  1430  Critical value read back:Yes.    Nurse who received alert:  Aundra MilletMegan RN  MD notified (1st page):  Izola PriceMyers  Time of first page:    MD notified (2nd page):  Time of second page:  Responding MD:    Time MD responded:

## 2014-05-20 NOTE — Op Note (Signed)
NAMMarland Kitchen:  Thomasenia BottomsSTEPHENS, Kimberly Avery          ACCOUNT NO.:  [id number]  MEDICAL RECORD NO.:  [id number]  LOCATION:  1236                         [facility]  PHYSICIAN:  Samson FredericBrian Kemuel Buchmann, MD     DATE OF BIRTH:  April 27, 1923  DATE OF PROCEDURE:  05/19/2014 DATE OF DISCHARGE:                              OPERATIVE REPORT   SURGEON:  Samson FredericBrian Shanaiya Bene, MD.  ASSISTANT:  None.  PREOPERATIVE DIAGNOSIS:  Left pertrochanteric femur fracture.  POSTOPERATIVE DIAGNOSIS:  Left pertrochanteric femur fracture.  PROCEDURE PERFORMED:  Cephalomedullary nail fixation of left pertrochanteric femur fracture.  IMPLANTS: 1. Stryker Gamma 3 nail, 11 x 380 mm x 125 degrees. 2. 10.5 x 85 mm Titanium lag screw. 3. 5-mm distal interlocking screw x1.  ANESTHESIA:  General.  EBL:  100 mL.  COMPLICATIONS:  None.  TUBES AND DRAINS:  None.  DISPOSITION:  Stable to PACU.  SPECIMENS:  None.  ANTIBIOTICS:  2 g Ancef.  INDICATIONS:  The patient is an [age] year old female, who is well known to me.  She underwent IM nail fixation of a right pertrochanteric femur fracture on April 21, 2014.  The patient had been doing well until she tripped and fell at home yesterday, injuring her left shoulder and left hip.  [facility] where x-rays of the shoulder revealed a 2-part proximal humerus fracture as well as an intertrochanteric femur fracture.  She was admitted to the hospitalist service and underwent medical optimization and perioperative risk stratification.  Risks, benefits, and alternatives to surgical fixation of her left hip were explained, and the family elected to proceed.  Note that the shoulder fracture will be under the care of Dr. Rennis ChrisSupple.  DESCRIPTION OF PROCEDURE IN DETAIL:  The patient was correctly identified in the preop holding area using 2 identifiers.  Surgical site was marked by myself.  The patient was taken to the operating room and general  anesthesia was induced on her hospital bed.  She already had a Foley catheter in place.  [facility]. The nonoperative extremity was scissored underneath.  The fracture was reduced with traction, abduction, and internal rotation.  AP and lateral fluoroscopy views were used to confirm near anatomic fracture reduction. The left lower extremity was then prepped and draped in normal sterile surgical fashion.  Time-out was called verifying site of surgery.  She received IV antibiotics within 60 minutes of beginning of the procedure.  I began by using fluoroscopy to define her anatomy.  I made a 4-cm incision proximal to the tip of the greater trochanter.  I used a guidepin to establish a standard trochanteric entry site.  I then used an entry reamer.  I placed a guidewire down to the physeal scar of the knee.  I measured the length of the nail.  I then reamed up to a 12.5 reamer with excellent chatter.  I placed the real nail.  Through a separate stab incision, I placed a cannula for the lag screw. Using AP and lateral fluoroscopy views, I placed a guide pin for the cephalomedullary screw.  I then measured, reamed, and inserted the screw.  Tip-apex distance was appropriate.  I then took down traction and compressed the fracture through the jig.  I then inserted this set screw and this was then tightened and then loosened one-quarter turn. The fracture was near anatomically reduced.  Using perfect circle technique, I placed 1 distal interlocking screw.  Final AP and lateral fluoroscopy views were obtained.  All wounds were copiously irrigated with sterile saline.  I closed the wounds in layers using #1 Vicryl for the fascia, 2-0 Monocryl for the deep dermal layer, and running 3-0 Monocryl subcuticular stitch.  Dermabond was applied to the skin.  Once the glue was fully dried, Mepilex dressings were applied.  The patient was then taken off the [facility] table.   Extubated and taken to PACU in stable condition.  Sponge, needle, and instrument counts were correct at the end of the case x2.  There were no known complications.  I discussed operative events and findings with the patient's family.  PLAN:  Will be for her to be bed-to-chair transfers for the next 6 weeks.  She will be on Lovenox.  She will follow up with Dr. Rennis Chris in the office in 1 week for shoulder radiographs.  I will see her in the office in 2 weeks with regard to her left hip.  We will work on skilled nursing disposition.  All questions were solicited and answered to her satisfaction.          ______________________________ Samson Frederic, MD     BS/MEDQ  D:  05/19/2014  T:  05/20/2014  Job:  [id number]

## 2014-05-20 NOTE — Progress Notes (Signed)
   Subjective:  Patient reports pain as mild.  Developed afib / HTN in PACU --> sent to SDU by hospitalist.  Objective:   VITALS:   Filed Vitals:   05/20/14 0338 05/20/14 0400 05/20/14 0500 05/20/14 0504  BP:  150/49 170/58 127/40  Pulse:  70 71 68  Temp: 98.6 F (37 C)     TempSrc: Oral     Resp:  25 18 20   Height:      Weight:   50.3 kg (110 lb 14.3 oz)   SpO2:  98% 98% 98%   LUE in sling. + AIN/PIN/U. 2+ radial. SILT. ABD soft Sensation intact distally Intact pulses distally Dorsiflexion/Plantar flexion intact Incision: dressing C/D/I    Lab Results  Component Value Date   WBC 11.4* 05/20/2014   HGB 8.5* 05/20/2014   HCT 25.9* 05/20/2014   MCV 92.8 05/20/2014   PLT 176 05/20/2014   BMET    Component Value Date/Time   NA 141 05/20/2014 0325   NA 140 05/03/2014   K 3.9 05/20/2014 0325   CL 111 05/20/2014 0325   CO2 23 05/20/2014 0325   GLUCOSE 131* 05/20/2014 0325   BUN 22 05/20/2014 0325   BUN 24* 05/03/2014   CREATININE 1.44* 05/20/2014 0325   CREATININE 1.2* 05/03/2014   CALCIUM 7.7* 05/20/2014 0325   GFRNONAA 31* 05/20/2014 0325   GFRAA 36* 05/20/2014 0325     Assessment/Plan: 1 Day Post-Op   Principal Problem:   Fracture, intertrochanteric, left femur Active Problems:   Protein-calorie malnutrition, severe   CKD (chronic kidney disease), stage III   Fracture of left humerus   Fall at home   Essential hypertension   Anemia, chronic disease   NWB LUE, TDWB BLE PT/OT for bed to chair xfers (x6 weeks) DVT ppx: Lovenox, SCDs, TEDs PO pain control Will need SNF   Yalexa Blust, Cloyde ReamsBrian James 05/20/2014, 7:10 AM   Samson FredericBrian Kinzie Wickes, MD Cell [phone]

## 2014-05-20 NOTE — Care Management Note (Signed)
  Page 2 of 2   05/20/2014     10:41:49 AM CARE MANAGEMENT NOTE 05/20/2014  Patient:  Kimberly Avery,Kimberly E   Account Number:  [id number]  Date Initiated:    Documentation initiated by:    Subjective/Objective Assessment:   LEFT INTERTROCHANTIC FEMUR  FRACTURE/81 y.o. female who is known to me for R femoral neck/ pertrochanteric femur fracture s/p IM nail on 04/21/14. Has been TDWB RLE. Tripped and fell at home yesterday, injuring L shoulder and L hip      Action/Plan:   home when stable versus snf for rehab.   Anticipated DC Date:  05/23/2014   Anticipated DC Plan:    In-house referral  Clinical Social Worker      DC Planning Services  CM consult      [facility]  [facility]   Choice offered to / List presented to:  NA      DME agency  NA     HH arranged  NA      HH agency  NA   Status of service:  In process, will continue to follow Medicare Important Message given?   (If response is "NO", the following Medicare IM given date fields will be blank) Date Medicare IM given:   Medicare IM given by:   Date Additional Medicare IM given:   Additional Medicare IM given by:    Discharge Disposition:    Per UR Regulation:  Reviewed for med. necessity/level of care/duration of stay  If discussed at Long Length of Stay Meetings, dates discussed:    Comments:  May 20, 2014/Rhonda L. Earlene Plateravis, RN, BSN, CCM. Case Management [employer] [phone] No discharge needs present of time of review.

## 2014-05-20 NOTE — Evaluation (Signed)
Physical Therapy Evaluation Patient Details Name: Kimberly Avery MRN: [id number] DOB: [birth year] Today's Date: 05/20/2014   History of Present Illness  81 yo female s/p L femur cephalomedullary nail fixation 05/19/14, L shoulder fx. Hx of R femur fx 03/2014-still TDWB.   Clinical Impression  On eval, pt required Total assist +2 for mobility-scoot transfer from bed to recliner utilizing bed pad. Pt tolerated session well. Recommend SNF.     Follow Up Recommendations SNF;Supervision/Assistance - 24 hour    Equipment Recommendations  None recommended by PT    Recommendations for Other Services       Precautions / Restrictions Precautions Precautions: Fall Required Braces or Orthoses: Sling (L UE) Restrictions Weight Bearing Restrictions: Yes LUE Weight Bearing: Non weight bearing RLE Weight Bearing: Touchdown weight bearing LLE Weight Bearing: Touchdown weight bearing      Mobility  Bed Mobility Overal bed mobility: Needs Assistance Bed Mobility: Supine to Sit     Supine to sit: Total assist;+2 for physical assistance;+2 for safety/equipment;HOB elevated     General bed mobility comments: Assist for trunk and bil LEs. Utilized bedpad for scooting, positioning.   Transfers Overall transfer level: Needs assistance   Transfers: Lateral/Scoot Transfers          Lateral/Scoot Transfers: Total assist;+2 physical assistance;+2 safety/equipment General transfer comment: Utilized bedpad for scooting bed to recliner. Limited assist from pt due to L UE sling. Increased time.   Ambulation/Gait                Stairs            Wheelchair Mobility    Modified Rankin (Stroke Patients Only)       Balance Overall balance assessment: Needs assistance;History of Falls Sitting-balance support: Single extremity supported;Feet supported Sitting balance-Leahy Scale: Fair                                       Pertinent Vitals/Pain Pain  Assessment: Faces Faces Pain Scale: Hurts little more Pain Location: L LE Pain Descriptors / Indicators: Sharp;Aching Pain Intervention(s): Limited activity within patient's tolerance;Repositioned    Home Living Family/patient expects to be discharged to:: Skilled nursing facility                      Prior Function Level of Independence: Needs assistance               Hand Dominance        Extremity/Trunk Assessment   Upper Extremity Assessment: LUE deficits/detail       LUE Deficits / Details: in sling.   Lower Extremity Assessment: RLE deficits/detail;LLE deficits/detail RLE Deficits / Details: moves ankle well. knee ext at least 3/5 LLE Deficits / Details: moves ankle well. knee ext at least 3/5  Cervical / Trunk Assessment: Normal  Communication   Communication: No difficulties  Cognition Arousal/Alertness: Awake/alert Behavior During Therapy: WFL for tasks assessed/performed Overall Cognitive Status: Within Functional Limits for tasks assessed                      General Comments      Exercises        Assessment/Plan    PT Assessment Patient needs continued PT services  PT Diagnosis Acute pain;Generalized weakness   PT Problem List Decreased strength;Decreased range of motion;Decreased activity tolerance;Decreased balance;Decreased mobility;Pain;Decreased knowledge of use of DME;Decreased knowledge of precautions  PT Treatment Interventions Functional mobility training;Therapeutic activities;Therapeutic exercise;Patient/family education;Balance training;DME instruction   PT Goals (Current goals can be found in the Care Plan section) Acute Rehab PT Goals Patient Stated Goal: none stated PT Goal Formulation: With patient/family Time For Goal Achievement: 06/03/14 Potential to Achieve Goals: Fair    Frequency Min 3X/week   Barriers to discharge        Co-evaluation               End of Session   Activity Tolerance:  Patient tolerated treatment well Patient left: in chair;with call bell/phone within reach           Time: 1425-1449 PT Time Calculation (min) (ACUTE ONLY): 24 min   Charges:   PT Evaluation $Initial PT Evaluation Tier I: 1 Procedure PT Treatments $Therapeutic Activity: 8-22 mins   PT G Codes:        Rebeca Alert, MPT Pager: [phone]

## 2014-05-20 NOTE — Progress Notes (Signed)
Initial Nutrition Assessment  DOCUMENTATION CODES:  Severe malnutrition in context of chronic illness  INTERVENTION:  Boost Breeze   Advance diet as medically tolerated  NUTRITION DIAGNOSIS:  Underweight related to chronic illness as evidenced by other (see comment) (BMI of 16.8).  GOAL:  Patient will meet greater than or equal to 90% of their needs  MONITOR:  PO intake, Supplement acceptance, Diet advancement, Labs, Weight trends, Skin  REASON FOR ASSESSMENT:  Consult Assessment of nutrition requirement/status  ASSESSMENT: 81 y.o. female who is known to me for R femoral neck/ pertrochanteric femur fracture s/p IM nail on 04/21/14. Has been TDWB RLE. Tripped and fell at home yesterday, injuring L shoulder and L hip.  - S/P surgical repair of hip fx 4/27 - Pt reports that she normally eats well. She eats three meals per day and cooks at home. Says that her usual body weight is 94-96 lbs but can fluctuate. Current weight is 110 lbs. She attributes weight gain to eating more. Pt has tried Ensure supplements but they cause her to have diarrhea. Current diet is clear liquids. Will order Resource Breeze to improve po intake and avoid diarrhea.   Labs and medications reviewed   Height:  Ht Readings from Last 1 Encounters:  05/18/14 5\' 8"  (1.727 m)    Weight:  Wt Readings from Last 1 Encounters:  05/20/14 110 lb 14.3 oz (50.3 kg)    Ideal Body Weight:  63.6 kg  Wt Readings from Last 10 Encounters:  05/20/14 110 lb 14.3 oz (50.3 kg)  05/12/14 110 lb (49.896 kg)  04/26/14 104 lb (47.174 kg)  04/24/14 104 lb 15 oz (47.6 kg)  04/02/14 95 lb (43.092 kg)  11/19/13 94 lb 14.4 oz (43.046 kg)  10/06/13 96 lb 12.8 oz (43.908 kg)  09/21/13 91 lb 6.4 oz (41.459 kg)    BMI:  Body mass index is 16.86 kg/(m^2).  Estimated Nutritional Needs:  Kcal:  1500-1700  Protein:  90-100 g  Fluid:  1.6 L/day  Skin:     Diet Order:  Diet clear liquid Room service appropriate?:  Yes; Fluid consistency:: Thin  EDUCATION NEEDS:  Education needs addressed   Intake/Output Summary (Last 24 hours) at 05/20/14 1251 Last data filed at 05/20/14 0500  Gross per 24 hour  Intake   1500 ml  Output    850 ml  Net    650 ml    Last BM:  Prior to admission  Kimberly KluverHaley Zakia Sainato MS, RD, LDN [phone]

## 2014-05-21 LAB — BASIC METABOLIC PANEL
Anion gap: 7 (ref 5–15)
BUN: 31 mg/dL — ABNORMAL HIGH (ref 6–23)
CO2: 24 mmol/L (ref 19–32)
CREATININE: 1.81 mg/dL — AB (ref 0.50–1.10)
Calcium: 7.7 mg/dL — ABNORMAL LOW (ref 8.4–10.5)
Chloride: 111 mmol/L (ref 96–112)
GFR calc non Af Amer: 24 mL/min — ABNORMAL LOW (ref >90)
GFR, EST AFRICAN AMERICAN: 27 mL/min — AB (ref >90)
Glucose, Bld: 127 mg/dL — ABNORMAL HIGH (ref 70–99)
POTASSIUM: 3.7 mmol/L (ref 3.5–5.1)
SODIUM: 142 mmol/L (ref 135–145)

## 2014-05-21 LAB — CBC
HEMATOCRIT: 22.3 % — AB (ref 36.0–46.0)
Hemoglobin: 7.5 g/dL — ABNORMAL LOW (ref 12.0–15.0)
MCH: 30.6 pg (ref 26.0–34.0)
MCHC: 33.6 g/dL (ref 30.0–36.0)
MCV: 91 fL (ref 78.0–100.0)
Platelets: 159 10*3/uL (ref 150–400)
RBC: 2.45 MIL/uL — ABNORMAL LOW (ref 3.87–5.11)
RDW: 15.4 % (ref 11.5–15.5)
WBC: 10.8 10*3/uL — AB (ref 4.0–10.5)

## 2014-05-21 MED ORDER — PIPERACILLIN-TAZOBACTAM 3.375 G IVPB
3.3750 g | Freq: Two times a day (BID) | INTRAVENOUS | Status: DC
  Administered 2014-05-21: 3.375 g via INTRAVENOUS
  Filled 2014-05-21 (×2): qty 50

## 2014-05-21 MED ORDER — PIPERACILLIN-TAZOBACTAM IN DEX 2-0.25 GM/50ML IV SOLN
2.2500 g | Freq: Three times a day (TID) | INTRAVENOUS | Status: DC
  Administered 2014-05-21 – 2014-05-22 (×2): 2.25 g via INTRAVENOUS
  Filled 2014-05-21 (×3): qty 50

## 2014-05-21 NOTE — Progress Notes (Signed)
ANTIBIOTIC CONSULT NOTE   Pharmacy Consult for Vancomycin and Zosyn Indication: rule out sepsis  No Known Allergies  Patient Measurements: Height:  (172.7 cm) Weight: 110 lb 14.3 oz (50.3 kg) IBW/kg (Calculated) : 63.9 Adjusted Body Weight:   Vital Signs: Temp: 100.2 F (37.9 C) (04/29 1112) Temp Source: Oral (04/29 0300) BP: 100/48 mmHg (04/29 1112) Pulse Rate: 77 (04/29 1112) Intake/Output from previous day: 04/28 0701 - 04/29 0700 In: 590 [P.O.:240; IV Piggyback:350] Out: 403 [Urine:403] Intake/Output from this shift: Total I/O In: 100 [IV Piggyback:100] Out: 150 [Urine:150]  Labs:  Recent Labs  05/19/14 1940 05/20/14 0325 05/21/14 0348  WBC 14.2* 11.4* 10.8*  HGB 9.4* 8.5* 7.5*  PLT 207 176 159  CREATININE 1.31* 1.44* 1.81*   Estimated Creatinine Clearance: 16.7 mL/min (by C-G formula based on Cr of 1.81). No results for input(s): VANCOTROUGH, VANCOPEAK, VANCORANDOM, GENTTROUGH, GENTPEAK, GENTRANDOM, TOBRATROUGH, TOBRAPEAK, TOBRARND, AMIKACINPEAK, AMIKACINTROU, AMIKACIN in the last 72 hours.   Microbiology: Recent Results (from the past 720 hour(s))  Surgical pcr screen     Status: None   Collection Time: 05/18/14 10:05 PM  Result Value Ref Range Status   MRSA, PCR NEGATIVE NEGATIVE Final   Staphylococcus aureus NEGATIVE NEGATIVE Final    Comment:        The Xpert SA Assay (FDA approved for NASAL specimens in patients over 19 years of age), is one component of a comprehensive surveillance program.  Test performance has been validated by [facility] for patients greater than or equal to 9 year old. It is not intended to diagnose infection nor to guide or monitor treatment.   Culture, blood (x 2)     Status: None (Preliminary result)   Collection Time: 05/20/14 10:00 AM  Result Value Ref Range Status   Specimen Description BLOOD RIGHT ARM  Final   Special Requests BOTTLES DRAWN AEROBIC ONLY 2CC  Final   Culture   Final           BLOOD  CULTURE RECEIVED NO GROWTH TO DATE CULTURE WILL BE HELD FOR 5 DAYS BEFORE ISSUING A FINAL NEGATIVE REPORT Note: Culture results may be compromised due to an inadequate volume of blood received in culture bottles. Performed at [facility]    Report Status PENDING  Incomplete  Culture, blood (x 2)     Status: None (Preliminary result)   Collection Time: 05/20/14 10:02 AM  Result Value Ref Range Status   Specimen Description BLOOD RIGHT HAND  Final   Special Requests BOTTLES DRAWN AEROBIC AND ANAEROBIC 5CC  Final   Culture   Final           BLOOD CULTURE RECEIVED NO GROWTH TO DATE CULTURE WILL BE HELD FOR 5 DAYS BEFORE ISSUING A FINAL NEGATIVE REPORT Performed at [facility]    Report Status PENDING  Incomplete    Medical History: Past Medical History  Diagnosis Date  . Hypertension   . Hyperlipidemia   . Gout   . Osteoporosis   . CKD (chronic kidney disease) stage 4, GFR 15-29 ml/min   . Glaucoma   . Vitamin D deficiency     Medications:  Anti-infectives    Start     Dose/Rate Route Frequency Ordered Stop   05/21/14 1600  piperacillin-tazobactam (ZOSYN) IVPB 3.375 g     3.375 g 12.5 mL/hr over 240 Minutes Intravenous Every 12 hours 05/21/14 0826     05/21/14 0800  vancomycin (VANCOCIN) 500 mg in sodium chloride 0.9 %  100 mL IVPB     500 mg 100 mL/hr over 60 Minutes Intravenous Every 24 hours 05/20/14 1018     05/20/14 1800  piperacillin-tazobactam (ZOSYN) IVPB 3.375 g  Status:  Discontinued     3.375 g 12.5 mL/hr over 240 Minutes Intravenous Every 8 hours 05/20/14 1018 05/21/14 0826   05/20/14 1000  piperacillin-tazobactam (ZOSYN) IVPB 3.375 g     3.375 g 100 mL/hr over 30 Minutes Intravenous STAT 05/20/14 0927 05/20/14 1049   05/20/14 1000  vancomycin (VANCOCIN) IVPB 1000 mg/200 mL premix     1,000 mg 200 mL/hr over 60 Minutes Intravenous STAT 05/20/14 0927 05/20/14 1119   05/19/14 2000  ceFAZolin (ANCEF) IVPB 2 g/50 mL premix     2 g 100 mL/hr over  30 Minutes Intravenous Every 6 hours 05/19/14 1831 05/20/14 0232   05/19/14 1245  ceFAZolin (ANCEF) IVPB 2 g/50 mL premix     2 g 100 mL/hr over 30 Minutes Intravenous  Once 05/19/14 1231 05/19/14 1253     Assessment: 81yo F recently underwent R femur fx repair, fell at home and fractured her L femur and L humerus. On 4/27 she underwent surgical repair of L femur fracture(L humerus in a sling). Post-op she became hypertensive and went into A.fib w/ RVR. Vanc and Zosyn ordered per pharmacy for SIRS/sepsis. Source unknown.  Tm 100.6  WBCs elevated but decreasing  CKD. SCr elev, 1.44 -> 1.81  Blood and urine cx pending    Goal of Therapy:  Vancomycin trough level 15-20 mcg/ml  Appropriate antibiotic dosing for renal function; eradication of infection  Plan:  No change to Vancomycin 500mg  IV q24h. Change Zosyn 3.375g IV to Q12H infused over 4hrs. Measure Vanc trough at steady state. Follow up renal fxn, culture results, and clinical course.  Otho BellowsGreen, Alden Feagan L PharmD Pager [phone] 05/21/2014, 12:58 PM

## 2014-05-21 NOTE — Progress Notes (Signed)
   Subjective:  Patient reports pain as mild.   Objective:   VITALS:   Filed Vitals:   05/21/14 0400 05/21/14 0500 05/21/14 0600 05/21/14 0700  BP: 107/38 82/28 100/43 104/51  Pulse: 70 79 81 84  Temp: 99.5 F (37.5 C) 99.7 F (37.6 C) 99.9 F (37.7 C) 99.7 F (37.6 C)  TempSrc:      Resp: 18 17 27 25   Height:      Weight:      SpO2: 98% 100% 100% 100%   LUE in sling. + AIN/PIN/U. 2+ radial. SILT. ABD soft Sensation intact distally Intact pulses distally Dorsiflexion/Plantar flexion intact Incision: dressing C/D/I    Lab Results  Component Value Date   WBC 10.8* 05/21/2014   HGB 7.5* 05/21/2014   HCT 22.3* 05/21/2014   MCV 91.0 05/21/2014   PLT 159 05/21/2014   BMET    Component Value Date/Time   NA 142 05/21/2014 0348   NA 140 05/03/2014   K 3.7 05/21/2014 0348   CL 111 05/21/2014 0348   CO2 24 05/21/2014 0348   GLUCOSE 127* 05/21/2014 0348   BUN 31* 05/21/2014 0348   BUN 24* 05/03/2014   CREATININE 1.81* 05/21/2014 0348   CREATININE 1.2* 05/03/2014   CALCIUM 7.7* 05/21/2014 0348   GFRNONAA 24* 05/21/2014 0348   GFRAA 27* 05/21/2014 0348     Assessment/Plan: 2 Days Post-Op   Principal Problem:   Fracture, intertrochanteric, left femur Active Problems:   Protein-calorie malnutrition, severe   CKD (chronic kidney disease), stage III   Fracture of left humerus   Fall at home   Essential hypertension   Anemia, chronic disease   NWB LUE, TDWB BLE PT/OT for bed to chair xfers (x6 weeks) DVT ppx: Lovenox, SCDs, TEDs PO pain control Will need SNF   Kimberly Avery, Kimberly Avery 05/21/2014, 7:39 AM   Kimberly FredericBrian Moria Brophy, MD Cell [phone]

## 2014-05-21 NOTE — Progress Notes (Signed)
Patient ID: Kimberly Avery, female   DOB: 09/16/1923, 81 y.o.   MRN: [id number]  [facility] PROGRESS NOTE  MARGARETT VITI CBJ:[id number] DOB: 11/17/1923 DOA: 05/18/2014 PCP: Marjorie Smolder, MD   Brief narrative:    81 y.o. female with essential hypertension, HLD, stage III chronic kidney disease, hospitalized 04/17/14-04/23/14 for sepsis secondary to pyelonephritis, severe hyponatremia (Na 106), acute blood loss anemia, elevated troponin from demand ischemia in the setting of severe illness, acute encephalopathy and intertrochanteric fracture of right hip for which she underwent medullary nail fixation, now presented with left shoulder and lower extremity pain following mechanical fall at home. She described pain as sharp and stabbing, intermittent and 10/10 in severity when present, non radiating, worse with even minimal movement with no specific alleviating factors.   In the ED, pt noted to be hemodynamically stable with VS notable for BP 92/47 and up to 205/83 mmHg, Cr 1.33, Hg 11.6. Imaging studies included CT head and cervical spine without acute findings, left shoulder x-ray with fracture of the left humeral surgical neck and left hip x-ray shows communited intertrochanteric femur fracture. Orthopedic surgeon/Dr. Lyla Glassing was consulted by ED PA and [facility] asked to admit pt for further evaluation. Plan to take pt to OR 05/19/2014.  Major events since admission: 4/27 - taken to OR, developed post op a-fib with HR 110 - 120's, HTN-ive urgency with SBP in 180's, requiring transfer to SDU 4/28 - new onset fever T 100.4, RR 23-32 bpm, SIRS/sepsis work up initiated; still in a-fib, Cr trending up 1.31 --> 1.44; cardiology consulted 4/29 - pt clinically improving, Tmax 99.34F, BP mores stable, pt did not get out of the bed yet, transfer to telemetry unit   Assessment/Plan:    Principal Problem:   Fracture, intertrochanteric, left femur, after an episode of mechanical fall - s/p ORIF,  post op day #2, still week and with poor oral intake, pain 2/10 at this time  - appreciate ortho team assistance - recommendation is to proceed with PT as pt able to tolerate, NWB LUE, TDWB BLE - DVT prophylaxis Lovenox SQ - will need SNF once medically stable for discharge   Active Problems:   SIRS am of 4/28 - criteria for SIRS met 4/28 with T 100.4, HR in 110's, RR 23 - 32 bpm, WBC 11 K - CXR suggestive of lobar PNA, left lower lobe (HCAP considering it happened post op and CXR with no signs of PNA prior to surgery) - UA also suggestive of UTI as well so possible contributor to sepsis  - will continue broad spectrum ABX vancomycin and zosyn until urine and blood cultures are back  - today is day #2 of ABX vancomycin and zosyn  - pt is responding to current regimen, pt remains afebrile, WBC is trending down  - repeat CBC in AM    HTN-ive urgency post op and requiring transfer to SDU 4/27 - BP much better this AM with SBP in 120's - placed on hydralazine scheduled and as needed and will continue the same regimen - also continue Metoprolol as per home medical regimen, 12.5 mg PO BID - d/c Benazepril due to worsening renal insufficiency     Post op atrial fibrillation with RVR 4/27 - transferred to SDU post op - placed on Metoprolol as needed in addition to continuing home medical regimen with Metoprolol 12.5 mg PO BID - currently in NSR, cardiology team consulted and has agreed with current treatment plan with no additional recommendations - pt is  stable for transfer to telemetry unit     Protein-calorie malnutrition, severe - in the context of progressive FTT and deconditioning - nutritionist consulted    Acute on chronic kidney disease stage III - baseline Cr 1.1 - 1.2, now up likely from sepsis and decreased circulatory volume, also possible contribution from vancomycin  - Cr trending up since admission: 1.31 --> 1.44 --> 1.82 - continue to hold Benazepril, dose vancomycin  renally  - continue to hold Lasix that pt takes at home 20 mg PO QD - hopefully we can stop vancomycin in next 24 hours if blood cultures with no growth and urine culture negative  - repeat BMP in AM    Fracture of left humerus - Dr. Onnie Graham recommends nonop treatment in sling, NWB - OT will be requested as well prior to discharge     Acute on Chronic diastolic CHF - last 2 D ECHO 03/2014 with normal EF but grade I diastolic CHF, no need to repeat 2 D ECHO at this time - pt with mild crackles on exam this Am 4/29, takes lasix at home which has been on hold since admission due to elevated Cr  - weight remains stable at 110 lbs with pt says is her baseline weight  - continue to hold lasix but also avoid IVF at this point to avoid worsening pulmonary congestion  - monitor daily weights, strict I/O    Cervical spinal stenosis, noted on CT cervical spine - most notable at C4-C5, C5-C6 - will monitor clinical status and may need to proceed with MRI of the cervical spine for closely evaluation if it becomes clinically indicated     Falls at home - likely secondary to progressive deconditioning - will need PT evaluation prior to discharge     Acute blood loss anemia with Anemia of chronic disease, CKD, IDA - drop in Hg since admission likely from dilutional component from IVF pt has received and some post op acute blood loss anemia  - no signs of active bleeding - repeat CBC in AM and if Hg < 7, will plan on transfusing PRBC    Acute on Chronic respiratory failure secondary to COPD, mild acute on chronic diastolic CHF, lobar PNA (HCAP) - change albuterol to levalbuterol due to a-fib - continue broad spectrum abx vancomycin and zosyn for PNA as noted above  - mild crackles on exam this Am 4/29, as Cr is still elevated will hold off on Lasix but will also avoid IVF as noted above (avoiding pulmonary vascular congestion)     Underweight  - Body mass index is 16.86  DVT prophylaxis - Lovenox  SQ   Code Status: Full.  Family Communication:  plan of care discussed with the patient Disposition Plan: SIRS/sepsis work up in progress, transfer to telemetry bed   IV access:  Peripheral IV  Procedures and diagnostic studies:    Dg Chest 1 View  05/18/2014  No active disease.     Ct Head Wo Contrast  05/18/2014   No skull fracture or intracranial hemorrhage.  Small vessel disease type changes without CT evidence of large acute infarct.  Global atrophy without hydrocephalus.    Ct Cervical Spine Wo Contrast  05/18/2014   No cervical spine fracture.  Degenerative changes cervical spine with spine stenosis and cord flattening most prominent C4-5 and C5-6 level. If cord injury were of high clinical concern, MR imaging could be obtained for further delineation.  T2 superior endplate Schmorl's node deformity/compression fracture (20%  loss of height) of indeterminate age. Acute injury not excluded.     Dg Shoulder Left  05/18/2014  Fracture of the left humeral surgical neck with slight rotation of the proximal humeral head/surgical neck with respect to the adjacent humeral shaft.     Dg Hip Unilat With Pelvis 2-3 Views Left  05/18/2014  Comminuted intertrochanteric femur fracture on the left with varus angulation at the fracture site and avulsion of the lesser trochanter. Postoperative change on the right. Moderate narrowing both hip joints. No dislocation.    Dg Femur Min 2 Views Left  05/19/2014   Well-aligned fracture fragments following ORIF.     Dg Femur Min 2 Views Left  05/18/2014  Comminuted intertrochanteric femur fracture proximally. No dislocation.   Dg Chest 1 View  05/19/2014  Stable COPD and borderline cardiomegaly.  No active lung disease.   Dg Chest View 05/20/2014   Small bilateral pleural effusions, greater of L. Mild appearing left basilar airspace disease could be due to atelectasis or infection.   Medical Consultants:  Ortho  Other Consultants:  PT/OT Nutritionist    IAnti-Infectives:   Vancomycin 4/28 --> Zosyn 4/28 -->  Faye Ramsay, MD  [facility] Pager [phone]  If 7PM-7AM, please contact night-coverage [email or web address] Password [facility] 05/21/2014, 3:25 PM   LOS: 3 days   HPI/Subjective: Still with pain in the left arm and left hip area  Objective: Filed Vitals:   05/21/14 1100 05/21/14 1112 05/21/14 1333 05/21/14 1400  BP: 82/36 100/48 115/40 121/43  Pulse: 77 77 73 69  Temp: 99.5 F (37.5 C) 100.2 F (37.9 C) 99.7 F (37.6 C) 99.7 F (37.6 C)  TempSrc:      Resp: _0 Height:      Weight:      SpO2: 99% 98% 100% 100%    Intake/Output Summary (Last 24 hours) at 05/21/14 1525 Last data filed at 05/21/14 1000  Gross per 24 hour  Intake    440 ml  Output    553 ml  Net   -113 ml    Exam:   General:  Pt is alert, follows commands appropriately, not in acute distress  Cardiovascular: RRR, S1/S2, no rubs, no gallops  Respiratory: Clear to auscultation bilaterally, no wheezing, crackles at bases   Abdomen: Soft, non tender, non distended, bowel sounds present, no guarding  Extremities: TTP in the left hip area, pulses DP and PT palpable bilaterally  Neuro: Grossly nonfocal  Data Reviewed: Basic Metabolic Panel:  Recent Labs Lab 05/18/14 1235 05/19/14 0435 05/19/14 1940 05/20/14 0325 05/21/14 0348  NA 142 143  --  141 142  K 4.2 4.2  --  3.9 3.7  CL 111 113*  --  111 111  CO2 25 23  --  23 24  GLUCOSE 140* 134*  --  131* 127*  BUN 17 18  --  22 31*  CREATININE 1.33* 1.32* 1.31* 1.44* 1.81*  CALCIUM 8.4 8.2*  --  7.7* 7.7*   CBC:  Recent Labs Lab 05/18/14 1235 05/19/14 0435 05/19/14 1940 05/20/14 0325 05/21/14 0348  WBC 7.5 8.1 14.2* 11.4* 10.8*  NEUTROABS 6.2  --   --   --   --   HGB 11.6* 9.4* 9.4* 8.5* 7.5*  HCT 36.3 28.8* 28.9* 25.9* 22.3*  MCV 94.0 92.9 93.5 92.8 91.0  PLT 234 194 207 176 159    Recent Results (from the past 240 hour(s))  Surgical pcr screen     Status: None  Collection Time: 05/18/14 10:05 PM  Result Value Ref Range Status   MRSA, PCR NEGATIVE NEGATIVE Final   Staphylococcus aureus NEGATIVE NEGATIVE Final     Scheduled Meds: . docusate sodium  100 mg Oral BID  . dorzolamide-timolol  1 drop Both Eyes BID  . enoxaparin (LOVENOX) injection  30 mg Subcutaneous Q24H  . feeding supplement (RESOURCE BREEZE)  1 Container Oral TID BM  . hydrALAZINE  10 mg Oral 3 times per day  . metoprolol tartrate  12.5 mg Oral BID  . piperacillin-tazobactam (ZOSYN)  IV  3.375 g Intravenous Q12H  . simvastatin  20 mg Oral q1800  . vancomycin  500 mg Intravenous Q24H   Continuous Infusions:

## 2014-05-21 NOTE — Clinical Documentation Improvement (Signed)
Renal labs as below.  Current chart notes patient with history of CKD.  Please identify any additional clinical conditions associated with the below labs, if any, and document in your progress note and carry over to the discharge summary.    Component      BUN Creatinine  Latest Ref Rng      6 - 23 mg/dL 0.50 - 1.10 mg/dL  05/18/2014      17 1.33 (H)  05/19/2014     4:35 AM 18 1.32 (H)  05/19/2014     7:40 PM  1.31 (H)  05/20/2014     3:25 AM 22 1.44 (H)  05/21/2014      31 (H) 1.81 (H)   Component      EGFR (African American)  Latest Ref Rng      >90 mL/min  05/18/2014      40 (L)  05/19/2014     4:35 AM 40 (L)  05/19/2014     7:40 PM 41 (L)  05/20/2014     3:25 AM 36 (L)  05/21/2014      27 (L)   Possible Clinical Conditions: -Acute kidney injury / acute renal failure on chronic kidney disease -Other condition (please specify) -Unable to determine at present  Thank you, Mateo Flow, RN [phone] Clinical Documentation Specialist

## 2014-05-21 NOTE — Progress Notes (Signed)
ANTIBIOTIC CONSULT NOTE   Pharmacy Consult for Zosyn Indication: rule out sepsis   Plan: 1. Change zosyn to 2.25gm q8h for worsening renal function and reduced urine output.   Juliette Alcideustin Zeigler, PharmD, BCPS.   Pager: [phone] 05/21/2014 9:34 PM

## 2014-05-22 LAB — URINE CULTURE
COLONY COUNT: NO GROWTH
Culture: NO GROWTH

## 2014-05-22 LAB — BASIC METABOLIC PANEL
Anion gap: 9 (ref 5–15)
BUN: 34 mg/dL — ABNORMAL HIGH (ref 6–23)
CO2: 23 mmol/L (ref 19–32)
CREATININE: 1.63 mg/dL — AB (ref 0.50–1.10)
Calcium: 7.6 mg/dL — ABNORMAL LOW (ref 8.4–10.5)
Chloride: 112 mmol/L (ref 96–112)
GFR, EST AFRICAN AMERICAN: 31 mL/min — AB (ref >90)
GFR, EST NON AFRICAN AMERICAN: 27 mL/min — AB (ref >90)
Glucose, Bld: 107 mg/dL — ABNORMAL HIGH (ref 70–99)
Potassium: 3.5 mmol/L (ref 3.5–5.1)
SODIUM: 144 mmol/L (ref 135–145)

## 2014-05-22 LAB — CBC
HCT: 21.3 % — ABNORMAL LOW (ref 36.0–46.0)
Hemoglobin: 6.8 g/dL — CL (ref 12.0–15.0)
MCH: 29.2 pg (ref 26.0–34.0)
MCHC: 31.9 g/dL (ref 30.0–36.0)
MCV: 91.4 fL (ref 78.0–100.0)
PLATELETS: 151 10*3/uL (ref 150–400)
RBC: 2.33 MIL/uL — AB (ref 3.87–5.11)
RDW: 15.3 % (ref 11.5–15.5)
WBC: 8.6 10*3/uL (ref 4.0–10.5)

## 2014-05-22 LAB — PREPARE RBC (CROSSMATCH)

## 2014-05-22 MED ORDER — ASPIRIN 325 MG PO TABS
325.0000 mg | ORAL_TABLET | Freq: Every day | ORAL | Status: DC
  Administered 2014-05-22 – 2014-05-23 (×2): 325 mg via ORAL
  Filled 2014-05-22 (×3): qty 1

## 2014-05-22 MED ORDER — LEVOFLOXACIN 500 MG PO TABS
500.0000 mg | ORAL_TABLET | ORAL | Status: DC
  Administered 2014-05-24: 500 mg via ORAL
  Filled 2014-05-22: qty 1

## 2014-05-22 MED ORDER — LEVOFLOXACIN 750 MG PO TABS
750.0000 mg | ORAL_TABLET | Freq: Once | ORAL | Status: AC
  Administered 2014-05-22: 750 mg via ORAL
  Filled 2014-05-22: qty 1

## 2014-05-22 MED ORDER — SODIUM CHLORIDE 0.9 % IV SOLN: Freq: Once | INTRAVENOUS | Status: DC

## 2014-05-22 MED ORDER — SODIUM CHLORIDE 0.9 % IV SOLN
Freq: Once | INTRAVENOUS | Status: AC
  Administered 2014-05-22: 13:00:00 via INTRAVENOUS

## 2014-05-22 NOTE — Progress Notes (Signed)
Patient has 16 beats run of Vtach. Pt asymptomatic. On call made aware, no new order received. Will monitor pt closely.

## 2014-05-22 NOTE — Progress Notes (Signed)
ANTIBIOTIC CONSULT NOTE   Pharmacy Consult for Levaquin  Indication: rule out sepsis, HCAP  No Known Allergies  Patient Measurements: Height: 5\' 4"  (162.6 cm) Weight: 110 lb 14.3 oz (50.3 kg) IBW/kg (Calculated) : 54.7 Adjusted Body Weight:   Vital Signs: Temp: 98.5 F (36.9 C) (04/30 1035) Temp Source: Oral (04/30 1035) BP: 114/53 mmHg (04/30 1035) Pulse Rate: 76 (04/30 1035) Intake/Output from previous day: 04/29 0701 - 04/30 0700 In: 250 [IV Piggyback:250] Out: 530 [Urine:530] Intake/Output from this shift: Total I/O In: 30 [Blood:30] Out: -   Labs:  Recent Labs  05/20/14 0325 05/21/14 0348 05/22/14 0418  WBC 11.4* 10.8* 8.6  HGB 8.5* 7.5* 6.8*  PLT 176 159 151  CREATININE 1.44* 1.81* 1.63*   Estimated Creatinine Clearance: 18.6 mL/min (by C-G formula based on Cr of 1.63). No results for input(s): VANCOTROUGH, VANCOPEAK, VANCORANDOM, GENTTROUGH, GENTPEAK, GENTRANDOM, TOBRATROUGH, TOBRAPEAK, TOBRARND, AMIKACINPEAK, AMIKACINTROU, AMIKACIN in the last 72 hours.   Microbiology: Recent Results (from the past 720 hour(s))  Surgical pcr screen     Status: None   Collection Time: 05/18/14 10:05 PM  Result Value Ref Range Status   MRSA, PCR NEGATIVE NEGATIVE Final   Staphylococcus aureus NEGATIVE NEGATIVE Final    Comment:        The Xpert SA Assay (FDA approved for NASAL specimens in patients over 79 years of age), is one component of a comprehensive surveillance program.  Test performance has been validated by [facility] for patients greater than or equal to 79 year old. It is not intended to diagnose infection nor to guide or monitor treatment.   Culture, blood (x 2)     Status: None (Preliminary result)   Collection Time: 05/20/14 10:00 AM  Result Value Ref Range Status   Specimen Description BLOOD RIGHT ARM  Final   Special Requests BOTTLES DRAWN AEROBIC ONLY 2CC  Final   Culture   Final           BLOOD CULTURE RECEIVED NO GROWTH TO DATE CULTURE  WILL BE HELD FOR 5 DAYS BEFORE ISSUING A FINAL NEGATIVE REPORT Note: Culture results may be compromised due to an inadequate volume of blood received in culture bottles. Performed at [facility]    Report Status PENDING  Incomplete  Culture, blood (x 2)     Status: None (Preliminary result)   Collection Time: 05/20/14 10:02 AM  Result Value Ref Range Status   Specimen Description BLOOD RIGHT HAND  Final   Special Requests BOTTLES DRAWN AEROBIC AND ANAEROBIC 5CC  Final   Culture   Final           BLOOD CULTURE RECEIVED NO GROWTH TO DATE CULTURE WILL BE HELD FOR 5 DAYS BEFORE ISSUING A FINAL NEGATIVE REPORT Performed at [facility]    Report Status PENDING  Incomplete  Urine culture     Status: None   Collection Time: 05/20/14 11:32 AM  Result Value Ref Range Status   Specimen Description URINE, CATHETERIZED  Final   Special Requests NONE  Final   Colony Count NO GROWTH Performed at [facility]   Final   Culture NO GROWTH Performed at [facility]   Final   Report Status 05/22/2014 FINAL  Final    Medical History: Past Medical History  Diagnosis Date  . Hypertension   . Hyperlipidemia   . Gout   . Osteoporosis   . CKD (chronic kidney disease) stage 4, GFR 15-29 ml/min   .  Glaucoma   . Vitamin D deficiency     Medications:  Anti-infectives    Start     Dose/Rate Route Frequency Ordered Stop   05/21/14 2200  piperacillin-tazobactam (ZOSYN) IVPB 2.25 g  Status:  Discontinued     2.25 g 100 mL/hr over 30 Minutes Intravenous 3 times per day 05/21/14 2113 05/22/14 1139   05/21/14 1600  piperacillin-tazobactam (ZOSYN) IVPB 3.375 g  Status:  Discontinued     3.375 g 12.5 mL/hr over 240 Minutes Intravenous Every 12 hours 05/21/14 0826 05/21/14 2113   05/21/14 0800  vancomycin (VANCOCIN) 500 mg in sodium chloride 0.9 % 100 mL IVPB  Status:  Discontinued     500 mg 100 mL/hr over 60 Minutes Intravenous Every 24 hours 05/20/14 1018 05/22/14  1139   05/20/14 1800  piperacillin-tazobactam (ZOSYN) IVPB 3.375 g  Status:  Discontinued     3.375 g 12.5 mL/hr over 240 Minutes Intravenous Every 8 hours 05/20/14 1018 05/21/14 0826   05/20/14 1000  piperacillin-tazobactam (ZOSYN) IVPB 3.375 g     3.375 g 100 mL/hr over 30 Minutes Intravenous STAT 05/20/14 0927 05/20/14 1049   05/20/14 1000  vancomycin (VANCOCIN) IVPB 1000 mg/200 mL premix     1,000 mg 200 mL/hr over 60 Minutes Intravenous STAT 05/20/14 0927 05/20/14 1119   05/19/14 2000  ceFAZolin (ANCEF) IVPB 2 g/50 mL premix     2 g 100 mL/hr over 30 Minutes Intravenous Every 6 hours 05/19/14 1831 05/20/14 0232   05/19/14 1245  ceFAZolin (ANCEF) IVPB 2 g/50 mL premix     2 g 100 mL/hr over 30 Minutes Intravenous  Once 05/19/14 1231 05/19/14 1253     Assessment: 81yo F recently underwent R femur fx repair, fell at home and fractured her L femur and L humerus. On 4/27 she underwent surgical repair of L femur fracture(L humerus in a sling). Post-op she became hypertensive and went into A.fib w/ RVR. Vanc and Zosyn ordered per pharmacy for SIRS/sepsis. Source unknown.  4/28 >> Zosyn >> 4/30 4/28 >> Vanc >>  4/30 4/30 >> Levaquin >>   4/28 blood: pending MRSA PCR negative   Goal of Therapy:  Appropriate antibiotic dosing for renal function; eradication of infection  Plan:  Levaquin  PO x 1 then  PO q48 per current renal function   Hessie Knows, PharmD, BCPS Pager [phone] 05/22/2014 11:45 AM

## 2014-05-22 NOTE — Progress Notes (Signed)
CRITICAL VALUE ALERT  Critical value received:  HGB 6.8  Date of notification:  05/22/14  Time of notification:  0500  Critical value read back:Yes.    Nurse who received alert:  Mercer PodLUTTERLOH, Hogan Hoobler D   MD notified (1st page):  NP Lynch  Time of first page:  0520  MD notified (2nd page):  Time of second page:  Responding MD:    Time MD responded:

## 2014-05-22 NOTE — Progress Notes (Signed)
Dr. Izola PriceMyers return page. No new orders received. Pt RN, Nehemiah SettleBrooke also made aware of situation. Will continue to closely  monitor.

## 2014-05-22 NOTE — Progress Notes (Signed)
Patient given prn metoprolol. HR in 70's, NSR

## 2014-05-22 NOTE — Progress Notes (Signed)
Pt having 10-16 beats of SVT every 1-2 minutes. Pt asymptomatic. VSS. Dr. Izola PriceMyers paged 2 times. Awaiting return call. Will continue to monitor.

## 2014-05-22 NOTE — Progress Notes (Signed)
Patient ID: Kimberly Avery, female   DOB: 04-Apr-1923, 81 y.o.   MRN: [id number]  [facility] PROGRESS NOTE  Kimberly Avery GMW:[id number] DOB: March 17, 1943 DOA: 05/18/2014 PCP: Marjorie Smolder, MD   Brief narrative:    81 y.o. female with essential hypertension, HLD, stage III chronic kidney disease, hospitalized 04/17/14-04/23/14 for sepsis secondary to pyelonephritis, severe hyponatremia (Na 106), acute blood loss anemia, elevated troponin from demand ischemia in the setting of severe illness, acute encephalopathy and intertrochanteric fracture of right hip for which she underwent medullary nail fixation, now presented with left shoulder and lower extremity pain following mechanical fall at home. She described pain as sharp and stabbing, intermittent and 10/10 in severity when present, non radiating, worse with even minimal movement with no specific alleviating factors.   In the ED, pt noted to be hemodynamically stable with VS notable for BP 92/47 and up to 205/83 mmHg, Cr 1.33, Hg 11.6. Imaging studies included CT head and cervical spine without acute findings, left shoulder x-ray with fracture of the left humeral surgical neck and left hip x-ray shows communited intertrochanteric femur fracture. Orthopedic surgeon/Dr. Lyla Glassing was consulted by ED PA and [facility] asked to admit pt for further evaluation. Plan to take pt to OR 05/19/2014.  Major events since admission: 4/27 - taken to OR, developed post op a-fib with HR 110 - 120's, HTN-ive urgency with SBP in 180's, requiring transfer to SDU 4/28 - new onset fever T 100.4, RR 23-32 bpm, SIRS/sepsis work up initiated; still in a-fib, Cr trending up 1.31 --> 1.44; cardiology consulted 4/29 - pt clinically improving, Tmax 99.83F, BP mores stable, pt did not get out of the bed yet, transfer to telemetry unit  4/20 - Hg drop down to 6.8, 2 U PRBC requested for transfusion   Assessment/Plan:    Principal Problem:   Fracture,  intertrochanteric, left femur, after an episode of mechanical fall - s/p ORIF, post op day #3, still week but has not gotten up yet - appreciate ortho team assistance - recommendation is to proceed with PT as pt able to tolerate, NWB LUE, TDWB BLE - DVT prophylaxis --> was on Lovenox since surgery but due to persistent drop in Hg will change to SCD's for now, also add aspirin  - attempt PT today  - will need SNF once medically stable for discharge   Active Problems:   SIRS am of 4/28 - criteria for SIRS met 4/28 with T 100.4, HR in 110's, RR 23 - 32 bpm, WBC 11 K - CXR suggestive of lobar PNA, left lower lobe (HCAP considering it happened post op and CXR with no signs of PNA prior to surgery) - UA also suggestive of UTI but urine culture with no growth  - today is day #3 of ABX vancomycin and zosyn and since sepsis etiology is resolving, WBC WNL, patient is afebrile, change antibiotic to Levaquin 4/30 - repeat CBC in AM    HTN-ive urgency post op and requiring transfer to SDU 4/27 - BP much better this AM with SBP in 110's - placed on hydralazine scheduled and as needed and will continue the same regimen - also continue Metoprolol as per home medical regimen, 12.5 mg PO BID - d/c Benazepril due to worsening renal insufficiency     Post op atrial fibrillation with RVR 4/27 - transferred to SDU post op - placed on Metoprolol as needed in addition to continuing home medical regimen with Metoprolol 12.5 mg PO BID - currently in NSR,  cardiology team consulted and has agreed with current treatment plan with no additional recommendations - If no further events on telemetry - if no events on telemetry in next 24 hours, plan on discontinuing telemetry monitor    Protein-calorie malnutrition, severe - in the context of progressive FTT and deconditioning - nutritionist consulted    Acute on chronic kidney disease stage III - baseline Cr 1.1 - 1.2, now up likely from sepsis and decreased  circulatory volume, also possible contribution from vancomycin  - Cr trending since admission: 1.31 --> 1.44 --> 1.82 --> 1.63 - continue to hold Benazepril, stop vancomycin, change ABX to Levaquin  - continue to hold Lasix that pt takes at home 20 mg PO QD - repeat BMP in AM    Fracture of left humerus - Dr. Onnie Graham recommends nonop treatment in sling, NWB - OT will be requested as well prior to discharge     Acute on Chronic diastolic CHF - last 2 D ECHO 03/2014 with normal EF but grade I diastolic CHF, no need to repeat 2 D ECHO at this time - pt with mild crackles on exam this Am 4/29, takes lasix at home which has been on hold since admission due to elevated Cr  - weight remains stable at 110 lbs with pt says is her baseline weight, awaiting weight check this AM - continue to hold lasix but also avoid IVF at this point to avoid worsening pulmonary congestion  - monitor daily weights, strict I/O    Cervical spinal stenosis, noted on CT cervical spine - most notable at C4-C5, C5-C6 - will monitor clinical status and may need to proceed with MRI of the cervical spine for closely evaluation if it becomes clinically indicated     Falls at home - likely secondary to progressive deconditioning - will need PT evaluation prior to discharge     Acute blood loss anemia with Anemia of chronic disease, CKD, IDA - drop in Hg since admission likely from dilutional component from IVF pt has received and some post op acute blood loss anemia  - Hg 6.8 this AM  - plan on transfusing 2 U PRBC    Acute on Chronic respiratory failure secondary to COPD, mild acute on chronic diastolic CHF, lobar PNA (HCAP) - changed albuterol to levalbuterol due to a-fib - continue ABX for PNA as noted above  - avoid IVF as noted above (avoiding pulmonary vascular congestion)     Underweight  - Body mass index is 16.86  DVT prophylaxis - change from Lovenox SQ to SCD's 4/30 due to drop in Hg, add aspirin   Code  Status: Full.  Family Communication:  plan of care discussed with the patient Disposition Plan: Requires blood transfusion   IV access:  Peripheral IV  Procedures and diagnostic studies:    Dg Chest 1 View  05/18/2014  No active disease.     Ct Head Wo Contrast  05/18/2014   No skull fracture or intracranial hemorrhage.  Small vessel disease type changes without CT evidence of large acute infarct.  Global atrophy without hydrocephalus.    Ct Cervical Spine Wo Contrast  05/18/2014   No cervical spine fracture.  Degenerative changes cervical spine with spine stenosis and cord flattening most prominent C4-5 and C5-6 level. If cord injury were of high clinical concern, MR imaging could be obtained for further delineation.  T2 superior endplate Schmorl's node deformity/compression fracture (20% loss of height) of indeterminate age. Acute injury not excluded.  Dg Shoulder Left  05/18/2014  Fracture of the left humeral surgical neck with slight rotation of the proximal humeral head/surgical neck with respect to the adjacent humeral shaft.     Dg Hip Unilat With Pelvis 2-3 Views Left  05/18/2014  Comminuted intertrochanteric femur fracture on the left with varus angulation at the fracture site and avulsion of the lesser trochanter. Postoperative change on the right. Moderate narrowing both hip joints. No dislocation.    Dg Femur Min 2 Views Left  05/19/2014   Well-aligned fracture fragments following ORIF.     Dg Femur Min 2 Views Left  05/18/2014  Comminuted intertrochanteric femur fracture proximally. No dislocation.   Dg Chest 1 View  05/19/2014  Stable COPD and borderline cardiomegaly.  No active lung disease.   Dg Chest View 05/20/2014   Small bilateral pleural effusions, greater of L. Mild appearing left basilar airspace disease could be due to atelectasis or infection.   Medical Consultants:  Ortho  Other Consultants:  PT/OT Nutritionist   IAnti-Infectives:   Vancomycin 4/28 --> 4/30   Zosyn 4/28 --> 4/30 Levaquin 4/30 -->  Kimberly Avery, Rebecca Eaton, MD  [facility] Pager [phone]  If 7PM-7AM, please contact night-coverage [email or web address] Password [email or web address] 05/22/2014, 11:14 AM   LOS: 4 days   HPI/Subjective: Feels week and hungry this AM  Objective: Filed Vitals:   05/22/14 0400 05/22/14 0532 05/22/14 0959 05/22/14 1035  BP:  105/41 116/36 114/53  Pulse:  70 75 76  Temp:  98.5 F (36.9 C) 98.9 F (37.2 C) 98.5 F (36.9 C)  TempSrc:  Oral Oral Oral  Resp: 20 20 18 18   Height:      Weight:      SpO2: 100% 100% 100% 100%    Intake/Output Summary (Last 24 hours) at 05/22/14 1114 Last data filed at 05/22/14 1020  Gross per 24 hour  Intake    180 ml  Output    380 ml  Net   -200 ml    Exam:   General:  Pt is alert, follows commands appropriately, not in acute distress  Cardiovascular: RRR, S1/S2, no rubs, no gallops  Respiratory: Clear to auscultation bilaterally, no wheezing, diminished breath sounds at bases   Abdomen: Soft, non tender, non distended, bowel sounds present, no guarding  Extremities: TTP in the left hip area, pulses DP and PT palpable bilaterally  Neuro: Grossly nonfocal  Data Reviewed: Basic Metabolic Panel:  Recent Labs Lab 05/18/14 1235 05/19/14 0435 05/19/14 1940 05/20/14 0325 05/21/14 0348 05/22/14 0418  NA 142 143  --  141 142 144  K 4.2 4.2  --  3.9 3.7 3.5  CL 111 113*  --  111 111 112  CO2 25 23  --  23 24 23   GLUCOSE 140* 134*  --  131* 127* 107*  BUN 17 18  --  22 31* 34*  CREATININE 1.33* 1.32* 1.31* 1.44* 1.81* 1.63*  CALCIUM 8.4 8.2*  --  7.7* 7.7* 7.6*   CBC:  Recent Labs Lab 05/18/14 1235 05/19/14 0435 05/19/14 1940 05/20/14 0325 05/21/14 0348 05/22/14 0418  WBC 7.5 8.1 14.2* 11.4* 10.8* 8.6  NEUTROABS 6.2  --   --   --   --   --   HGB 11.6* 9.4* 9.4* 8.5* 7.5* 6.8*  HCT 36.3 28.8* 28.9* 25.9* 22.3* 21.3*  MCV 94.0 92.9 93.5 92.8 91.0 91.4  PLT 234 194 207 176 159 151    Recent Results (from the  past 240 hour(s))  Surgical pcr screen  Status: None   Collection Time: 05/18/14 10:05 PM  Result Value Ref Range Status   MRSA, PCR NEGATIVE NEGATIVE Final   Staphylococcus aureus NEGATIVE NEGATIVE Final     Scheduled Meds: . sodium chloride   Intravenous Once  . docusate sodium  100 mg Oral BID  . dorzolamide-timolol  1 drop Both Eyes BID  . enoxaparin (LOVENOX) injection  30 mg Subcutaneous Q24H  . feeding supplement (RESOURCE BREEZE)  1 Container Oral TID BM  . hydrALAZINE  10 mg Oral 3 times per day  . metoprolol tartrate  12.5 mg Oral BID  . piperacillin-tazobactam (ZOSYN)  IV  2.25 g Intravenous 3 times per day  . simvastatin  20 mg Oral q1800  . vancomycin  500 mg Intravenous Q24H   Continuous Infusions:

## 2014-05-22 NOTE — Progress Notes (Signed)
    Subjective: 3 Days Post-Op Procedure(s) (LRB): IM NAIL LEFT HIP (Left) Patient reports pain as 3 on 0-10 scale.   Denies CP or SOB.  Voiding without difficulty. Positive flatus. Objective: Vital signs in last 24 hours: Temp:  [97.8 F (36.6 C)-100.2 F (37.9 C)] 98.5 F (36.9 C) (04/30 0532) Pulse Rate:  [69-84] 70 (04/30 0532) Resp:  [18-25] 20 (04/30 0532) BP: (81-121)/(25-61) 105/41 mmHg (04/30 0532) SpO2:  [92 %-100 %] 100 % (04/30 0532)  Intake/Output from previous day: 04/29 0701 - 04/30 0700 In: 250 [IV Piggyback:250] Out: 530 [Urine:530] Intake/Output this shift:    Labs:  Recent Labs  05/19/14 1940 05/20/14 0325 05/21/14 0348 05/22/14 0418  HGB 9.4* 8.5* 7.5* 6.8*    Recent Labs  05/21/14 0348 05/22/14 0418  WBC 10.8* 8.6  RBC 2.45* 2.33*  HCT 22.3* 21.3*  PLT 159 151    Recent Labs  05/21/14 0348 05/22/14 0418  NA 142 144  K 3.7 3.5  CL 111 112  CO2 24 23  BUN 31* 34*  CREATININE 1.81* 1.63*  GLUCOSE 127* 107*  CALCIUM 7.7* 7.6*    Recent Labs  05/20/14 1330  INR 1.29    Physical Exam: Neurologically intact Intact pulses distally Dorsiflexion/Plantar flexion intact Incision: dressing C/D/I and no drainage Compartment soft  Assessment/Plan: 3 Days Post-Op Procedure(s) (LRB): IM NAIL LEFT HIP (Left) Advance diet Up with therapy  HCT remain low - transfusion recommendation per Medical team Stable from ortho standpoint.  Venita LickBROOKS,Mekiah [place] D for Dr. Venita Lickahari [facility] [phone] 05/22/2014, 8:45 AM

## 2014-05-23 LAB — BASIC METABOLIC PANEL
Anion gap: 5 (ref 5–15)
BUN: 30 mg/dL — ABNORMAL HIGH (ref 6–20)
CALCIUM: 7.7 mg/dL — AB (ref 8.9–10.3)
CO2: 21 mmol/L — AB (ref 22–32)
CREATININE: 1.4 mg/dL — AB (ref 0.44–1.00)
Chloride: 114 mmol/L — ABNORMAL HIGH (ref 101–111)
GFR calc non Af Amer: 32 mL/min — ABNORMAL LOW (ref >60)
GFR, EST AFRICAN AMERICAN: 37 mL/min — AB (ref >60)
Glucose, Bld: 115 mg/dL — ABNORMAL HIGH (ref 70–99)
Potassium: 3.9 mmol/L (ref 3.5–5.1)
Sodium: 140 mmol/L (ref 135–145)

## 2014-05-23 LAB — CBC
HCT: 31.8 % — ABNORMAL LOW (ref 36.0–46.0)
Hemoglobin: 10.8 g/dL — ABNORMAL LOW (ref 12.0–15.0)
MCH: 29.4 pg (ref 26.0–34.0)
MCHC: 34 g/dL (ref 30.0–36.0)
MCV: 86.6 fL (ref 78.0–100.0)
PLATELETS: 179 10*3/uL (ref 150–400)
RBC: 3.67 MIL/uL — AB (ref 3.87–5.11)
RDW: 16 % — ABNORMAL HIGH (ref 11.5–15.5)
WBC: 8.1 10*3/uL (ref 4.0–10.5)

## 2014-05-23 MED ORDER — SIMETHICONE 40 MG/0.6ML PO SUSP
40.0000 mg | ORAL | Status: DC | PRN
  Administered 2014-05-23 – 2014-05-24 (×3): 40 mg via ORAL
  Filled 2014-05-23 (×5): qty 0.6

## 2014-05-23 NOTE — Progress Notes (Signed)
     Subjective: 4 Days Post-Op Procedure(s) (LRB): IM NAIL LEFT HIP (Left)   Patient reports pain as mild, pain controlled. No events throughout the night. Doesn't really feel much different from yesterday.  Objective:   VITALS:   Filed Vitals:   05/23/14 0453  BP: 125/57  Pulse: 70  Temp: 98.7 F (37.1 C)  Resp: 18    Dorsiflexion/Plantar flexion intact Incision: dressing C/D/I No cellulitis present Compartment soft  LABS  Recent Labs  05/21/14 0348 05/22/14 0418 05/23/14 0536  HGB 7.5* 6.8* 10.8*  HCT 22.3* 21.3* 31.8*  WBC 10.8* 8.6 8.1  PLT 159 151 179     Recent Labs  05/21/14 0348 05/22/14 0418 05/23/14 0536  NA 142 144 140  K 3.7 3.5 3.9  BUN 31* 34* 30*  CREATININE 1.81* 1.63* 1.40*  GLUCOSE 127* 107* 115*     Assessment/Plan: 4 Days Post-Op Procedure(s) (LRB): IM NAIL LEFT HIP (Left)  Up with therapy Stable from ortho standpoint.    Anastasio AuerbachMatthew S. Susa Bones   PAC  05/23/2014, 8:05 AM

## 2014-05-23 NOTE — Progress Notes (Signed)
Patient ID: Kimberly Avery, female   DOB: March 21, 1923, 81 y.o.   MRN: [id number]  [facility] PROGRESS NOTE  STEPHONIE WILCOXEN ZCH:[id number] DOB: 10/19/1923 DOA: 05/18/2014 PCP: Marjorie Smolder, MD   Brief narrative:    81 y.o. female with essential hypertension, HLD, stage III chronic kidney disease, hospitalized 04/17/14-04/23/14 for sepsis secondary to pyelonephritis, severe hyponatremia (Na 106), acute blood loss anemia, elevated troponin from demand ischemia in the setting of severe illness, acute encephalopathy and intertrochanteric fracture of right hip for which she underwent medullary nail fixation, now presented with left shoulder and lower extremity pain following mechanical fall at home. She described pain as sharp and stabbing, intermittent and 10/10 in severity when present, non radiating, worse with even minimal movement with no specific alleviating factors.   In the ED, pt noted to be hemodynamically stable with VS notable for BP 92/47 and up to 205/83 mmHg, Cr 1.33, Hg 11.6. Imaging studies included CT head and cervical spine without acute findings, left shoulder x-ray with fracture of the left humeral surgical neck and left hip x-ray shows communited intertrochanteric femur fracture. Orthopedic surgeon/Dr. Lyla Glassing was consulted by ED PA and [facility] asked to admit pt for further evaluation. Plan to take pt to OR 05/19/2014.  Major events since admission: 4/27 - taken to OR, developed post op a-fib with HR 110 - 120's, HTN-ive urgency with SBP in 180's, requiring transfer to SDU 4/28 - new onset fever T 100.4, RR 23-32 bpm, SIRS/sepsis work up initiated; still in a-fib, Cr trending up 1.31 --> 1.44; cardiology consulted 4/29 - pt clinically improving, Tmax 99.34F, BP mores stable, pt did not get out of the bed yet, transfer to telemetry unit  4/20 - Hg drop down to 6.8, 2 U PRBC requested for transfusion   Assessment/Plan:    Principal Problem:   Fracture,  intertrochanteric, left femur, after an episode of mechanical fall - s/p ORIF, post op day #4, still week but has not gotten up yet - appreciate ortho team assistance - recommendation is to proceed with PT as pt able to tolerate, NWB LUE, TDWB BLE - DVT prophylaxis --> was on Lovenox since surgery but due to persistent drop in Hg will change to SCD's for now, also add aspirin  - attempt PT today, pt reports she is ready to start working out  - will need SNF once medically stable for discharge   Active Problems:   SIRS am of 4/28 - criteria for SIRS met 4/28 with T 100.4, HR in 110's, RR 23 - 32 bpm, WBC 11 K - CXR suggestive of lobar PNA, left lower lobe (HCAP considering it happened post op and CXR with no signs of PNA prior to surgery) - UA also suggestive of UTI but urine culture with no growth  - sepsis etiology resolving  - pt has completed three days of vancomycin and zosyn, has been on Levaquin since 4/30,continue Levaquin  - repeat CBC in AM    HTN-ive urgency post op and requiring transfer to SDU 4/27 - BP much better this AM with SBP in 120's - placed on hydralazine scheduled and as needed and will continue the same regimen - also continue Metoprolol as per home medical regimen, 12.5 mg PO BID - d/c Benazepril due to renal insufficiency     Post op atrial fibrillation with RVR 4/27 - transferred to SDU post op - placed on Metoprolol as needed in addition to continuing home medical regimen with Metoprolol 12.5 mg PO  BID - currently in NSR, cardiology team consulted and has agreed with current treatment plan with no additional recommendations - pt with no chest pain or shortness of breath this AM  - d/c telemetry     Protein-calorie malnutrition, severe - in the context of progressive FTT and deconditioning - nutritionist consulted - pt tolerating diet well     Acute on chronic kidney disease stage III - baseline Cr 1.1 - 1.2, now up likely from sepsis and decreased  circulatory volume, also possible contribution from vancomycin  - Cr trending since admission: 1.31 --> 1.44 --> 1.82 --> 1.63 --> 1.40 this AM - continue to hold Benazepril until renal function stabilizes  - continue to hold Lasix that pt takes at home 20 mg PO QD - repeat BMP in AM    Fracture of left humerus - Dr. Onnie Graham recommends nonop treatment in sling, NWB - OT will be requested as well prior to discharge     Acute on Chronic diastolic CHF - last 2 D ECHO 03/2014 with normal EF but grade I diastolic CHF, no need to repeat 2 D ECHO at this time - pt with mild crackles on exam Am 4/29, takes lasix at home which has been on hold since admission due to elevated Cr  - weight remains stable at 110 lbs with pt says is her baseline weight - continue to hold lasix and continue to avoid IVF at this point to avoid worsening pulmonary congestion  - monitor daily weights, strict I/O    Cervical spinal stenosis, noted on CT cervical spine - most notable at C4-C5, C5-C6 - will monitor clinical status and may need to proceed with MRI of the cervical spine for closely evaluation if it becomes clinically indicated     Falls at home - likely secondary to progressive deconditioning - will need PT evaluation prior to discharge     Acute blood loss anemia with Anemia of chronic disease, CKD, IDA - drop in Hg since admission likely from dilutional component from IVF pt has received and some post op acute blood loss anemia  - Hg 6.8 on 4/30 --> 10.8 (s/p 2 U PRBC transfusion 4/30) - repeat CBC in AM    Acute on Chronic respiratory failure secondary to COPD, mild acute on chronic diastolic CHF, lobar PNA (HCAP) - changed albuterol to levalbuterol due to a-fib - continue ABX for PNA as noted above  - avoid IVF as noted above (avoiding pulmonary vascular congestion)     Underweight  - Body mass index is 16.86  DVT prophylaxis - change from Lovenox SQ to SCD's 4/30 due to drop in Hg, added aspirin    Code Status: Full.  Family Communication:  plan of care discussed with the patient Disposition Plan: Plan d/c SNF in AM  IV access:  Peripheral IV  Procedures and diagnostic studies:    Dg Chest 1 View  05/18/2014  No active disease.     Ct Head Wo Contrast  05/18/2014   No skull fracture or intracranial hemorrhage.  Small vessel disease type changes without CT evidence of large acute infarct.  Global atrophy without hydrocephalus.    Ct Cervical Spine Wo Contrast  05/18/2014   No cervical spine fracture.  Degenerative changes cervical spine with spine stenosis and cord flattening most prominent C4-5 and C5-6 level. If cord injury were of high clinical concern, MR imaging could be obtained for further delineation.  T2 superior endplate Schmorl's node deformity/compression fracture (20% loss of  height) of indeterminate age. Acute injury not excluded.     Dg Shoulder Left  05/18/2014  Fracture of the left humeral surgical neck with slight rotation of the proximal humeral head/surgical neck with respect to the adjacent humeral shaft.     Dg Hip Unilat With Pelvis 2-3 Views Left  05/18/2014  Comminuted intertrochanteric femur fracture on the left with varus angulation at the fracture site and avulsion of the lesser trochanter. Postoperative change on the right. Moderate narrowing both hip joints. No dislocation.    Dg Femur Min 2 Views Left  05/19/2014   Well-aligned fracture fragments following ORIF.     Dg Femur Min 2 Views Left  05/18/2014  Comminuted intertrochanteric femur fracture proximally. No dislocation.   Dg Chest 1 View  05/19/2014  Stable COPD and borderline cardiomegaly.  No active lung disease.   Dg Chest View 05/20/2014   Small bilateral pleural effusions, greater of L. Mild appearing left basilar airspace disease could be due to atelectasis or infection.   Medical Consultants:  Ortho  Other Consultants:  PT/OT Nutritionist   IAnti-Infectives:   Vancomycin 4/28 --> 4/30   Zosyn 4/28 --> 4/30 Levaquin 4/30 -->  MAGICK-Kayelyn Lemon, Rebecca Eaton, MD  [facility] Pager [phone]  If 7PM-7AM, please contact night-coverage [email or web address] Password [email or web address] 05/23/2014, 12:09 PM   LOS: 5 days   HPI/Subjective: Feels better this AM, wants to start PT today.   Objective: Filed Vitals:   05/23/14 0035 05/23/14 0415 05/23/14 0453 05/23/14 0457  BP:   125/57   Pulse:   70   Temp:   98.7 F (37.1 C)   TempSrc:   Oral   Resp: 16 16 18    Height:      Weight:    50.2 kg (110 lb 10.7 oz)  SpO2: 100% 100% 100%     Intake/Output Summary (Last 24 hours) at 05/23/14 1209 Last data filed at 05/23/14 0932  Gross per 24 hour  Intake   1910 ml  Output    800 ml  Net   1110 ml    Exam:   General:  Pt is alert, follows commands appropriately, not in acute distress  Cardiovascular: RRR, S1/S2, no rubs, no gallops  Respiratory: Clear to auscultation bilaterally, no wheezing, diminished breath sounds at bases   Abdomen: Soft, non tender, non distended, bowel sounds present, no guarding  Extremities: TTP in the left hip area but much better overall, pulses DP and PT palpable bilaterally  Neuro: Grossly nonfocal  Data Reviewed: Basic Metabolic Panel:  Recent Labs Lab 05/19/14 0435 05/19/14 1940 05/20/14 0325 05/21/14 0348 05/22/14 0418 05/23/14 0536  NA 143  --  141 142 144 140  K 4.2  --  3.9 3.7 3.5 3.9  CL 113*  --  111 111 112 114*  CO2 23  --  23 24 23  21*  GLUCOSE 134*  --  131* 127* 107* 115*  BUN 18  --  22 31* 34* 30*  CREATININE 1.32* 1.31* 1.44* 1.81* 1.63* 1.40*  CALCIUM 8.2*  --  7.7* 7.7* 7.6* 7.7*   CBC:  Recent Labs Lab 05/18/14 1235  05/19/14 1940 05/20/14 0325 05/21/14 0348 05/22/14 0418 05/23/14 0536  WBC 7.5  < > 14.2* 11.4* 10.8* 8.6 8.1  NEUTROABS 6.2  --   --   --   --   --   --   HGB 11.6*  < > 9.4* 8.5* 7.5* 6.8* 10.8*  HCT 36.3  < > 28.9* 25.9* 22.3* 21.3* 31.8*  MCV 94.0  < > 93.5 92.8 91.0 91.4 86.6  PLT 234  < > 207 176 159 151  179  < > = values in this interval not displayed.  Recent Results (from the past 240 hour(s))  Surgical pcr screen     Status: None   Collection Time: 05/18/14 10:05 PM  Result Value Ref Range Status   MRSA, PCR NEGATIVE NEGATIVE Final   Staphylococcus aureus NEGATIVE NEGATIVE Final     Scheduled Meds: . sodium chloride   Intravenous Once  . aspirin  325 mg Oral Daily  . docusate sodium  100 mg Oral BID  . dorzolamide-timolol  1 drop Both Eyes BID  . feeding supplement (RESOURCE BREEZE)  1 Container Oral TID BM  . hydrALAZINE  10 mg Oral 3 times per day  . [START ON 05/24/2014] levofloxacin  500 mg Oral Q48H  . metoprolol tartrate  12.5 mg Oral BID  . simvastatin  20 mg Oral q1800   Continuous Infusions:

## 2014-05-24 DIAGNOSIS — N179 Acute kidney failure, unspecified: Secondary | ICD-10-CM | POA: Clinically undetermined

## 2014-05-24 DIAGNOSIS — I160 Hypertensive urgency: Secondary | ICD-10-CM | POA: Diagnosis not present

## 2014-05-24 DIAGNOSIS — I10 Essential (primary) hypertension: Secondary | ICD-10-CM

## 2014-05-24 DIAGNOSIS — D62 Acute posthemorrhagic anemia: Secondary | ICD-10-CM

## 2014-05-24 DIAGNOSIS — N189 Chronic kidney disease, unspecified: Secondary | ICD-10-CM

## 2014-05-24 DIAGNOSIS — I5033 Acute on chronic diastolic (congestive) heart failure: Secondary | ICD-10-CM

## 2014-05-24 DIAGNOSIS — W19XXXA Unspecified fall, initial encounter: Secondary | ICD-10-CM | POA: Diagnosis present

## 2014-05-24 DIAGNOSIS — E876 Hypokalemia: Secondary | ICD-10-CM

## 2014-05-24 DIAGNOSIS — A419 Sepsis, unspecified organism: Secondary | ICD-10-CM

## 2014-05-24 DIAGNOSIS — M4802 Spinal stenosis, cervical region: Secondary | ICD-10-CM | POA: Diagnosis present

## 2014-05-24 DIAGNOSIS — R000 Tachycardia, unspecified: Secondary | ICD-10-CM

## 2014-05-24 DIAGNOSIS — J189 Pneumonia, unspecified organism: Secondary | ICD-10-CM

## 2014-05-24 DIAGNOSIS — R6510 Systemic inflammatory response syndrome (SIRS) of non-infectious origin without acute organ dysfunction: Secondary | ICD-10-CM | POA: Clinically undetermined

## 2014-05-24 DIAGNOSIS — E46 Unspecified protein-calorie malnutrition: Secondary | ICD-10-CM | POA: Diagnosis present

## 2014-05-24 LAB — CBC
HCT: 37.5 % (ref 36.0–46.0)
HEMOGLOBIN: 12.4 g/dL (ref 12.0–15.0)
MCH: 29.2 pg (ref 26.0–34.0)
MCHC: 33.1 g/dL (ref 30.0–36.0)
MCV: 88.2 fL (ref 78.0–100.0)
Platelets: 223 10*3/uL (ref 150–400)
RBC: 4.25 MIL/uL (ref 3.87–5.11)
RDW: 16 % — AB (ref 11.5–15.5)
WBC: 7.9 10*3/uL (ref 4.0–10.5)

## 2014-05-24 LAB — BASIC METABOLIC PANEL
ANION GAP: 10 (ref 5–15)
BUN: 30 mg/dL — ABNORMAL HIGH (ref 6–20)
CHLORIDE: 113 mmol/L — AB (ref 101–111)
CO2: 20 mmol/L — AB (ref 22–32)
Calcium: 8.4 mg/dL — ABNORMAL LOW (ref 8.9–10.3)
Creatinine, Ser: 1.15 mg/dL — ABNORMAL HIGH (ref 0.44–1.00)
GFR calc Af Amer: 47 mL/min — ABNORMAL LOW (ref >60)
GFR, EST NON AFRICAN AMERICAN: 41 mL/min — AB (ref >60)
Glucose, Bld: 169 mg/dL — ABNORMAL HIGH (ref 70–99)
POTASSIUM: 3.1 mmol/L — AB (ref 3.5–5.1)
Sodium: 143 mmol/L (ref 135–145)

## 2014-05-24 LAB — MAGNESIUM: Magnesium: 2.1 mg/dL (ref 1.7–2.4)

## 2014-05-24 MED ORDER — ALPRAZOLAM 0.5 MG PO TABS: 0.2500 mg | ORAL_TABLET | Freq: Every evening | ORAL | Status: AC | PRN

## 2014-05-24 MED ORDER — LEVOFLOXACIN 500 MG PO TABS: 500.0000 mg | ORAL_TABLET | ORAL | Status: AC

## 2014-05-24 MED ORDER — SIMETHICONE 40 MG/0.6ML PO SUSP: 80.0000 mg | Freq: Four times a day (QID) | ORAL | Status: AC

## 2014-05-24 MED ORDER — ENOXAPARIN SODIUM 30 MG/0.3ML ~~LOC~~ SOLN
30.0000 mg | SUBCUTANEOUS | Status: DC
  Administered 2014-05-24: 30 mg via SUBCUTANEOUS
  Filled 2014-05-24: qty 0.3

## 2014-05-24 MED ORDER — BOOST / RESOURCE BREEZE PO LIQD: 1.0000 | Freq: Three times a day (TID) | ORAL | Status: AC

## 2014-05-24 MED ORDER — POTASSIUM CHLORIDE CRYS ER 20 MEQ PO TBCR
40.0000 meq | EXTENDED_RELEASE_TABLET | ORAL | Status: AC
  Administered 2014-05-24 (×2): 40 meq via ORAL
  Filled 2014-05-24 (×2): qty 2

## 2014-05-24 MED ORDER — POTASSIUM CHLORIDE ER 20 MEQ PO TBCR: 20.0000 meq | EXTENDED_RELEASE_TABLET | Freq: Every day | ORAL | Status: AC

## 2014-05-24 MED ORDER — ENOXAPARIN SODIUM 30 MG/0.3ML ~~LOC~~ SOLN: 30.0000 mg | SUBCUTANEOUS | Status: AC

## 2014-05-24 MED ORDER — HYDROCODONE-ACETAMINOPHEN 5-325 MG PO TABS: 1.0000 | ORAL_TABLET | Freq: Four times a day (QID) | ORAL | Status: AC | PRN

## 2014-05-24 MED ORDER — HYDRALAZINE HCL 10 MG PO TABS: 10.0000 mg | ORAL_TABLET | Freq: Three times a day (TID) | ORAL | Status: AC

## 2014-05-24 MED ORDER — TRAMADOL HCL 50 MG PO TABS: 50.0000 mg | ORAL_TABLET | Freq: Four times a day (QID) | ORAL | Status: DC | PRN

## 2014-05-24 MED ORDER — PANTOPRAZOLE SODIUM 40 MG PO TBEC: 40.0000 mg | DELAYED_RELEASE_TABLET | Freq: Every day | ORAL | Status: AC

## 2014-05-24 NOTE — Clinical Social Work Placement (Signed)
[facility] is set to [facility]. Patient & daughter, Elease Hashimotoatricia aware. Discharge packet given to RN, Catie. [facility] called for transport.     Lincoln MaxinKelly Reanne Nellums, LCSW [facility] Clinical Social Worker cell #: [phone]    CLINICAL SOCIAL WORK PLACEMENT  NOTE  Date:  05/24/2014  Patient Details  Name: Mancel Parsonsrnestine E Pring MRN: [id number] Date of Birth: November 28, 1923  Clinical Social Work is seeking post-discharge placement for this patient at the Skilled  Nursing Facility level of care (*CSW will initial, date and re-position this form in  chart as items are completed):  Yes   Patient/family provided with [facility] Clinical Social Work Department's list of facilities offering this level of care within the geographic area requested by the patient (or if unable, by the patient's family).  Yes   Patient/family informed of their freedom to choose among providers that offer the needed level of care, that participate in Medicare, Medicaid or managed care program needed by the patient, have an available bed and are willing to accept the patient.  Yes   Patient/family informed of [facility] ownership interest in [facility] and [facility], as well as of the fact that they are under no obligation to receive care at these facilities.  PASRR submitted to EDS on       PASRR number received on       Existing PASRR number confirmed on 05/19/14     FL2 transmitted to all facilities in geographic area requested by pt/family on 05/19/14     FL2 transmitted to all facilities within larger geographic area on       Patient informed that his/her managed care company has contracts with or will negotiate with certain facilities, including the following:        Yes   Patient/family informed of bed offers received.  [facility]     Physician recommends and patient chooses bed at      [facility] on 05/24/14.  Patient to be transferred to facility by [facility]     Patient family notified on 05/24/14 of transfer.  Name of family member notified:  patient's daughter, Elease Hashimotoatricia via phone     PHYSICIAN       Additional Comment:    _______________________________________________ Arlyss RepressHarrison, Michell Kader F, LCSW 05/24/2014, 3:39 PM

## 2014-05-24 NOTE — Progress Notes (Signed)
Report called to [facility]. Given to General MillsJacie. All questions answered. Pt transported via [facility].  [name], [name]

## 2014-05-24 NOTE — Progress Notes (Signed)
Physical Therapy Treatment Patient Details Name: Mancel Parsonsrnestine E Leaver MRN: [id number] DOB: 04-24-1923 Today's Date: 05/24/2014    History of Present Illness 81 yo female s/p L femur cephalomedullary nail fixation 05/19/14, L shoulder fx. Hx of R femur fx 03/2014-still [facility]B.     PT Comments    Pt continues to require Max-Tot assist +2 for mobility. Pt participates well (to the best of her ability at this time). Pain was higher on today-rated 8/10 but she appeared to tolerate mobility fairly well. Continue to recommend SNF.   Follow Up Recommendations  SNF;Supervision/Assistance - 24 hour     Equipment Recommendations  None recommended by PT    Recommendations for Other Services       Precautions / Restrictions Precautions Precautions: Fall Required Braces or Orthoses: Sling Restrictions Weight Bearing Restrictions: Yes LUE Weight Bearing: Non weight bearing RLE Weight Bearing: Touchdown weight bearing LLE Weight Bearing: Touchdown weight bearing    Mobility  Bed Mobility Overal bed mobility: Needs Assistance Bed Mobility: Supine to Sit     Supine to sit: Max assist;+2 for physical assistance;+2 for safety/equipment;HOB elevated     General bed mobility comments: Assist for trunk and bil LEs. Utilized bedpad for scooting, positioning. Pt able to move her LEs to EOB on today.   Transfers Overall transfer level: Needs assistance Equipment used: Rolling walker (2 wheeled)            Lateral/Scoot Transfers: +2 physical assistance;Total assist;+2 safety/equipment General transfer comment: Remains total assist +2 for scoot transfers due to limited WBing. Utilized bedpad for scooting from bed to recliner.   Ambulation/Gait                 Stairs            Wheelchair Mobility    Modified Rankin (Stroke Patients Only)       Balance   Sitting-balance support: Feet supported;Single extremity supported Sitting balance-Leahy Scale: Fair                               Cognition Arousal/Alertness: Awake/alert Behavior During Therapy: WFL for tasks assessed/performed Overall Cognitive Status: Within Functional Limits for tasks assessed                      Exercises General Exercises - Lower Extremity Ankle Circles/Pumps: AROM;Both;10 reps;Supine Quad Sets: AROM;Both;10 reps;Supine Heel Slides: AAROM;Both;10 reps;Supine    General Comments        Pertinent Vitals/Pain Pain Assessment: 0-10 Pain Score: 8  Pain Location: L LE Pain Intervention(s): Monitored during session;Repositioned    Home Living                      Prior Function            PT Goals (current goals can now be found in the care plan section) Progress towards PT goals: Progressing toward goals    Frequency  Min 3X/week    PT Plan Current plan remains appropriate    Co-evaluation             End of Session   Activity Tolerance: Patient tolerated treatment well Patient left: in chair;with call bell/phone within reach     Time: 0906-0923 PT Time Calculation (min) (ACUTE ONLY): 17 min  Charges:  $Therapeutic Activity: 8-22 mins  G Codes:      Weston Anna, MPT Pager: [phone]

## 2014-05-24 NOTE — Discharge Summary (Signed)
Physician Discharge Summary  Kimberly Avery ZOX:[id number] DOB: [birth year] DOA: 05/18/2014  PCP: Hollice Espy, MD  Admit date: 05/18/2014 Discharge date: 05/24/2014  Time spent: 80 minutes  Recommendations for Outpatient Follow-up:  1. Follow-up with Dr. Rennis Chris of orthopedics in 1 week for evaluation of left humeral fracture. 2. Follow-up with Dr. Linna Caprice of orthopedics in 2 weeks. 3. Follow-up with M.D. at skilled nursing facility. Patient will need a basic metabolic profile done on Thursday, 05/27/2014 for follow-up on electrolytes and renal function. Patient will also need a CBC done at that time to follow-up on her hemoglobin.  Discharge Diagnoses:  Principal Problem:   Fracture, intertrochanteric, left femur Active Problems:   Protein-calorie malnutrition, severe   CKD (chronic kidney disease), stage III   Acute blood loss anemia   Fracture of left humerus   Fall at home   Essential hypertension   Anemia, chronic disease   Hypokalemia   SIRS (systemic inflammatory response syndrome)   Tachyarrhythmia   Hypertensive urgency   HCAP (healthcare-associated pneumonia)   Renal failure (ARF), acute on chronic   Spinal stenosis in cervical region   Fall   Diastolic CHF, acute on chronic   Protein-calorie malnutrition   Discharge Condition: Stable and improved  Diet recommendation: Heart healthy  Filed Weights   05/22/14 1500 05/23/14 0457 05/24/14 0533  Weight: 56.2 kg (123 lb 14.4 oz) 50.2 kg (110 lb 10.7 oz) 49.5 kg (109 lb 2 oz)    History of present illness:  Per Dr. Lamar Laundry is a 81 y.o. female , widowed, lives at home with 2 daughters, ambulates with the help of a walker/wheelchair, PMH of essential hypertension, HLD, stage III chronic kidney disease, hospitalized 04/17/14-04/23/14 for sepsis secondary to pyelonephritis, severe hyponatremia (sodium 106), acute blood loss anemia, elevated troponin from demand ischemia in the setting of severe  illness, acute encephalopathy and intertrochanteric fracture of right hip for which she underwent medullary nail fixation, now presented with left shoulder and lower extremity pain following mechanical fall at home. On the morning of admission, patient ambulated with the help of a walker to the front door in an attempt to open it for the landscaper's. The next thing she remembers falling on the floor. She clearly denied dizziness, lightheadedness, passing out, chest pain or palpitations. Her daughter witnessed part of the fall and ran to help her. Patient hit her head and left shoulder to the wall prior to falling down. She immediately experienced severe left shoulder and left thigh area pain which were made worse with movement and she was unable to get up. She was assisted up to the wheelchair. In the ED, workup reveals creatinine 1.33, hemoglobin 11.6, chest x-ray without acute disease, CT head and cervical spine without acute findings, left shoulder x-ray with fracture of the left humeral surgical neck and left hip x-ray shows communited intertrochanteric femur fracture of the left. Orthopedic surgeon/Dr. Linna Caprice was consulted by ED PA and plans for surgery. Hospitalist admission was requested.   Hospital Course:  #1 left femur x-ray intertrochanteric fracture secondary to mechanical fall Patient was admitted orthopedics consultation was obtained patient was seen in consultation by Dr. Linna Caprice. Patient subsequently underwent a cephalomedullary nail fixation of left intertrochanteric femur fracture on 05/19/2014 per orthopedics. Patient was seen by PT OT. Was recommended that patient be touchdown weightbearing on bilateral lower extremities. Patient be discharged to a skilled nursing facility. Patient is to follow-up with orthopedics in 2 weeks postdischarge.  #2 left proximal humeral fracture  Secondary to mechanical fall. Patient was seen in consultation by orthopedics. Orthopedics recommended  nonoperative treatment in a sling and nonweightbearing. Outpatient follow-up with orthopedics, Dr. supple.  #3 tachyarrhythmias During the hospitalization patient was noted to have tachycardia H with arrhythmias postoperatively. Cardiology was consulted. Patient had a recent 2-D echo done which showed a normal EF. It was recommended to continue patient on a beta blocker therapy would metoprolol and no further workup was needed. Outpatient follow-up. Patient's heart rate remained stable and patient be discharged in stable condition.  #4 SIRS Patient was noted to meet criteria for SIRS on 05/20/2067 she had a temperature 100.4 heart rate was in the 110s respiratory rate 23-32 with a white count of 11,000. Chest x-ray was done which was suggestive of a lobar pneumonia left lower lobe. Urinalysis which was done was suggestive of a UTI have a urine cultures had no growth. Patient was placed empirically on IV vancomycin and Zosyn and monitored. Patient improved clinically. Patient remained afebrile. Patient was subsequent to transitioned to oral Levaquin be discharged on 3-4 days of oral Levaquin to complete a one-week course of antibiotic therapy.  #5 hypertensive urgency Postoperatively patient was noted to be in hypertensive urgency and was subsequently transferred to the step down unit on 05/19/2014. Patient was placed on scheduled hydralazine as well as continued on her home dose metoprolol. Patient's blood pressure improved. Patient's ACE inhibitor was discontinued secondary to renal insufficiency. Patient blood pressure normalized and was stable by day of discharge.  #6 severe protein calorie malnutrition Patient was seen by nutrition and placed on nutrition supplements.  #7 acute on chronic kidney disease stage III Patient was noted to have a baseline creatinine of 1.1-1.2. Patient's renal function trended up and reach as high as 1.8 to. Felt to be likely secondary to sepsis. ACE inhibitor was held.  Patient's diuretics will also help. Patient was gently hydrated. Patient's renal function improved and was back to baseline by day of discharge.  #8 acute on chronic diastolic CHF Patient had a 2-D echo done in March 2016 that showed a normal EF but a grade 1 diastolic CHF. Patient was noted to have some crackles on examination on 05/21/2014. Patient's diuretics have been on hold. Patient's weight remained stable. IV fluids a subsequently discontinued. Patient's diuretics will be resumed on discharge. Patient was euvolemic by day of discharge.  #9 cervical spinal stenosis noted on CT spine Remained stable. Outpatient follow-up.  #10 falls Felt to be secondary to progressive deconditioning. Patient was seen by PT OT. Patient be discharged to a skilled nursing facility.  #11 acute blood loss anemia/anemia of chronic disease Patient's hemoglobin drop since admission felt to be dilutional in nature as well as postoperatively. Patient's hemoglobin was 6.8 on 05/22/2014. Patient was transfused 2 units of packed red blood cells on 05/22/2014 with appropriate response. She did not have any noted bleeding. Patient's hemoglobin had stabilized.  #12 healthcare associated pneumonia Patient was noted to have a pneumonia per chest x-ray. Patient was placed empirically on IV vancomycin and IV Zosyn. Patient improved clinically. Patient was subsequently transitioned to oral Levaquin be discharged on 3-4 days of oral Levaquin to complete a course of antibiotic therapy. Patient be discharged in stable and improved condition.  #13 underweight BMI was 16.86.  #14 hypokalemia Patient was noted to be hypokalemic in the hospitalization. Patient's potassium was repleted. Patient will need outpatient follow-up.  #15 prophylaxis Patient will be discharged on Lovenox for DVT prophylaxis.  Procedures:  CT  head CT C-spine 05/18/2014   CXR 05/20/14 4/27 - taken to OR, developed post op a-fib with HR 110 - 120's,  HTN-ive urgency with SBP in 180's, requiring transfer to SDU 4/28 - new onset fever T 100.4, RR 23-32 bpm, SIRS/sepsis work up initiated; still in a-fib, Cr trending up 1.31 --> 1.44; cardiology consulted 4/29 - pt clinically improving, Tmax 99.71F, BP mores stable, pt did not get out of the bed yet, transfer to telemetry unit  4/20 - Hg drop down to 6.8, 2 U PRBC requested for transfusion   X-ray of the pelvis 05/19/2014  X-ray of the left shoulder X 05/18/2014  X-ray of the left femur 05/18/2014, 05/19/2014  X-ray of the left hip with pelvis 05/18/2014  cephalomedullary nail fixation of left intertrochanteric femur fracture 05/19/2014 per Dr. Linna Caprice    Consultations:  Cardiology: Dr. Anne Fu 05/20/2014  Orthopedics Dr. Linna Caprice 05/19/2014, 05/20/2014, 05/18/2014 Discharge Exam: Ceasar Mons Vitals:   05/24/14 1320  BP: 136/71  Pulse: 56  Temp: 97.5 F (36.4 C)  Resp: 18    General: NAD Cardiovascular: RRR Respiratory: CTAB anterior lung fields  Discharge Instructions   Discharge Instructions    Diet - low sodium heart healthy    Complete by:  As directed      Discharge instructions    Complete by:  As directed   NWB Left upper extremity, and TDWB Bilateral lower extremites     Increase activity slowly    Complete by:  As directed           Current Discharge Medication List    START taking these medications   Details  feeding supplement, RESOURCE BREEZE, (RESOURCE BREEZE) LIQD Take 1 Container by mouth 3 (three) times daily between meals. Refills: 0    hydrALAZINE (APRESOLINE) 10 MG tablet Take 1 tablet (10 mg total) by mouth every 8 (eight) hours. Qty: 90 tablet, Refills: 0    HYDROcodone-acetaminophen (NORCO/VICODIN) 5-325 MG per tablet Take 1-2 tablets by mouth every 6 (six) hours as needed for moderate pain. Qty: 20 tablet, Refills: 0    levofloxacin (LEVAQUIN) 500 MG tablet Take 1 tablet (500 mg total) by mouth every other day. Take for 4 days then  stop. Qty: 4 tablet, Refills: 0    pantoprazole (PROTONIX) 40 MG tablet Take 1 tablet (40 mg total) by mouth daily. Qty: 30 tablet, Refills: 0    potassium chloride 20 MEQ TBCR Take 20 mEq by mouth daily. Qty: 30 tablet, Refills: 0    simethicone (MYLICON) 40 MG/0.6ML drops Take 1.2 mLs (80 mg total) by mouth 4 (four) times daily. Take for 4 days then take every 6 hours as needed. Qty: 30 mL, Refills: 0      CONTINUE these medications which have CHANGED   Details  ALPRAZolam (XANAX) 0.5 MG tablet Take 0.5-1 tablets (0.25-0.5 mg total) by mouth at bedtime as needed for anxiety. Qty: 15 tablet, Refills: 0    enoxaparin (LOVENOX) 30 MG/0.3ML injection Inject 0.3 mLs (30 mg total) into the skin daily. Qty: 20 Syringe, Refills: 0    traMADol (ULTRAM) 50 MG tablet Take 1 tablet (50 mg total) by mouth every 6 (six) hours as needed for moderate pain or severe pain. Qty: 20 tablet, Refills: 0      CONTINUE these medications which have NOT CHANGED   Details  acetaminophen (TYLENOL) 325 MG tablet Take 2 tablets (650 mg total) by mouth every 4 (four) hours as needed for mild pain (temp > 101.5).  alendronate (FOSAMAX) 70 MG tablet Take 1 tablet by mouth once a week.    aspirin 81 MG tablet Take 81 mg by mouth daily.    docusate sodium (COLACE) 100 MG capsule Take 1 capsule (100 mg total) by mouth 2 (two) times daily. Qty: 10 capsule, Refills: 0    dorzolamide-timolol (COSOPT) 22.3-6.8 MG/ML ophthalmic solution Place 1 drop into both eyes 2 (two) times daily.    metoprolol tartrate (LOPRESSOR) 12.5 mg TABS tablet Take 12.5 mg by mouth 2 (two) times daily.    senna (SENOKOT) 8.6 MG TABS tablet Take 1 tablet (8.6 mg total) by mouth 2 (two) times daily. Qty: 120 each, Refills: 0    simvastatin (ZOCOR) 20 MG tablet Take 1 tablet by mouth daily.    Vitamin D, Ergocalciferol, (DRISDOL) 50000 UNITS CAPS capsule Take 1 capsule by mouth every 14 (fourteen) days.    furosemide (LASIX) 20 MG  tablet Take 1 tablet by mouth daily.      STOP taking these medications     benazepril (LOTENSIN) 20 MG tablet        No Known Allergies Follow-up Information    Follow up with Senaida Lange, MD. Go on 06/07/2014.   Specialty:  Orthopedic Surgery   Why:   appointment time is 10:00 am , left shoulder x-rays   Contact information:   [address] [place] [address] [phone]       Follow up with Swinteck, Cloyde Reams, MD. Go on 06/01/2014.   Specialty:  Orthopedic Surgery   Why:  Appointment time is  3:00pm . For wound re-check left hip   Contact information:   [address] [place] [address] [phone]       Please follow up.   Why:  f/u with MD at SNF       The results of significant diagnostics from this hospitalization (including imaging, microbiology, ancillary and laboratory) are listed below for reference.    Significant Diagnostic Studies: Dg Chest 1 View  05/19/2014   CLINICAL DATA:  Acute onset atrial fibrillation. Postop from hip surgery.  EXAM: CHEST  1 VIEW  COMPARISON:  05/18/2014  FINDINGS: Pulmonary hyperinflation again seen, consistent with COPD. Borderline cardiomegaly remains stable. No evidence of pulmonary infiltrate or edema. No evidence of pneumothorax or pleural effusion. Thoracic spine degenerative changes and dextroscoliosis again noted.  In addition, there is a nondisplaced left humeral neck fracture which shows incomplete healing and is of indeterminate age radiographically. This was not well seen on recent chest radiographs.  IMPRESSION: Stable COPD and borderline cardiomegaly.  No active lung disease.  Acute versus subacute left humeral neck fracture. Recommend dedicated left shoulder radiographs for further evaluation.   Electronically Signed   By: Myles Rosenthal M.D.   On: 05/19/2014 18:40   Dg Chest 1 View  05/18/2014   CLINICAL DATA:  Left shoulder pain after fall.  Initial encounter.  EXAM: CHEST  1  VIEW  COMPARISON:  04/17/2014  FINDINGS: Borderline cardiomegaly, similar to previous when accounting for differences in technique. Aortic and hilar contours are stable.  There is no edema, consolidation, effusion, or pneumothorax.  No appreciable fracture. Scoliotic curvature of the lower thoracic and lumbar spine is stable.  IMPRESSION: No active disease.   Electronically Signed   By: Marnee Spring M.D.   On: 05/18/2014 14:18   Ct Head Wo Contrast  05/18/2014   CLINICAL DATA:  81 year old hypertensive female complaining of left shoulder pain after fall  this morning. Initial encounter.  EXAM: CT HEAD WITHOUT CONTRAST  CT CERVICAL SPINE WITHOUT CONTRAST  TECHNIQUE: Multidetector CT imaging of the head and cervical spine was performed following the standard protocol without intravenous contrast. Multiplanar CT image reconstructions of the cervical spine were also generated.  COMPARISON:  04/17/2014 head CT.  No comparison cervical spine CT.  FINDINGS: On the scout view, left humeral neck fracture is noted.  CT HEAD FINDINGS  No skull fracture or intracranial hemorrhage.  Small vessel disease type changes without CT evidence of large acute infarct.  Global atrophy without hydrocephalus.  No intracranial mass lesion noted on this unenhanced exam.  Vascular calcifications.  Orbital structures unremarkable.  Mastoid air cells, middle ear cavities and visualized paranasal sinuses are clear.  CT CERVICAL SPINE FINDINGS  No cervical spine fracture.  No abnormal prevertebral soft tissue swelling.  Degenerative changes cervical spine with segmental ossification posterior longitudinal ligament C4-5 level. Spinal stenosis and cord flattening most prominent C4-5 and C5-6. If ligamentous injury or cord injury were of high clinical concern, MR imaging could be obtained for further delineation.  T2 superior endplate Schmorl's node deformity/compression fracture of indeterminate age. Acute injury not excluded.  Scarring lung  apices.  IMPRESSION: On the scout view, left humeral neck fracture is noted. Recommend follow-up plain film examination for further delineation.  CT HEAD  No skull fracture or intracranial hemorrhage.  Small vessel disease type changes without CT evidence of large acute infarct.  Global atrophy without hydrocephalus.  CT CERVICAL SPINE  No cervical spine fracture.  Degenerative changes cervical spine with spine stenosis and cord flattening most prominent C4-5 and C5-6 level. If cord injury were of high clinical concern, MR imaging could be obtained for further delineation.  T2 superior endplate Schmorl's node deformity/compression fracture (20% loss of height) of indeterminate age. Acute injury not excluded.   Electronically Signed   By: Lacy Duverney M.D.   On: 05/18/2014 14:17   Ct Cervical Spine Wo Contrast  05/18/2014   CLINICAL DATA:  81 year old hypertensive female complaining of left shoulder pain after fall this morning. Initial encounter.  EXAM: CT HEAD WITHOUT CONTRAST  CT CERVICAL SPINE WITHOUT CONTRAST  TECHNIQUE: Multidetector CT imaging of the head and cervical spine was performed following the standard protocol without intravenous contrast. Multiplanar CT image reconstructions of the cervical spine were also generated.  COMPARISON:  04/17/2014 head CT.  No comparison cervical spine CT.  FINDINGS: On the scout view, left humeral neck fracture is noted.  CT HEAD FINDINGS  No skull fracture or intracranial hemorrhage.  Small vessel disease type changes without CT evidence of large acute infarct.  Global atrophy without hydrocephalus.  No intracranial mass lesion noted on this unenhanced exam.  Vascular calcifications.  Orbital structures unremarkable.  Mastoid air cells, middle ear cavities and visualized paranasal sinuses are clear.  CT CERVICAL SPINE FINDINGS  No cervical spine fracture.  No abnormal prevertebral soft tissue swelling.  Degenerative changes cervical spine with segmental ossification  posterior longitudinal ligament C4-5 level. Spinal stenosis and cord flattening most prominent C4-5 and C5-6. If ligamentous injury or cord injury were of high clinical concern, MR imaging could be obtained for further delineation.  T2 superior endplate Schmorl's node deformity/compression fracture of indeterminate age. Acute injury not excluded.  Scarring lung apices.  IMPRESSION: On the scout view, left humeral neck fracture is noted. Recommend follow-up plain film examination for further delineation.  CT HEAD  No skull fracture or intracranial hemorrhage.  Small vessel  disease type changes without CT evidence of large acute infarct.  Global atrophy without hydrocephalus.  CT CERVICAL SPINE  No cervical spine fracture.  Degenerative changes cervical spine with spine stenosis and cord flattening most prominent C4-5 and C5-6 level. If cord injury were of high clinical concern, MR imaging could be obtained for further delineation.  T2 superior endplate Schmorl's node deformity/compression fracture (20% loss of height) of indeterminate age. Acute injury not excluded.   Electronically Signed   By: Lacy Duverney M.D.   On: 05/18/2014 14:17   Pelvis Portable  05/19/2014   CLINICAL DATA:  Status post fixation of a left intertrochanteric fracture.  EXAM: PORTABLE PELVIS 1-2 VIEWS  COMPARISON:  Single view of the pelvis 05/18/2014.  FINDINGS: There is partial visualization of a new intramedullary nail and hip screw for fixation of a left intertrochanteric fracture. Visualized hardware is intact. Position and alignment of the patient's fracture is markedly improved. Healing right subtrochanteric fracture with fixation hardware is noted. No new abnormality is identified. Foley catheter is in place.  IMPRESSION: Status post fixation of a left intertrochanteric fracture without evidence of complication.  Healing right subtrochanteric fracture with fixation hardware in place.   Electronically Signed   By: Drusilla Kanner  M.D.   On: 05/19/2014 15:54   Dg Chest Port 1 View  05/20/2014   CLINICAL DATA:  Shortness of breath.  Anxiety.  Symptoms today.  EXAM: PORTABLE CHEST - 1 VIEW  COMPARISON:  Single view of the chest 05/19/2014 and 05/18/2014. PA and lateral chest 03/2018 06/1014.  FINDINGS: The chest is hyperexpanded. The patient has small bilateral pleural effusions, greater on the left. Mild airspace disease is seen the left lung base heart size is normal. No pneumothorax identified. Surgical neck fracture left humerus is identified as on the prior examination.  IMPRESSION: Small bilateral pleural effusions, greater on the left. Mild appearing left basilar airspace disease could be due to atelectasis or infection.   Electronically Signed   By: Drusilla Kanner M.D.   On: 05/20/2014 10:23   Dg Shoulder Left  05/18/2014   CLINICAL DATA:  81 year old female post fall with left shoulder pain. Initial encounter.  EXAM: LEFT SHOULDER - 2+ VIEW  COMPARISON:  None.  FINDINGS: Fracture of the left humeral surgical neck with slight rotation of the proximal humeral head/surgical neck with respect to the adjacent humeral shaft.  Calcified tortuous aorta.  IMPRESSION: Fracture of the left humeral surgical neck with slight rotation of the proximal humeral head/surgical neck with respect to the adjacent humeral shaft.   Electronically Signed   By: Lacy Duverney M.D.   On: 05/18/2014 14:20   Dg C-arm 1-60 Min-no Report  05/19/2014   CLINICAL DATA:  ORIF of a intertrochanteric proximal femur fracture.  EXAM: DG C-ARM 1-60 MIN - NRPT [facility]; LEFT FEMUR 2 VIEWS  COMPARISON:  05/18/2014.  FLUOROSCOPY TIME:  Radiation Exposure Index (as provided by the fluoroscopic device):  If the device does not provide the exposure index:  Fluoroscopy Time:  2 minutes and 45 seconds  Number of Acquired Images:  4  FINDINGS: Can intra medullary rod has been placed spanning the intertrochanteric fracture, supporting a compression screw. The major fracture  fragments are in near-anatomic alignment. There is no new fracture or evidence of an operative complication.  IMPRESSION: Well-aligned fracture fragments following ORIF.   Electronically Signed   By: Amie Portland M.D.   On: 05/19/2014 14:46   Dg Hip Unilat With Pelvis 2-3 Views  Left  05/18/2014   CLINICAL DATA:  Pain following fall  EXAM: LEFT HIP (WITH PELVIS) 2-3 VIEWS  COMPARISON:  April 17, 2014  FINDINGS: Frontal pelvis as well as frontal and lateral left hip images were obtained. There is a comminuted fracture of the left intertrochanteric region with varus angulation at the fracture site. There is avulsion of the lesser trochanter medially. On the right, there is postoperative change with screw and rod fixation through a recent intertrochanteric and subtrochanteric femur fracture with major fracture fragments in near anatomic alignment. No dislocations appreciable. There is moderate narrowing of both hip joints. There are small calcified uterine leiomyomas within the pelvis. There is degenerative change in the lower lumbar spine.  IMPRESSION: Comminuted intertrochanteric femur fracture on the left with varus angulation at the fracture site and avulsion of the lesser trochanter. Postoperative change on the right. Moderate narrowing both hip joints. No dislocation.   Electronically Signed   By: Bretta Bang III M.D.   On: 05/18/2014 14:18   Dg Femur Min 2 Views Left  05/19/2014   CLINICAL DATA:  Left intramedullary rod placement. Left hip fracture.  EXAM: LEFT FEMUR 2 VIEWS  COMPARISON:  Multiple exams, including 05/19/2014 and 05/18/2014  FINDINGS: Left hip IM nail noted. Varus angulation at the intertrochanteric fracture site has resolved. Separate lesser trochanteric fragments noted. I do not observe any new fracture.  IMPRESSION: 1. Left hip IM nail noted with restoration abnormal angulation. 2. Lesser trochanteric fragments incidentally noted.   Electronically Signed   By: Gaylyn Rong  M.D.   On: 05/19/2014 15:57   Dg Femur Min 2 Views Left  05/19/2014   CLINICAL DATA:  ORIF of a intertrochanteric proximal femur fracture.  EXAM: DG C-ARM 1-60 MIN - [facility]PT [facility]; LEFT FEMUR 2 VIEWS  COMPARISON:  05/18/2014.  FLUOROSCOPY TIME:  Radiation Exposure Index (as provided by the fluoroscopic device):  If the device does not provide the exposure index:  Fluoroscopy Time:  2 minutes and 45 seconds  Number of Acquired Images:  4  FINDINGS: Can intra medullary rod has been placed spanning the intertrochanteric fracture, supporting a compression screw. The major fracture fragments are in near-anatomic alignment. There is no new fracture or evidence of an operative complication.  IMPRESSION: Well-aligned fracture fragments following ORIF.   Electronically Signed   By: Amie Portland M.D.   On: 05/19/2014 14:46   Dg Femur Min 2 Views Left  05/18/2014   CLINICAL DATA:  Left intertrochanteric fracture  EXAM: LEFT FEMUR 2 VIEWS  COMPARISON:  Pelvis and left hip images obtained earlier in the day  FINDINGS: Frontal and lateral views obtained. There is a comminuted intertrochanteric femur fracture on the left with varus angulation of the fracture site and medial avulsion of the lesser trochanter. More distally, no fracture. No dislocation. There is mild narrowing of the left hip joint.  IMPRESSION: Comminuted intertrochanteric femur fracture proximally. No fracture more distally. No dislocation. Mild narrowing left hip joint.   Electronically Signed   By: Bretta Bang III M.D.   On: 05/18/2014 17:28    Microbiology: Recent Results (from the past 240 hour(s))  Surgical pcr screen     Status: None   Collection Time: 05/18/14 10:05 PM  Result Value Ref Range Status   MRSA, PCR NEGATIVE NEGATIVE Final   Staphylococcus aureus NEGATIVE NEGATIVE Final    Comment:        The Xpert SA Assay (FDA approved for NASAL specimens in patients over 21  years of age), is one component of a comprehensive  surveillance program.  Test performance has been validated by [facility] for patients greater than or equal to 56 year old. It is not intended to diagnose infection nor to guide or monitor treatment.   Culture, blood (x 2)     Status: None (Preliminary result)   Collection Time: 05/20/14 10:00 AM  Result Value Ref Range Status   Specimen Description BLOOD RIGHT ARM  Final   Special Requests BOTTLES DRAWN AEROBIC ONLY 2CC  Final   Culture   Final           BLOOD CULTURE RECEIVED NO GROWTH TO DATE CULTURE WILL BE HELD FOR 5 DAYS BEFORE ISSUING A FINAL NEGATIVE REPORT Note: Culture results may be compromised due to an inadequate volume of blood received in culture bottles. Performed at [facility]    Report Status PENDING  Incomplete  Culture, blood (x 2)     Status: None (Preliminary result)   Collection Time: 05/20/14 10:02 AM  Result Value Ref Range Status   Specimen Description BLOOD RIGHT HAND  Final   Special Requests BOTTLES DRAWN AEROBIC AND ANAEROBIC 5CC  Final   Culture   Final           BLOOD CULTURE RECEIVED NO GROWTH TO DATE CULTURE WILL BE HELD FOR 5 DAYS BEFORE ISSUING A FINAL NEGATIVE REPORT Performed at [facility]    Report Status PENDING  Incomplete  Urine culture     Status: None   Collection Time: 05/20/14 11:32 AM  Result Value Ref Range Status   Specimen Description URINE, CATHETERIZED  Final   Special Requests NONE  Final   Colony Count NO GROWTH Performed at [facility]   Final   Culture NO GROWTH Performed at [facility]   Final   Report Status 05/22/2014 FINAL  Final     Labs: Basic Metabolic Panel:  Recent Labs Lab 05/20/14 0325 05/21/14 0348 05/22/14 0418 05/23/14 0536 05/24/14 0610 05/24/14 0624  NA 141 142 144 140 143  --   K 3.9 3.7 3.5 3.9 3.1*  --   CL 111 111 112 114* 113*  --   CO2 23 24 23  21* 20*  --   GLUCOSE 131* 127* 107* 115* 169*  --   BUN 22 31* 34* 30* 30*  --   CREATININE 1.44*  1.81* 1.63* 1.40* 1.15*  --   CALCIUM 7.7* 7.7* 7.6* 7.7* 8.4*  --   MG  --   --   --   --   --  2.1   Liver Function Tests: No results for input(s): AST, ALT, ALKPHOS, BILITOT, PROT, ALBUMIN in the last 168 hours. No results for input(s): LIPASE, AMYLASE in the last 168 hours. No results for input(s): AMMONIA in the last 168 hours. CBC:  Recent Labs Lab 05/18/14 1235  05/20/14 0325 05/21/14 0348 05/22/14 0418 05/23/14 0536 05/24/14 0610  WBC 7.5  < > 11.4* 10.8* 8.6 8.1 7.9  NEUTROABS 6.2  --   --   --   --   --   --   HGB 11.6*  < > 8.5* 7.5* 6.8* 10.8* 12.4  HCT 36.3  < > 25.9* 22.3* 21.3* 31.8* 37.5  MCV 94.0  < > 92.8 91.0 91.4 86.6 88.2  PLT 234  < > 176 159 151 179 223  < > = values in this interval not displayed. Cardiac Enzymes: No results for input(s): CKTOTAL,  CKMB, CKMBINDEX, TROPONINI in the last 168 hours. BNP: BNP (last 3 results) No results for input(s): BNP in the last 8760 hours.  ProBNP (last 3 results) No results for input(s): PROBNP in the last 8760 hours.  CBG: No results for input(s): GLUCAP in the last 168 hours.     SignedRamiro Harvest MD [facility] 05/24/2014, 3:19 PM

## 2014-05-24 NOTE — Care Management Note (Signed)
Case Management Note  Patient Details  Name: Kimberly Avery MRN: [id number] Date of Birth: 02/02/1923  Subjective/Objective:       Fall. L femur fx.             Action/Plan:From From home.   Expected Discharge Date:   (unknown)05/24/14               Expected Discharge Plan:  Skilled Nursing Facility  In-House Referral:     Discharge planning Services     Post Acute Care Choice:    Choice offered to:     DME Arranged:    DME Agency:     HH Arranged:    HH Agency:     Status of Service:  Completed, signed off  Medicare Important Message Given:    Date Medicare IM Given:    Medicare IM give by:    Date Additional Medicare IM Given:    Additional Medicare Important Message give by:     If discussed at Long Length of Stay Meetings, dates discussed:    Additional Comments:d/c snf.  Lanier ClamMahabir, Alexes Menchaca, RN 05/24/2014, 3:32 PM

## 2014-05-24 NOTE — Progress Notes (Signed)
   Subjective:  Patient reports pain as mild.   Objective:   VITALS:   Filed Vitals:   05/23/14 2052 05/24/14 0000 05/24/14 0400 05/24/14 0533  BP: 141/80   150/65  Pulse: 69   76  Temp: 98.6 F (37 C)   98.5 F (36.9 C)  TempSrc: Oral   Oral  Resp: 16 18 18 17   Height:      Weight:    49.5 kg (109 lb 2 oz)  SpO2: 99% 96% 97% 100%   NAD LUE in sling. + AIN/PIN/U. 2+ radial. SILT. ABD soft Sensation intact distally Intact pulses distally Dorsiflexion/Plantar flexion intact Incision: dressing C/D/I    Lab Results  Component Value Date   WBC 7.9 05/24/2014   HGB 12.4 05/24/2014   HCT 37.5 05/24/2014   MCV 88.2 05/24/2014   PLT 223 05/24/2014   BMET    Component Value Date/Time   NA 143 05/24/2014 0610   NA 140 05/03/2014   K 3.1* 05/24/2014 0610   CL 113* 05/24/2014 0610   CO2 20* 05/24/2014 0610   GLUCOSE 169* 05/24/2014 0610   BUN 30* 05/24/2014 0610   BUN 24* 05/03/2014   CREATININE 1.15* 05/24/2014 0610   CREATININE 1.2* 05/03/2014   CALCIUM 8.4* 05/24/2014 0610   GFRNONAA 41* 05/24/2014 0610   GFRAA 47* 05/24/2014 0610     Assessment/Plan: 5 Days Post-Op   Principal Problem:   Fracture, intertrochanteric, left femur Active Problems:   Protein-calorie malnutrition, severe   CKD (chronic kidney disease), stage III   Fracture of left humerus   Fall at home   Essential hypertension   Anemia, chronic disease   NWB LUE, TDWB BLE PT/OT for bed to chair xfers (x6 weeks) DVT ppx: Lovenox, SCDs, TEDs PO pain control Will need SNF   Alpha Mysliwiec, Cloyde ReamsBrian James 05/24/2014, 7:28 AM   Samson FredericBrian Jenina Moening, MD Cell [phone]

## 2014-05-26 LAB — TYPE AND SCREEN
ABO/RH(D): O POS
Antibody Screen: NEGATIVE
Unit division: 0
Unit division: 0
Unit division: 0

## 2014-05-26 LAB — CULTURE, BLOOD (ROUTINE X 2)
CULTURE: NO GROWTH
Culture: NO GROWTH

## 2014-06-17 ENCOUNTER — Other Ambulatory Visit: Payer: Self-pay | Admitting: Internal Medicine

## 2014-07-14 ENCOUNTER — Other Ambulatory Visit: Payer: Self-pay | Admitting: Internal Medicine

## 2014-08-18 ENCOUNTER — Other Ambulatory Visit: Payer: Self-pay | Admitting: Family Medicine

## 2014-08-18 ENCOUNTER — Ambulatory Visit
Admission: RE | Admit: 2014-08-18 | Discharge: 2014-08-18 | Disposition: A | Discharge status: Home / Self Care | Payer: Medicare Other | Source: Ambulatory Visit | Attending: Family Medicine | Admitting: Family Medicine

## 2014-08-18 DIAGNOSIS — M545 Low back pain: Secondary | ICD-10-CM

## 2014-09-13 ENCOUNTER — Ambulatory Visit
Admission: RE | Admit: 2014-09-13 | Discharge: 2014-09-13 | Disposition: A | Discharge status: Home / Self Care | Payer: Federal, State, Local not specified - PPO | Source: Ambulatory Visit | Attending: Family Medicine | Admitting: Family Medicine

## 2014-09-13 ENCOUNTER — Other Ambulatory Visit: Payer: Self-pay | Admitting: Family Medicine

## 2014-09-13 DIAGNOSIS — M4856XA Collapsed vertebra, not elsewhere classified, lumbar region, initial encounter for fracture: Secondary | ICD-10-CM

## 2014-09-16 ENCOUNTER — Other Ambulatory Visit (HOSPITAL_COMMUNITY): Payer: Self-pay | Admitting: Interventional Radiology

## 2014-09-16 DIAGNOSIS — IMO0002 Reserved for concepts with insufficient information to code with codable children: Secondary | ICD-10-CM

## 2014-09-18 ENCOUNTER — Emergency Department (HOSPITAL_COMMUNITY): Payer: Federal, State, Local not specified - PPO

## 2014-09-18 ENCOUNTER — Encounter (HOSPITAL_COMMUNITY): Payer: Self-pay | Admitting: Emergency Medicine

## 2014-09-18 ENCOUNTER — Emergency Department (HOSPITAL_COMMUNITY)
Admission: EM | Admit: 2014-09-18 | Discharge: 2014-09-18 | Disposition: A | Discharge status: Home / Self Care | Payer: Federal, State, Local not specified - PPO | Attending: Emergency Medicine | Admitting: Emergency Medicine

## 2014-09-18 DIAGNOSIS — S0081XA Abrasion of other part of head, initial encounter: Secondary | ICD-10-CM | POA: Diagnosis not present

## 2014-09-18 DIAGNOSIS — W01198A Fall on same level from slipping, tripping and stumbling with subsequent striking against other object, initial encounter: Secondary | ICD-10-CM | POA: Diagnosis not present

## 2014-09-18 DIAGNOSIS — E785 Hyperlipidemia, unspecified: Secondary | ICD-10-CM | POA: Diagnosis not present

## 2014-09-18 DIAGNOSIS — S0993XA Unspecified injury of face, initial encounter: Secondary | ICD-10-CM | POA: Diagnosis present

## 2014-09-18 DIAGNOSIS — Z7982 Long term (current) use of aspirin: Secondary | ICD-10-CM | POA: Insufficient documentation

## 2014-09-18 DIAGNOSIS — I129 Hypertensive chronic kidney disease with stage 1 through stage 4 chronic kidney disease, or unspecified chronic kidney disease: Secondary | ICD-10-CM | POA: Diagnosis not present

## 2014-09-18 DIAGNOSIS — Y998 Other external cause status: Secondary | ICD-10-CM | POA: Insufficient documentation

## 2014-09-18 DIAGNOSIS — Y9389 Activity, other specified: Secondary | ICD-10-CM | POA: Insufficient documentation

## 2014-09-18 DIAGNOSIS — T148XXA Other injury of unspecified body region, initial encounter: Secondary | ICD-10-CM

## 2014-09-18 DIAGNOSIS — Z79899 Other long term (current) drug therapy: Secondary | ICD-10-CM | POA: Diagnosis not present

## 2014-09-18 DIAGNOSIS — M810 Age-related osteoporosis without current pathological fracture: Secondary | ICD-10-CM | POA: Insufficient documentation

## 2014-09-18 DIAGNOSIS — E559 Vitamin D deficiency, unspecified: Secondary | ICD-10-CM | POA: Diagnosis not present

## 2014-09-18 DIAGNOSIS — W19XXXA Unspecified fall, initial encounter: Secondary | ICD-10-CM

## 2014-09-18 DIAGNOSIS — H409 Unspecified glaucoma: Secondary | ICD-10-CM | POA: Insufficient documentation

## 2014-09-18 DIAGNOSIS — Y92000 Kitchen of unspecified non-institutional (private) residence as  the place of occurrence of the external cause: Secondary | ICD-10-CM | POA: Insufficient documentation

## 2014-09-18 DIAGNOSIS — N184 Chronic kidney disease, stage 4 (severe): Secondary | ICD-10-CM | POA: Insufficient documentation

## 2014-09-18 DIAGNOSIS — S6991XA Unspecified injury of right wrist, hand and finger(s), initial encounter: Secondary | ICD-10-CM | POA: Insufficient documentation

## 2014-09-18 DIAGNOSIS — M109 Gout, unspecified: Secondary | ICD-10-CM | POA: Diagnosis not present

## 2014-09-18 MED ORDER — TETANUS-DIPHTH-ACELL PERTUSSIS 5-2.5-18.5 LF-MCG/0.5 IM SUSP
0.5000 mL | Freq: Once | INTRAMUSCULAR | Status: AC
  Administered 2014-09-18: 0.5 mL via INTRAMUSCULAR
  Filled 2014-09-18: qty 0.5

## 2014-09-18 NOTE — ED Notes (Signed)
pt states that she was on a step stool and coming down and her walker that she grabbed a hold of turned over and pt fell landing her left side. Pt hit her head on floor pt denies LOC.  Pt states that she has bruising to her left arm. Pt has cut on ring finger on right side that is bleeding.  Pt has abrasion to left forehead.

## 2014-09-18 NOTE — ED Notes (Signed)
MD at bedside. 

## 2014-09-18 NOTE — Discharge Instructions (Signed)
Your imaging studies did not show serious injury today. Return for worsening symptoms, including confusion, headaches, difficulty walking, numbness/weakness, fever, redness/swelling/pus drainage from your wounds, or any other symptoms concerning to you.   Abrasions An abrasion is a cut or scrape of the skin. Abrasions do not go through all layers of the skin. HOME CARE  If a bandage (dressing) was put on your wound, change it as told by your doctor. If the bandage sticks, soak it off with warm.  Wash the area with water and soap 2 times a day. Rinse off the soap. Pat the area dry with a clean towel.  Put on medicated cream (ointment) as told by your doctor.  Change your bandage right away if it gets wet or dirty.  Only take medicine as told by your doctor.  See your doctor within 24-48 hours to get your wound checked.  Check your wound for redness, puffiness (swelling), or yellowish-white fluid (pus). GET HELP RIGHT AWAY IF:   You have more pain in the wound.  You have redness, swelling, or tenderness around the wound.  You have pus coming from the wound.  You have a fever or lasting symptoms for more than 2-3 days.  You have a fever and your symptoms suddenly get worse.  You have a bad smell coming from the wound or bandage. MAKE SURE YOU:   Understand these instructions.  Will watch your condition.  Will get help right away if you are not doing well or get worse. Document Released: 06/27/2007 Document Revised: 10/03/2011 Document Reviewed: 12/12/2010 [facility] Patient Information 2015 [place], [place]. This information is not intended to replace advice given to you by your health care provider. Make sure you discuss any questions you have with your health care provider.

## 2014-09-18 NOTE — ED Provider Notes (Signed)
CSN: [id number]     Arrival date & time 09/18/14  1431 History   First MD Initiated Contact with Patient 09/18/14 1549     Chief Complaint  Patient presents with  . Fall    head  . head abrasion   . Finger Injury     (Consider location/radiation/quality/duration/timing/severity/associated sxs/prior Treatment) HPI 81 year old female who presents after fall. History of HTN, HLD, and CKD. Does not take anticoagulation medications. Reports being in usual state of health. Was in her kitchen on a step stool trying to reach upper cabinet. While trying to come down, she missed her walker and fell on the ground onto her left side. She did hit her head, but no LOC, HA, N/V. She was able to get up on her own from her fall and was able to ambulate with her walker per usual. Had bleeding from abrasion to left eyebrow and bleeding to the right 4th digit. Denies chest pain, neck pain, back pain, hip pain, sob, abdominal pain or other injuries. Unknown last tetanus.   Past Medical History  Diagnosis Date  . Hypertension   . Hyperlipidemia   . Gout   . Osteoporosis   . CKD (chronic kidney disease) stage 4, GFR 15-29 ml/min   . Glaucoma   . Vitamin D deficiency    Past Surgical History  Procedure Laterality Date  . Breast surgery Bilateral 1970    mastectomies - fiboradenoma  . Eye surgery  2003, 2004  . Temporal artery biopsy / ligation  2001  . Femur im nail Right 04/21/2014    Procedure: right proximal femur ORIF ;  Surgeon: Samson Frederic, MD;  Location: [facility];  Service: Orthopedics;  Laterality: Right;  . Femur im nail Left 05/19/2014    Procedure: IM NAIL LEFT HIP;  Surgeon: Samson Frederic, MD;  Location: [facility];  Service: Orthopedics;  Laterality: Left;   Family History  Problem Relation Age of Onset  . Heart attack Mother     MI while in hospital, RA, HTN  . Stroke Father     MI, CAD, alcoholism  . Cancer Brother     prostate cancer  . Heart Problems Sister     pacemaker, DM, RA,  HTN   Social History  Substance Use Topics  . Smoking status: Never Smoker   . Smokeless tobacco: Never Used  . Alcohol Use: No   OB History    No data available     Review of Systems 10/14 systems reviewed and are negative other than those stated in the HPI  Allergies  Review of patient's allergies indicates no known allergies.  Home Medications   Prior to Admission medications   Medication Sig Start Date End Date Taking? Authorizing Provider  ALPRAZolam Prudy Feeler) 0.5 MG tablet Take 0.5-1 tablets (0.25-0.5 mg total) by mouth at bedtime as needed for anxiety. 05/24/14  Yes Rodolph Bong, MD  aspirin 81 MG tablet Take 81 mg by mouth daily.   Yes Historical Provider, MD  dorzolamide-timolol (COSOPT) 22.3-6.8 MG/ML ophthalmic solution Place 1 drop into both eyes 2 (two) times daily. 09/19/13  Yes Historical Provider, MD  furosemide (LASIX) 20 MG tablet Take 1 tablet by mouth daily. 05/18/14  Yes Historical Provider, MD  hydrALAZINE (APRESOLINE) 10 MG tablet Take 1 tablet (10 mg total) by mouth every 8 (eight) hours. Patient taking differently: Take 10 mg by mouth 2 (two) times daily.  05/24/14  Yes Rodolph Bong, MD  metoprolol tartrate (LOPRESSOR) 12.5 mg  TABS tablet Take 12.5 mg by mouth 2 (two) times daily.   Yes Historical Provider, MD  potassium chloride 20 MEQ TBCR Take 20 mEq by mouth daily. 05/24/14  Yes Rodolph Bong, MD  Simethicone (GAS-X PO) Take 1 tablet by mouth 2 (two) times daily.   Yes Historical Provider, MD  simvastatin (ZOCOR) 20 MG tablet Take 1 tablet by mouth daily. 08/27/13  Yes Historical Provider, MD  traMADol (ULTRAM) 50 MG tablet Take 1 tablet (50 mg total) by mouth every 6 (six) hours as needed for moderate pain or severe pain. 05/24/14  Yes Rodolph Bong, MD  acetaminophen (TYLENOL) 325 MG tablet Take 2 tablets (650 mg total) by mouth every 4 (four) hours as needed for mild pain (temp > 101.5). Patient not taking: Reported on 09/18/2014 04/24/14   Maryruth Bun Rama, MD  docusate sodium (COLACE) 100 MG capsule Take 1 capsule (100 mg total) by mouth 2 (two) times daily. Patient not taking: Reported on 09/18/2014 04/24/14   Maryruth Bun Rama, MD  enoxaparin (LOVENOX) 30 MG/0.3ML injection Inject 0.3 mLs (30 mg total) into the skin daily. Patient not taking: Reported on 09/18/2014 05/24/14   Rodolph Bong, MD  feeding supplement, RESOURCE BREEZE, (RESOURCE BREEZE) LIQD Take 1 Container by mouth 3 (three) times daily between meals. Patient not taking: Reported on 09/18/2014 05/24/14   Rodolph Bong, MD  HYDROcodone-acetaminophen (NORCO/VICODIN) 5-325 MG per tablet Take 1-2 tablets by mouth every 6 (six) hours as needed for moderate pain. Patient not taking: Reported on 09/18/2014 05/24/14   Rodolph Bong, MD  levofloxacin (LEVAQUIN) 500 MG tablet Take 1 tablet (500 mg total) by mouth every other day. Take for 4 days then stop. Patient not taking: Reported on 09/18/2014 05/24/14   Rodolph Bong, MD  pantoprazole (PROTONIX) 40 MG tablet Take 1 tablet (40 mg total) by mouth daily. Patient not taking: Reported on 09/18/2014 05/24/14   Rodolph Bong, MD  senna (SENOKOT) 8.6 MG TABS tablet Take 1 tablet (8.6 mg total) by mouth 2 (two) times daily. Patient not taking: Reported on 09/18/2014 04/24/14   Maryruth Bun Rama, MD  simethicone (MYLICON) 40 MG/0.6ML drops Take 1.2 mLs (80 mg total) by mouth 4 (four) times daily. Take for 4 days then take every 6 hours as needed. Patient not taking: Reported on 09/18/2014 05/24/14   Rodolph Bong, MD  Vitamin D, Ergocalciferol, (DRISDOL) 50000 UNITS CAPS capsule Take 1 capsule by mouth every 14 (fourteen) days. Fridays 09/17/13   Historical Provider, MD   BP 179/62 mmHg  Pulse 63  Temp(Src) 98 F (36.7 C) (Oral)  Resp 18  SpO2 98% Physical Exam Physical Exam  Nursing note and vitals reviewed. Constitutional: Well developed, well nourished, non-toxic, and in no acute distress Head: Normocephalic and atraumatic.   Mouth/Throat: Oropharynx is clear and moist.  Neck: Normal range of motion. Neck supple. No cervical spine tenderness. Cardiovascular: Normal rate and regular rhythm.  No edema.  Pulmonary/Chest: Effort normal and breath sounds normal. No chest wall tenderness Abdominal: Soft. There is no tenderness. There is no rebound and no guarding.  Musculoskeletal: Normal range of motion. no TLS spine tenderness. Neurological: Alert, no facial droop, fluent speech, moves all extremities symmetrically Skin: Skin is warm and dry. Abrasion over left eye brow. Superficial abrasion over the left elbow and left forearm. Abrasion lateral to the nail of the 4th digit of the right hand.  Psychiatric: Cooperative  ED Course  Procedures (including critical  care time) Labs Review Labs Reviewed - No data to display  Imaging Review Dg Wrist Complete Left  09/18/2014   CLINICAL DATA:  Pt states that she was going to step on a step stool. She grabbed her walker but it turned over and pt fell, landing her left side. Pt hit her head on floor. Pt states that she has bruising noted to her posterior wrist. States pain when clenching fist. H/o osteoporosis  EXAM: LEFT WRIST - COMPLETE 3+ VIEW  COMPARISON:  None.  FINDINGS: No acute fracture or dislocation. Mild irregularity of the distal radial metaphysis with a focal area bulging of the posterior cortex is likely from an old, healed fracture.  There is joint space narrowing, subchondral sclerosis and marginal osteophytes at the first carpal metacarpal articulation. No significant arthropathic change at the remaining joints.  Bones are diffusely demineralized.  Soft tissues are unremarkable.  IMPRESSION: No acute fracture or dislocation.   Electronically Signed   By: Amie Portland M.D.   On: 09/18/2014 15:32   Ct Head Wo Contrast  09/18/2014   CLINICAL DATA:  Fall, hit left side of head  EXAM: CT HEAD WITHOUT CONTRAST  CT CERVICAL SPINE WITHOUT CONTRAST  TECHNIQUE:  Multidetector CT imaging of the head and cervical spine was performed following the standard protocol without intravenous contrast. Multiplanar CT image reconstructions of the cervical spine were also generated.  COMPARISON:  05/18/2014  FINDINGS: CT HEAD FINDINGS  There is atrophy and chronic small vessel disease changes. No acute intracranial abnormality. Specifically, no hemorrhage, hydrocephalus, mass lesion, acute infarction, or significant intracranial injury. No acute calvarial abnormality. Paranasal sinuses are clear  CT CERVICAL SPINE FINDINGS  Moderate to severe degenerative disc and facet disease diffusely throughout the cervical spine. Loss of normal cervical lordosis. Prevertebral soft tissues are normal. No fracture. No epidural or paraspinal hematoma.  IMPRESSION: No acute intracranial abnormality.  Atrophy, chronic microvascular disease.  Diffuse cervical spondylosis and loss of normal cervical lordosis. No acute bony abnormality.   Electronically Signed   By: Charlett Nose M.D.   On: 09/18/2014 16:33   Ct Cervical Spine Wo Contrast  09/18/2014   CLINICAL DATA:  Fall, hit left side of head  EXAM: CT HEAD WITHOUT CONTRAST  CT CERVICAL SPINE WITHOUT CONTRAST  TECHNIQUE: Multidetector CT imaging of the head and cervical spine was performed following the standard protocol without intravenous contrast. Multiplanar CT image reconstructions of the cervical spine were also generated.  COMPARISON:  05/18/2014  FINDINGS: CT HEAD FINDINGS  There is atrophy and chronic small vessel disease changes. No acute intracranial abnormality. Specifically, no hemorrhage, hydrocephalus, mass lesion, acute infarction, or significant intracranial injury. No acute calvarial abnormality. Paranasal sinuses are clear  CT CERVICAL SPINE FINDINGS  Moderate to severe degenerative disc and facet disease diffusely throughout the cervical spine. Loss of normal cervical lordosis. Prevertebral soft tissues are normal. No fracture. No  epidural or paraspinal hematoma.  IMPRESSION: No acute intracranial abnormality.  Atrophy, chronic microvascular disease.  Diffuse cervical spondylosis and loss of normal cervical lordosis. No acute bony abnormality.   Electronically Signed   By: Charlett Nose M.D.   On: 09/18/2014 16:33   Dg Hand Complete Right  09/18/2014   CLINICAL DATA:  Injury to right ring finger from fall.  EXAM: RIGHT HAND - COMPLETE 3+ VIEW  COMPARISON:  None.  FINDINGS: Exam demonstrates diffuse decreased bone mineralization. There are mild degenerative changes over the radiocarpal joint, carpal bones and interphalangeal joints. Moderate degenerative change  over the first carpometacarpal joint. Suggestion of an old ulnar styloid fracture. Bony remodeling over the distal radial metaphysis likely an old fracture site. No definite evidence of acute fracture or dislocation.  IMPRESSION: No acute findings.  Degenerative changes as described.   Electronically Signed   By: Elberta Fortis M.D.   On: 09/18/2014 16:34   I have personally reviewed and evaluated these images and lab results as part of my medical decision-making.   MDM   Final diagnoses:  Fall, initial encounter  Abrasion of skin  Abrasion of face, initial encounter    81 year old female with history of hypertension, hyperlipidemia, and no history of anticoagulation who presents after mechanical fall she is well-appearing and in no acute distress. Vital signs are non-concerning. She on exam has superficial abrasion noted to the left eyebrow, multiple superficial abrasions noted to the left elbow and left forearm. There is also abrasion noted over the fourth digit of the right hand. No other acute injuries are noted. Given her age we will obtain CT head and cervical spine and x-rays of her right hand. Imaging is unremarkable.  She'll is discharged home with supportive care and follow-up. Tetanus is updated in the emergency department.Lavera Guise, MD 09/18/14  [phone]

## 2014-10-05 ENCOUNTER — Ambulatory Visit (HOSPITAL_COMMUNITY): Admission: RE | Admit: 2014-10-05 | Payer: Medicare Other | Source: Ambulatory Visit

## 2014-10-20 ENCOUNTER — Other Ambulatory Visit: Payer: Self-pay | Admitting: Internal Medicine

## 2014-11-08 ENCOUNTER — Telehealth (HOSPITAL_COMMUNITY): Payer: Self-pay | Admitting: Interventional Radiology

## 2014-11-08 NOTE — Telephone Encounter (Signed)
Have several attempts to contact pt and finally got her on the phone today. Pt stated that she does not have back pain and hasn't been having back pain. I told her to call us back if she needed to in the future. She states understanding and is in agreement with this plan of care. JM

## 2014-11-17 ENCOUNTER — Emergency Department (HOSPITAL_COMMUNITY): Payer: Federal, State, Local not specified - PPO

## 2014-11-17 ENCOUNTER — Encounter (HOSPITAL_COMMUNITY): Payer: Self-pay | Admitting: Emergency Medicine

## 2014-11-17 ENCOUNTER — Emergency Department (HOSPITAL_COMMUNITY)
Admission: EM | Admit: 2014-11-17 | Discharge: 2014-11-18 | Disposition: A | Discharge status: Home / Self Care | Payer: Federal, State, Local not specified - PPO | Attending: Emergency Medicine | Admitting: Emergency Medicine

## 2014-11-17 DIAGNOSIS — T148XXA Other injury of unspecified body region, initial encounter: Secondary | ICD-10-CM

## 2014-11-17 DIAGNOSIS — S62101A Fracture of unspecified carpal bone, right wrist, initial encounter for closed fracture: Secondary | ICD-10-CM

## 2014-11-17 DIAGNOSIS — E559 Vitamin D deficiency, unspecified: Secondary | ICD-10-CM | POA: Insufficient documentation

## 2014-11-17 DIAGNOSIS — E785 Hyperlipidemia, unspecified: Secondary | ICD-10-CM | POA: Insufficient documentation

## 2014-11-17 DIAGNOSIS — Z8739 Personal history of other diseases of the musculoskeletal system and connective tissue: Secondary | ICD-10-CM | POA: Diagnosis not present

## 2014-11-17 DIAGNOSIS — Z79899 Other long term (current) drug therapy: Secondary | ICD-10-CM | POA: Insufficient documentation

## 2014-11-17 DIAGNOSIS — F0390 Unspecified dementia without behavioral disturbance: Secondary | ICD-10-CM | POA: Insufficient documentation

## 2014-11-17 DIAGNOSIS — Y998 Other external cause status: Secondary | ICD-10-CM | POA: Insufficient documentation

## 2014-11-17 DIAGNOSIS — S52591A Other fractures of lower end of right radius, initial encounter for closed fracture: Secondary | ICD-10-CM | POA: Diagnosis not present

## 2014-11-17 DIAGNOSIS — W19XXXA Unspecified fall, initial encounter: Secondary | ICD-10-CM

## 2014-11-17 DIAGNOSIS — N184 Chronic kidney disease, stage 4 (severe): Secondary | ICD-10-CM | POA: Insufficient documentation

## 2014-11-17 DIAGNOSIS — I129 Hypertensive chronic kidney disease with stage 1 through stage 4 chronic kidney disease, or unspecified chronic kidney disease: Secondary | ICD-10-CM | POA: Diagnosis not present

## 2014-11-17 DIAGNOSIS — S0083XA Contusion of other part of head, initial encounter: Secondary | ICD-10-CM | POA: Insufficient documentation

## 2014-11-17 DIAGNOSIS — S6991XA Unspecified injury of right wrist, hand and finger(s), initial encounter: Secondary | ICD-10-CM | POA: Diagnosis present

## 2014-11-17 DIAGNOSIS — Z8669 Personal history of other diseases of the nervous system and sense organs: Secondary | ICD-10-CM | POA: Diagnosis not present

## 2014-11-17 DIAGNOSIS — Z7982 Long term (current) use of aspirin: Secondary | ICD-10-CM | POA: Insufficient documentation

## 2014-11-17 DIAGNOSIS — W109XXA Fall (on) (from) unspecified stairs and steps, initial encounter: Secondary | ICD-10-CM | POA: Insufficient documentation

## 2014-11-17 DIAGNOSIS — Y9389 Activity, other specified: Secondary | ICD-10-CM | POA: Diagnosis not present

## 2014-11-17 DIAGNOSIS — Y92014 Private driveway to single-family (private) house as the place of occurrence of the external cause: Secondary | ICD-10-CM | POA: Diagnosis not present

## 2014-11-17 MED ORDER — TRAMADOL HCL 50 MG PO TABS: 50.0000 mg | ORAL_TABLET | Freq: Four times a day (QID) | ORAL | Status: AC | PRN

## 2014-11-17 MED ORDER — LIDOCAINE-EPINEPHRINE 2 %-1:100000 IJ SOLN
20.0000 mL | Freq: Once | INTRAMUSCULAR | Status: AC
  Administered 2014-11-17: 20 mL
  Filled 2014-11-17: qty 1

## 2014-11-17 NOTE — ED Provider Notes (Signed)
CSN: [id number]     Arrival date & time 11/17/14  2028 History   First MD Initiated Contact with Patient 11/17/14 2049     Chief Complaint  Patient presents with  . Fall  . Wrist Injury  . Head Injury     (Consider location/radiation/quality/duration/timing/severity/associated sxs/prior Treatment) HPI  Kimberly Avery Is an 81 year old female who presents emergency Department with chief complaint of fall. The patient states that she lost her footing when coming down a step today off of her driveway. She states she fell forward, but does not remember exactly how she injured her right wrist got a hematoma on the right side of her head. She denies losing consciousness. She states that she was able to pull herself up using a small tree located by the side of her driveway. She got to the house and called for help. She has some pain in her wrist. She denies headache, change in vision. She takes aspirin but no other blood thinners. Patient lives at home by herself. She has a history of dementia  Past Medical History  Diagnosis Date  . Hypertension   . Hyperlipidemia   . Gout   . Osteoporosis   . CKD (chronic kidney disease) stage 4, GFR 15-29 ml/min (HCC)   . Glaucoma   . Vitamin D deficiency    Past Surgical History  Procedure Laterality Date  . Breast surgery Bilateral 1970    mastectomies - fiboradenoma  . Eye surgery  2003, 2004  . Temporal artery biopsy / ligation  2001  . Femur im nail Right 04/21/2014    Procedure: right proximal femur ORIF ;  Surgeon: Samson Frederic, MD;  Location: [facility];  Service: Orthopedics;  Laterality: Right;  . Femur im nail Left 05/19/2014    Procedure: IM NAIL LEFT HIP;  Surgeon: Samson Frederic, MD;  Location: [facility];  Service: Orthopedics;  Laterality: Left;   Family History  Problem Relation Age of Onset  . Heart attack Mother     MI while in hospital, RA, HTN  . Stroke Father     MI, CAD, alcoholism  . Cancer Brother     prostate cancer    . Heart Problems Sister     pacemaker, DM, RA, HTN   Social History  Substance Use Topics  . Smoking status: Never Smoker   . Smokeless tobacco: Never Used  . Alcohol Use: No   OB History    No data available     Review of Systems  Ten systems reviewed and are negative for acute change, except as noted in the HPI.    Allergies  Review of patient's allergies indicates no known allergies.  Home Medications   Prior to Admission medications   Medication Sig Start Date End Date Taking? Authorizing Provider  acetaminophen (TYLENOL) 325 MG tablet Take 2 tablets (650 mg total) by mouth every 4 (four) hours as needed for mild pain (temp > 101.5). Patient not taking: Reported on 09/18/2014 04/24/14   Maryruth Bun Rama, MD  ALPRAZolam Prudy Feeler) 0.5 MG tablet Take 0.5-1 tablets (0.25-0.5 mg total) by mouth at bedtime as needed for anxiety. 05/24/14   Rodolph Bong, MD  aspirin 81 MG tablet Take 81 mg by mouth daily.    Historical Provider, MD  docusate sodium (COLACE) 100 MG capsule Take 1 capsule (100 mg total) by mouth 2 (two) times daily. Patient not taking: Reported on 09/18/2014 04/24/14   Maryruth Bun Rama, MD  dorzolamide-timolol (COSOPT) 22.3-6.8 MG/ML ophthalmic  solution Place 1 drop into both eyes 2 (two) times daily. 09/19/13   Historical Provider, MD  enoxaparin (LOVENOX) 30 MG/0.3ML injection Inject 0.3 mLs (30 mg total) into the skin daily. Patient not taking: Reported on 09/18/2014 05/24/14   Rodolph Bonganiel Thompson V, MD  feeding supplement, RESOURCE BREEZE, (RESOURCE BREEZE) LIQD Take 1 Container by mouth 3 (three) times daily between meals. Patient not taking: Reported on 09/18/2014 05/24/14   Rodolph Bonganiel Thompson V, MD  furosemide (LASIX) 20 MG tablet Take 1 tablet by mouth daily. 05/18/14   Historical Provider, MD  hydrALAZINE (APRESOLINE) 10 MG tablet Take 1 tablet (10 mg total) by mouth every 8 (eight) hours. Patient taking differently: Take 10 mg by mouth 2 (two) times daily.  05/24/14   Rodolph Bonganiel  Thompson V, MD  HYDROcodone-acetaminophen (NORCO/VICODIN) 5-325 MG per tablet Take 1-2 tablets by mouth every 6 (six) hours as needed for moderate pain. Patient not taking: Reported on 09/18/2014 05/24/14   Rodolph Bonganiel Thompson V, MD  levofloxacin (LEVAQUIN) 500 MG tablet Take 1 tablet (500 mg total) by mouth every other day. Take for 4 days then stop. Patient not taking: Reported on 09/18/2014 05/24/14   Rodolph Bonganiel Thompson V, MD  metoprolol tartrate (LOPRESSOR) 12.5 mg TABS tablet Take 12.5 mg by mouth 2 (two) times daily.    Historical Provider, MD  pantoprazole (PROTONIX) 40 MG tablet Take 1 tablet (40 mg total) by mouth daily. Patient not taking: Reported on 09/18/2014 05/24/14   Rodolph Bonganiel Thompson V, MD  potassium chloride 20 MEQ TBCR Take 20 mEq by mouth daily. 05/24/14   Rodolph Bonganiel Thompson V, MD  senna (SENOKOT) 8.6 MG TABS tablet Take 1 tablet (8.6 mg total) by mouth 2 (two) times daily. Patient not taking: Reported on 09/18/2014 04/24/14   Maryruth Bunhristina P Rama, MD  Simethicone (GAS-X PO) Take 1 tablet by mouth 2 (two) times daily.    Historical Provider, MD  simethicone (MYLICON) 40 MG/0.6ML drops Take 1.2 mLs (80 mg total) by mouth 4 (four) times daily. Take for 4 days then take every 6 hours as needed. Patient not taking: Reported on 09/18/2014 05/24/14   Rodolph Bonganiel Thompson V, MD  simvastatin (ZOCOR) 20 MG tablet Take 1 tablet by mouth daily. 08/27/13   Historical Provider, MD  traMADol (ULTRAM) 50 MG tablet Take 1 tablet (50 mg total) by mouth every 6 (six) hours as needed for moderate pain or severe pain. 05/24/14   Rodolph Bonganiel Thompson V, MD  Vitamin D, Ergocalciferol, (DRISDOL) 50000 UNITS CAPS capsule Take 1 capsule by mouth every 14 (fourteen) days. Fridays 09/17/13   Historical Provider, MD   BP 177/81 mmHg  Pulse 58  Temp(Src) 98.3 F (36.8 C) (Oral)  Resp 18  SpO2 97% Physical Exam  Constitutional: She is oriented to person, place, and time. She appears well-developed and well-nourished. No distress.  HENT:  Head:  Normocephalic and atraumatic.    Eyes: Conjunctivae and EOM are normal. Pupils are equal, round, and reactive to light. No scleral icterus.  No pain with eye movement  Neck: Normal range of motion.  Cardiovascular: Normal rate, regular rhythm and normal heart sounds.  Exam reveals no gallop and no friction rub.   No murmur heard. Pulmonary/Chest: Effort normal and breath sounds normal. No respiratory distress.  Abdominal: Soft. Bowel sounds are normal. She exhibits no distension and no mass. There is no tenderness. There is no guarding.  Musculoskeletal:  Right wrist with noted angulation and deformity, swelling and bruising, neurovascularly intact. Pain is minimal.  Neurological:  She is alert and oriented to person, place, and time.  GCS 15  Skin: Skin is warm and dry. She is not diaphoretic.    ED Course  Reduction of fracture Date/Time: 11/18/2014 12:55 AM Performed by: Arthor Captain Authorized by: Arthor Captain Consent: Verbal consent obtained. Risks and benefits: risks, benefits and alternatives were discussed Consent given by: patient Patient identity confirmed: provided demographic data Time out: Immediately prior to procedure a "time out" was called to verify the correct patient, procedure, equipment, support staff and site/side marked as required. Preparation: Patient was prepped and draped in the usual sterile fashion. Local anesthesia used: yes Anesthesia: local infiltration Local anesthetic: lidocaine 2% with epinephrine Anesthetic total: 10 ml Patient sedated: no Patient tolerance: Patient tolerated the procedure well with no immediate complications   (including critical care time) Labs Review Labs Reviewed - No data to display  Imaging Review Dg Wrist Complete Right  11/17/2014  CLINICAL DATA:  81 year old female with history of trauma from a fall while walking down some steps today. Right wrist injury with right wrist pain. EXAM: RIGHT WRIST - COMPLETE 3+  VIEW COMPARISON:  Right wrist radiograph 09/18/2014. FINDINGS: New acute displaced impacted and dorsally angulated fracture of the distal right radius. There is approximately 40 degrees of dorsal angulation. 3 mm of dorsal displacement. This fracture appears to be mildly comminuted, but it is uncertain whether or not the fracture line extends to the articular surface. The distal radius is grossly irregular related to an old healed radial fracture. Old healed distal ulnar fracture is noted, without definite evidence of an acute distal ulnar fracture. The carpals appear grossly intact, but evaluation is somewhat limited by nonstandard projections. 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 IMPRESSION: 1. Acute displaced impacted and dorsally angulated fracture of the distal right radius, as above. This may be mildly comminuted, and it is uncertain whether or not there is intra-articular extension of the fracture on today's examination. 2. Old healed fractures of the distal radius and ulna are again noted. Electronically Signed   By: Trudie Reed M.D.   On: 11/17/2014 21:12   I have personally reviewed and evaluated these images and lab results as part of my medical decision-making.   EKG Interpretation None       SPLINT APPLICATION  Authorized by: Arthor Captain Consent: Verbal consent obtained. Risks and benefits: risks, benefits and alternatives were discussed Consent given by: patient Splint applied by: orthopedic technician Location details: Right wrist Splint type: plaster, sugartong Supplies used: plaster Post-procedure: The splinted body part was neurovascularly unchanged following the procedure. Patient tolerance: Patient tolerated the procedure well with no immediate complications.     MDM   Final diagnoses:  Fall  Wrist fracture, right, closed, initial encounter  Hematoma    Patient with fall and wrist fracture. Fracture reduced  and splinted with NV intact. CT head and neck negative. Patient will be discharged to follow up with Dr. Melvyn Novas. Patient seen in shared visit with attending physician.     Arthor Captain, PA-C 11/18/14 0454  Elwin Mocha, MD 11/20/14 [id number]

## 2014-11-17 NOTE — ED Notes (Signed)
MD at bedside. 

## 2014-11-17 NOTE — ED Notes (Signed)
Pt states she stepped off her driveway wrong and fell this evening around 8pm  Pt has a hematoma noted to her forehead on the right side and a injury with deformity noted to her right wrist  Splint in place upon arrival  Denies LOC

## 2014-11-17 NOTE — Discharge Instructions (Signed)
Wrist Fracture °A wrist fracture is a break or crack in one of the bones of your wrist. Your wrist is made up of eight small bones at the palm of your hand (carpal bones) and two long bones that make up your forearm (radius and ulna). °CAUSES °· A direct blow to the wrist. °· Falling on an outstretched hand. °· Trauma, such as a car accident or a fall. °RISK FACTORS °Risk factors for wrist fracture include: °· Participating in contact and high-risk sports, such as skiing, biking, and ice skating. °· Taking steroid medicines. °· Smoking. °· Being female. °· Being Caucasian. °· Drinking more than three alcoholic beverages per day. °· Having low or lowered bone density (osteoporosis or osteopenia). °· Age. Older adults have decreased bone density. °· Women who have had menopause. °· History of previous fractures. °SIGNS AND SYMPTOMS °Symptoms of wrist fractures include tenderness, bruising, and inflammation. Additionally, the wrist may hang in an odd position or appear deformed. °DIAGNOSIS °Diagnosis may include: °· Physical exam. °· X-ray. °TREATMENT °Treatment depends on many factors, including the nature and location of the fracture, your age, and your activity level. Treatment for wrist fracture can be nonsurgical or surgical. °Nonsurgical Treatment °A plaster cast or splint may be applied to your wrist if the bone is in a good position. If the fracture is not in good position, it may be necessary for your health care provider to realign it before applying a splint or cast. Usually, a cast or splint will be worn for several weeks. °Surgical Treatment °Sometimes the position of the bone is so far out of place that surgery is required to apply a device to hold it together as it heals. Depending on the fracture, there are a number of options for holding the bone in place while it heals, such as a cast and metal pins. °HOME CARE INSTRUCTIONS °· Keep your injured wrist elevated and move your fingers as much as  possible. °· Do not put pressure on any part of your cast or splint. It may break. °· Use a plastic bag to protect your cast or splint from water while bathing or showering. Do not lower your cast or splint into water. °· Take medicines only as directed by your health care provider. °· Keep your cast or splint clean and dry. If it becomes wet, damaged, or suddenly feels too tight, contact your health care provider right away. °· Do not use any tobacco products including cigarettes, chewing tobacco, or electronic cigarettes. Tobacco can delay bone healing. If you need help quitting, ask your health care provider. °· Keep all follow-up visits as directed by your health care provider. This is important. °· Ask your health care provider if you should take supplements of calcium and vitamins C and D to promote bone healing. °SEEK MEDICAL CARE IF: °· Your cast or splint is damaged, breaks, or gets wet. °· You have a fever. °· You have chills. °· You have continued severe pain or more swelling than you did before the cast was put on. °SEEK IMMEDIATE MEDICAL CARE IF: °· Your hand or fingernails on the injured arm turn blue or gray, or feel cold or numb. °· You have decreased feeling in the fingers of your injured arm. °MAKE SURE YOU: °· Understand these instructions. °· Will watch your condition. °· Will get help right away if you are not doing well or get worse. °  °This information is not intended to replace advice given to you by your   health care provider. Make sure you discuss any questions you have with your health care provider. °  °Document Released: 10/18/2004 Document Revised: 09/29/2014 Document Reviewed: 01/26/2011 °Elsevier Interactive Patient Education ©2016 [employer] ° °Cast or Splint Care °Casts and splints support injured limbs and keep bones from moving while they heal. It is important to care for your cast or splint at home.   °HOME CARE INSTRUCTIONS °· Keep the cast or splint uncovered during the  drying period. It can take 24 to 48 hours to dry if it is made of plaster. A fiberglass cast will dry in less than 1 hour. °· Do not rest the cast on anything harder than a pillow for the first 24 hours. °· Do not put weight on your injured limb or apply pressure to the cast until your health care provider gives you permission. °· Keep the cast or splint dry. Wet casts or splints can lose their shape and may not support the limb as well. A wet cast that has lost its shape can also create harmful pressure on your skin when it dries. Also, wet skin can become infected. °¨ Cover the cast or splint with a plastic bag when bathing or when out in the rain or snow. If the cast is on the trunk of the body, take sponge baths until the cast is removed. °¨ If your cast does become wet, dry it with a towel or a blow dryer on the cool setting only. °· Keep your cast or splint clean. Soiled casts may be wiped with a moistened cloth. °· Do not place any hard or soft foreign objects under your cast or splint, such as cotton, toilet paper, lotion, or powder. °· Do not try to scratch the skin under the cast with any object. The object could get stuck inside the cast. Also, scratching could lead to an infection. If itching is a problem, use a blow dryer on a cool setting to relieve discomfort. °· Do not trim or cut your cast or remove padding from inside of it. °· Exercise all joints next to the injury that are not immobilized by the cast or splint. For example, if you have a long leg cast, exercise the hip joint and toes. If you have an arm cast or splint, exercise the shoulder, elbow, thumb, and fingers. °· Elevate your injured arm or leg on 1 or 2 pillows for the first 1 to 3 days to decrease swelling and pain. It is best if you can comfortably elevate your cast so it is higher than your heart. °SEEK MEDICAL CARE IF:  °· Your cast or splint cracks. °· Your cast or splint is too tight or too loose. °· You have unbearable itching  inside the cast. °· Your cast becomes wet or develops a soft spot or area. °· You have a bad smell coming from inside your cast. °· You get an object stuck under your cast. °· Your skin around the cast becomes red or raw. °· You have new pain or worsening pain after the cast has been applied. °SEEK IMMEDIATE MEDICAL CARE IF:  °· You have fluid leaking through the cast. °· You are unable to move your fingers or toes. °· You have discolored (blue or white), cool, painful, or very swollen fingers or toes beyond the cast. °· You have tingling or numbness around the injured area. °· You have severe pain or pressure under the cast. °· You have any difficulty with your breathing or have shortness of   breath. °· You have chest pain. °  °This information is not intended to replace advice given to you by your health care provider. Make sure you discuss any questions you have with your health care provider. °  °Document Released: 01/06/2000 Document Revised: 10/29/2012 Document Reviewed: 07/17/2012 °Elsevier Interactive Patient Education ©2016 [employer] ° °

## 2014-12-20 ENCOUNTER — Ambulatory Visit: Payer: Medicare Other | Admitting: Physician Assistant

## 2015-01-18 ENCOUNTER — Other Ambulatory Visit: Payer: Self-pay | Admitting: Internal Medicine

## 2015-01-19 ENCOUNTER — Other Ambulatory Visit: Payer: Self-pay | Admitting: Internal Medicine

## 2015-02-05 ENCOUNTER — Emergency Department (HOSPITAL_COMMUNITY)
Admission: EM | Admit: 2015-02-05 | Discharge: 2015-02-05 | Disposition: A | Discharge status: Home / Self Care | Payer: Federal, State, Local not specified - PPO | Attending: Emergency Medicine | Admitting: Emergency Medicine

## 2015-02-05 ENCOUNTER — Emergency Department (HOSPITAL_COMMUNITY): Payer: Federal, State, Local not specified - PPO

## 2015-02-05 ENCOUNTER — Encounter (HOSPITAL_COMMUNITY): Payer: Self-pay

## 2015-02-05 DIAGNOSIS — Z79899 Other long term (current) drug therapy: Secondary | ICD-10-CM | POA: Insufficient documentation

## 2015-02-05 DIAGNOSIS — Y9289 Other specified places as the place of occurrence of the external cause: Secondary | ICD-10-CM | POA: Insufficient documentation

## 2015-02-05 DIAGNOSIS — I129 Hypertensive chronic kidney disease with stage 1 through stage 4 chronic kidney disease, or unspecified chronic kidney disease: Secondary | ICD-10-CM | POA: Insufficient documentation

## 2015-02-05 DIAGNOSIS — E559 Vitamin D deficiency, unspecified: Secondary | ICD-10-CM | POA: Diagnosis not present

## 2015-02-05 DIAGNOSIS — N184 Chronic kidney disease, stage 4 (severe): Secondary | ICD-10-CM | POA: Diagnosis not present

## 2015-02-05 DIAGNOSIS — H409 Unspecified glaucoma: Secondary | ICD-10-CM | POA: Diagnosis not present

## 2015-02-05 DIAGNOSIS — W010XXA Fall on same level from slipping, tripping and stumbling without subsequent striking against object, initial encounter: Secondary | ICD-10-CM | POA: Insufficient documentation

## 2015-02-05 DIAGNOSIS — S79912A Unspecified injury of left hip, initial encounter: Secondary | ICD-10-CM | POA: Diagnosis not present

## 2015-02-05 DIAGNOSIS — Z7982 Long term (current) use of aspirin: Secondary | ICD-10-CM | POA: Diagnosis not present

## 2015-02-05 DIAGNOSIS — M810 Age-related osteoporosis without current pathological fracture: Secondary | ICD-10-CM | POA: Diagnosis not present

## 2015-02-05 DIAGNOSIS — E785 Hyperlipidemia, unspecified: Secondary | ICD-10-CM | POA: Insufficient documentation

## 2015-02-05 DIAGNOSIS — Y998 Other external cause status: Secondary | ICD-10-CM | POA: Diagnosis not present

## 2015-02-05 DIAGNOSIS — W19XXXA Unspecified fall, initial encounter: Secondary | ICD-10-CM

## 2015-02-05 DIAGNOSIS — S3991XA Unspecified injury of abdomen, initial encounter: Secondary | ICD-10-CM | POA: Diagnosis not present

## 2015-02-05 DIAGNOSIS — Y9389 Activity, other specified: Secondary | ICD-10-CM | POA: Diagnosis not present

## 2015-02-05 LAB — I-STAT TROPONIN, ED: TROPONIN I, POC: 0 ng/mL (ref 0.00–0.08)

## 2015-02-05 NOTE — ED Provider Notes (Signed)
CSN: [id number]     Arrival date & time 02/05/15  1251 History   First MD Initiated Contact with Patient 02/05/15 1309     Chief Complaint  Patient presents with  . Fall  . Groin Pain   HPI   Kimberly Avery is a 82 y.o. M PMH significant for HTN, HLD, CKD stage IV, osteoporosis presenting with left pubic pain since a mechanical fall two days ago. She landed on her buttocks. She states after she fell, she had a 2-3 minute episode of left sided chest pressure, non-radiating, constant during the episode, with associated palpitations. No LOC, head injury, HA, SOB, N/V.   Past Medical History  Diagnosis Date  . Hypertension   . Hyperlipidemia   . Gout   . Osteoporosis   . CKD (chronic kidney disease) stage 4, GFR 15-29 ml/min (HCC)   . Glaucoma   . Vitamin D deficiency    Past Surgical History  Procedure Laterality Date  . Breast surgery Bilateral 1970    mastectomies - fiboradenoma  . Eye surgery  2003, 2004  . Temporal artery biopsy / ligation  2001  . Femur im nail Right 04/21/2014    Procedure: right proximal femur ORIF ;  Surgeon: Samson Frederic, MD;  Location: [facility];  Service: Orthopedics;  Laterality: Right;  . Femur im nail Left 05/19/2014    Procedure: IM NAIL LEFT HIP;  Surgeon: Samson Frederic, MD;  Location: [facility];  Service: Orthopedics;  Laterality: Left;   Family History  Problem Relation Age of Onset  . Heart attack Mother     MI while in hospital, RA, HTN  . Stroke Father     MI, CAD, alcoholism  . Cancer Brother     prostate cancer  . Heart Problems Sister     pacemaker, DM, RA, HTN   Social History  Substance Use Topics  . Smoking status: Never Smoker   . Smokeless tobacco: Never Used  . Alcohol Use: No   OB History    No data available     Review of Systems  Ten systems are reviewed and are negative for acute change except as noted in the HPI  Allergies  Review of patient's allergies indicates no known allergies.  Home Medications    Prior to Admission medications   Medication Sig Start Date End Date Taking? Authorizing Provider  acetaminophen (TYLENOL) 325 MG tablet Take 2 tablets (650 mg total) by mouth every 4 (four) hours as needed for mild pain (temp > 101.5). 04/24/14  Yes Christina P Rama, MD  ALPRAZolam (XANAX) 0.5 MG tablet Take 0.25 mg by mouth every 8 (eight) hours as needed. Anxiety 09/30/13  Yes Historical Provider, MD  aspirin 81 MG tablet Take 81 mg by mouth daily.   Yes Historical Provider, MD  dorzolamide-timolol (COSOPT) 22.3-6.8 MG/ML ophthalmic solution Place 1 drop into both eyes 2 (two) times daily. 09/19/13  Yes Historical Provider, MD  furosemide (LASIX) 20 MG tablet Take 20 mg by mouth daily.  05/18/14  Yes Historical Provider, MD  hydrALAZINE (APRESOLINE) 10 MG tablet Take 1 tablet (10 mg total) by mouth every 8 (eight) hours. Patient taking differently: Take 10 mg by mouth 2 (two) times daily.  05/24/14  Yes Rodolph Bong, MD  KLOR-CON M20 20 MEQ tablet Take 20 mEq by mouth daily. 02/01/15  Yes Historical Provider, MD  metoprolol tartrate (LOPRESSOR) 12.5 mg TABS tablet Take 12.5 mg by mouth 2 (two) times daily.   Yes Historical  Provider, MD  Multiple Vitamins-Minerals (DECUBI-VITE PO) Take 1 tablet by mouth daily.   Yes Historical Provider, MD  simvastatin (ZOCOR) 20 MG tablet Take 20 mg by mouth daily.  08/27/13  Yes Historical Provider, MD  traMADol (ULTRAM) 50 MG tablet Take 1 tablet (50 mg total) by mouth every 6 (six) hours as needed. Patient taking differently: Take 50 mg by mouth every 6 (six) hours as needed for moderate pain.  11/17/14  Yes Arthor Captain, PA-C  alendronate (FOSAMAX) 70 MG tablet Take 70 mg by mouth once a week. Take with a full glass of water on an empty stomach. On Fridays    Historical Provider, MD  ALPRAZolam Prudy Feeler) 0.5 MG tablet Take 0.5-1 tablets (0.25-0.5 mg total) by mouth at bedtime as needed for anxiety. Patient not taking: Reported on 02/05/2015 05/24/14   Rodolph Bong, MD  docusate sodium (COLACE) 100 MG capsule Take 1 capsule (100 mg total) by mouth 2 (two) times daily. Patient not taking: Reported on 09/18/2014 04/24/14   Maryruth Bun Rama, MD  enoxaparin (LOVENOX) 30 MG/0.3ML injection Inject 0.3 mLs (30 mg total) into the skin daily. Patient not taking: Reported on 09/18/2014 05/24/14   Rodolph Bong, MD  feeding supplement, RESOURCE BREEZE, (RESOURCE BREEZE) LIQD Take 1 Container by mouth 3 (three) times daily between meals. Patient not taking: Reported on 09/18/2014 05/24/14   Rodolph Bong, MD  HYDROcodone-acetaminophen (NORCO/VICODIN) 5-325 MG per tablet Take 1-2 tablets by mouth every 6 (six) hours as needed for moderate pain. Patient not taking: Reported on 09/18/2014 05/24/14   Rodolph Bong, MD  levofloxacin (LEVAQUIN) 500 MG tablet Take 1 tablet (500 mg total) by mouth every other day. Take for 4 days then stop. Patient not taking: Reported on 09/18/2014 05/24/14   Rodolph Bong, MD  pantoprazole (PROTONIX) 40 MG tablet Take 1 tablet (40 mg total) by mouth daily. Patient not taking: Reported on 09/18/2014 05/24/14   Rodolph Bong, MD  potassium chloride 20 MEQ TBCR Take 20 mEq by mouth daily. Patient not taking: Reported on 02/05/2015 05/24/14   Rodolph Bong, MD  senna (SENOKOT) 8.6 MG TABS tablet Take 1 tablet (8.6 mg total) by mouth 2 (two) times daily. Patient not taking: Reported on 09/18/2014 04/24/14   Maryruth Bun Rama, MD  simethicone (MYLICON) 40 MG/0.6ML drops Take 1.2 mLs (80 mg total) by mouth 4 (four) times daily. Take for 4 days then take every 6 hours as needed. Patient not taking: Reported on 11/17/2014 05/24/14   Rodolph Bong, MD  Vitamin D, Ergocalciferol, (DRISDOL) 50000 UNITS CAPS capsule Take 1 capsule by mouth every 14 (fourteen) days. THURSDAYS 09/17/13   Historical Provider, MD   BP 187/70 mmHg  Pulse 68  Temp(Src) 97.6 F (36.4 C) (Oral)  Resp 14  SpO2 99% Physical Exam  Constitutional: She is oriented  to person, place, and time. She appears well-developed and well-nourished. No distress.  HENT:  Head: Normocephalic and atraumatic.  Mouth/Throat: Oropharynx is clear and moist. No oropharyngeal exudate.  Eyes: Conjunctivae are normal. Pupils are equal, round, and reactive to light. Right eye exhibits no discharge. Left eye exhibits no discharge. No scleral icterus.  Neck: Normal range of motion. No tracheal deviation present.  Cardiovascular: Normal rate, regular rhythm, normal heart sounds and intact distal pulses.  Exam reveals no gallop and no friction rub.   No murmur heard. Pulmonary/Chest: Effort normal and breath sounds normal. No respiratory distress. She has no wheezes. She  has no rales. She exhibits no tenderness.  Abdominal: Soft. Bowel sounds are normal. She exhibits no distension and no mass. There is no tenderness. There is no rebound and no guarding.  Musculoskeletal: She exhibits tenderness. She exhibits no edema.  Left hip and pubic symphysis tenderness.   Lymphadenopathy:    She has no cervical adenopathy.  Neurological: She is alert and oriented to person, place, and time. No cranial nerve deficit. Coordination normal.  Skin: Skin is warm and dry. No rash noted. She is not diaphoretic. No erythema.  Psychiatric: She has a normal mood and affect. Her behavior is normal.  Nursing note and vitals reviewed.   ED Course  Procedures  Labs Review Labs Reviewed  Rosezena Sensor, ED    Imaging Review Dg Chest 2 View  02/05/2015  CLINICAL DATA:  Fall EXAM: CHEST  2 VIEW COMPARISON:  05/20/2014 FINDINGS: Lungs are hyperaerated. Mild cardiomegaly. Chronic pleural changes at the lung bases. Central pulmonary vasculature is prominent with peripheral pruning. No new lung mass or consolidation. Osteopenia. New lower thoracic compression deformity estimated at T11. IMPRESSION: New T11 compression deformity. Cardiomegaly without decompensation Chronic changes. Electronically Signed    By: Jolaine Click M.D.   On: 02/05/2015 14:11   Dg Lumbar Spine Complete  02/05/2015  CLINICAL DATA:  Fall EXAM: LUMBAR SPINE - COMPLETE 4+ VIEW COMPARISON:  09/13/2014 FINDINGS: Minimal levoscoliosis at L2-3. Osteopenia. T12 wedge compression deformity is stable. No new compression fractures. Multilevel severe disc space narrowing. IMPRESSION: No acute bony pathology.  Stable chronic changes. Electronically Signed   By: Jolaine Click M.D.   On: 02/05/2015 14:06   Dg Hips Bilat With Pelvis Min 5 Views  02/05/2015  CLINICAL DATA:  Pain following fall 2 days prior EXAM: DG HIP (WITH OR WITHOUT PELVIS) 5+V BILAT COMPARISON:  May 19, 2014 FINDINGS: Frontal pelvis as well as frontal and lateral hip images bilaterally-total five views- were obtained. The patient has had previous fractures of both intertrochanteric regions with screw and rod fixation bilaterally. Fractures in these areas have healed. There is bony overgrowth medially in both proximal femur regions as well as bony overgrowth superior to each greater trochanter. There old healed fractures of the left superior pubic ramus and left ischium as well. No acute fracture or dislocation is evident. There is moderate narrowing of both hip joints. Sclerosis in the right femoral head suggests a degree of underlying avascular necrosis in this area. There are calcifications in the right pelvis, likely small calcified leiomyomas of the uterus. There is degenerative change in the visualized lower lumbar spine. IMPRESSION: Postoperative change bilaterally with bony overgrowth along each proximal femur. Old fractures of the left superior pubic ramus and ischium, healed. No acute fracture or dislocation evident. The moderate narrowing both hip joints. Evidence suggesting a degree of avascular necrosis in the right femoral head. Electronically Signed   By: Bretta Bang III M.D.   On: 02/05/2015 14:06   I have personally reviewed and evaluated these images and lab  results as part of my medical decision-making.   EKG Interpretation   Date/Time:  Saturday February 05 2015 14:12:06 EST Ventricular Rate:  54 PR Interval:  171 QRS Duration: 84 QT Interval:  462 QTC Calculation: 438 R Axis:   70 Text Interpretation:  Sinus rhythm Left ventricular hypertrophy Anterior Q  waves, possibly due to LVH No change since prior EKG Confirmed by LIU MD,  DANA ([id number]) on 02/05/2015 2:48:48 PM      MDM  Final diagnoses:  Fall, initial encounter   Patient non-toxic appearing and VSS. S/p mechanical fall. Although unlikely ACS based on patient history, will workup for this as well.  Troponin and EKG unremarkable for acute change.  No acute fractures on xrays of lumbar spine, chest, and pelvis/hips. There is a suggestion of a degree of avascular necrosis in the right femoral head. Informed patient of the results and advised ortho follow-up for avascular necrosis.  Patient may be safely discharged home. Discussed reasons for return. Patient to follow-up with primary care provider within one week. Provided referral to ortho. Patient in understanding and agreement with the plan.  Melton KrebsSamantha Nicole Jashayla Glatfelter, PA-C 02/07/15 1905  Lavera Guiseana Duo Liu, MD 02/08/15 [phone]

## 2015-02-05 NOTE — ED Notes (Signed)
She c/o tripped and fell whilst taking out trash two days ago.  She states she landed on her buttocks (didn't hit her head); and today c/o persistent left groin pain.  She is awake, alert and oriented x 4 with clear speech.

## 2015-02-05 NOTE — Discharge Instructions (Signed)
Ms. Mancel Parsonsrnestine E Lauf,  Nice meeting you! Please follow-up with orthopedics because your pelvis xray showed moderate narrowing of both hip joints and evidence suggesting a degree of avascular necrosis in the right femoral head. Return to the emergency department if you continue having pain, develop chest pain, shortness of breath, lose consciousness or control of your bladder/bowel. Feel better soon!  S. Lane HackerNicole Tanyiah Laurich, PA-C

## 2015-02-05 NOTE — ED Notes (Signed)
MD at bedside. 

## 2015-02-05 NOTE — ED Notes (Signed)
Bed: WA21 Expected date:  Expected time:  Means of arrival:  Comments: 

## 2015-02-05 NOTE — ED Notes (Signed)
Patient not in room, unable to get blood at this time

## 2015-04-06 ENCOUNTER — Emergency Department (HOSPITAL_COMMUNITY)
Admission: EM | Admit: 2015-04-06 | Discharge: 2015-04-06 | Disposition: A | Discharge status: Home / Self Care | Payer: Federal, State, Local not specified - PPO | Attending: Physician Assistant | Admitting: Physician Assistant

## 2015-04-06 ENCOUNTER — Emergency Department (HOSPITAL_COMMUNITY): Payer: Federal, State, Local not specified - PPO

## 2015-04-06 ENCOUNTER — Encounter (HOSPITAL_COMMUNITY): Payer: Self-pay | Admitting: Emergency Medicine

## 2015-04-06 DIAGNOSIS — S29001A Unspecified injury of muscle and tendon of front wall of thorax, initial encounter: Secondary | ICD-10-CM | POA: Diagnosis not present

## 2015-04-06 DIAGNOSIS — Z79899 Other long term (current) drug therapy: Secondary | ICD-10-CM | POA: Insufficient documentation

## 2015-04-06 DIAGNOSIS — H409 Unspecified glaucoma: Secondary | ICD-10-CM | POA: Diagnosis not present

## 2015-04-06 DIAGNOSIS — Y9289 Other specified places as the place of occurrence of the external cause: Secondary | ICD-10-CM | POA: Insufficient documentation

## 2015-04-06 DIAGNOSIS — E785 Hyperlipidemia, unspecified: Secondary | ICD-10-CM | POA: Diagnosis not present

## 2015-04-06 DIAGNOSIS — N184 Chronic kidney disease, stage 4 (severe): Secondary | ICD-10-CM | POA: Insufficient documentation

## 2015-04-06 DIAGNOSIS — Y9389 Activity, other specified: Secondary | ICD-10-CM | POA: Insufficient documentation

## 2015-04-06 DIAGNOSIS — W010XXA Fall on same level from slipping, tripping and stumbling without subsequent striking against object, initial encounter: Secondary | ICD-10-CM | POA: Insufficient documentation

## 2015-04-06 DIAGNOSIS — M810 Age-related osteoporosis without current pathological fracture: Secondary | ICD-10-CM | POA: Insufficient documentation

## 2015-04-06 DIAGNOSIS — I129 Hypertensive chronic kidney disease with stage 1 through stage 4 chronic kidney disease, or unspecified chronic kidney disease: Secondary | ICD-10-CM | POA: Diagnosis not present

## 2015-04-06 DIAGNOSIS — Y998 Other external cause status: Secondary | ICD-10-CM | POA: Insufficient documentation

## 2015-04-06 DIAGNOSIS — W19XXXA Unspecified fall, initial encounter: Secondary | ICD-10-CM

## 2015-04-06 DIAGNOSIS — S0993XA Unspecified injury of face, initial encounter: Secondary | ICD-10-CM | POA: Diagnosis present

## 2015-04-06 DIAGNOSIS — M109 Gout, unspecified: Secondary | ICD-10-CM | POA: Insufficient documentation

## 2015-04-06 DIAGNOSIS — Z7982 Long term (current) use of aspirin: Secondary | ICD-10-CM | POA: Diagnosis not present

## 2015-04-06 DIAGNOSIS — S00511A Abrasion of lip, initial encounter: Secondary | ICD-10-CM | POA: Insufficient documentation

## 2015-04-06 LAB — CBC
HEMATOCRIT: 37.7 % (ref 36.0–46.0)
Hemoglobin: 12.3 g/dL (ref 12.0–15.0)
MCH: 28.6 pg (ref 26.0–34.0)
MCHC: 32.6 g/dL (ref 30.0–36.0)
MCV: 87.7 fL (ref 78.0–100.0)
PLATELETS: 172 10*3/uL (ref 150–400)
RBC: 4.3 MIL/uL (ref 3.87–5.11)
RDW: 15 % (ref 11.5–15.5)
WBC: 5.7 10*3/uL (ref 4.0–10.5)

## 2015-04-06 LAB — BASIC METABOLIC PANEL
ANION GAP: 12 (ref 5–15)
BUN: 39 mg/dL — AB (ref 6–20)
CO2: 22 mmol/L (ref 22–32)
Calcium: 9.3 mg/dL (ref 8.9–10.3)
Chloride: 108 mmol/L (ref 101–111)
Creatinine, Ser: 1.69 mg/dL — ABNORMAL HIGH (ref 0.44–1.00)
GFR calc Af Amer: 30 mL/min — ABNORMAL LOW (ref >60)
GFR, EST NON AFRICAN AMERICAN: 26 mL/min — AB (ref >60)
Glucose, Bld: 101 mg/dL — ABNORMAL HIGH (ref 65–99)
Potassium: 3.6 mmol/L (ref 3.5–5.1)
SODIUM: 142 mmol/L (ref 135–145)

## 2015-04-06 LAB — I-STAT TROPONIN, ED: Troponin i, poc: 0.07 ng/mL (ref 0.00–0.08)

## 2015-04-06 MED ORDER — TETANUS-DIPHTH-ACELL PERTUSSIS 5-2.5-18.5 LF-MCG/0.5 IM SUSP
0.5000 mL | Freq: Once | INTRAMUSCULAR | Status: AC
Start: 2015-04-06 — End: 2015-04-06
  Administered 2015-04-06: 0.5 mL via INTRAMUSCULAR
  Filled 2015-04-06: qty 0.5

## 2015-04-06 MED ORDER — ACETAMINOPHEN 325 MG PO TABS
650.0000 mg | ORAL_TABLET | Freq: Once | ORAL | Status: AC
  Administered 2015-04-06: 650 mg via ORAL
  Filled 2015-04-06: qty 2

## 2015-04-06 MED ORDER — OXYCODONE-ACETAMINOPHEN 5-325 MG PO TABS
1.0000 | ORAL_TABLET | Freq: Once | ORAL | Status: AC
  Administered 2015-04-06: 1 via ORAL
  Filled 2015-04-06: qty 1

## 2015-04-06 NOTE — ED Provider Notes (Signed)
CSN: [id number]     Arrival date & time 04/06/15  [id number] History   First MD Initiated Contact with Patient 04/06/15 [phone]     Chief Complaint  Patient presents with  . Fall     (Consider location/radiation/quality/duration/timing/severity/associated sxs/prior Treatment) HPI   Patient is a very well-appearing 82 year old female. She has had hospital history except for hypertension hyperlipidemia and multiple ED visits after minor falls. Patient reports mechanical fall yesterday. She was stepping and missed a step and fell onto her front lip.  Patient had no loss of consciousness. She has no visible trauma to her head, mild abrasion less than 1 cm to her lip. Has no neck pain. Alert oriented 3.  She was ambulatory after fall.  Patient reports that since the fall she's been having some pain in her chest wall anteriorly that is worse with twisting motion or movement.    Past Medical History  Diagnosis Date  . Hypertension   . Hyperlipidemia   . Gout   . Osteoporosis   . CKD (chronic kidney disease) stage 4, GFR 15-29 ml/min (HCC)   . Glaucoma   . Vitamin D deficiency    Past Surgical History  Procedure Laterality Date  . Breast surgery Bilateral 1970    mastectomies - fiboradenoma  . Eye surgery  2003, 2004  . Temporal artery biopsy / ligation  2001  . Femur im nail Right 04/21/2014    Procedure: right proximal femur ORIF ;  Surgeon: Samson FredericBrian Swinteck, MD;  Location: [facility];  Service: Orthopedics;  Laterality: Right;  . Femur im nail Left 05/19/2014    Procedure: IM NAIL LEFT HIP;  Surgeon: Samson FredericBrian Swinteck, MD;  Location: [facility];  Service: Orthopedics;  Laterality: Left;   Family History  Problem Relation Age of Onset  . Heart attack Mother     MI while in hospital, RA, HTN  . Stroke Father     MI, CAD, alcoholism  . Cancer Brother     prostate cancer  . Heart Problems Sister     pacemaker, DM, RA, HTN   Social History  Substance Use Topics  . Smoking status: Never Smoker    . Smokeless tobacco: Never Used  . Alcohol Use: No   OB History    No data available     Review of Systems  Constitutional: Negative for activity change.  HENT: Negative for congestion and drooling.   Eyes: Negative for discharge.  Respiratory: Negative for shortness of breath.   Cardiovascular: Positive for chest pain.  Gastrointestinal: Negative for abdominal pain.  Genitourinary: Negative for dysuria.  Musculoskeletal: Negative for back pain, joint swelling and gait problem.  Neurological: Negative for dizziness, syncope and weakness.  Psychiatric/Behavioral: Negative for confusion.      Allergies  Review of patient's allergies indicates no known allergies.  Home Medications   Prior to Admission medications   Medication Sig Start Date End Date Taking? Authorizing Provider  acetaminophen (TYLENOL) 325 MG tablet Take 2 tablets (650 mg total) by mouth every 4 (four) hours as needed for mild pain (temp > 101.5). 04/24/14   Christina P Rama, MD  alendronate (FOSAMAX) 70 MG tablet Take 70 mg by mouth once a week. Take with a full glass of water on an empty stomach. On Fridays    Historical Provider, MD  ALPRAZolam Prudy Feeler(XANAX) 0.5 MG tablet Take 0.5-1 tablets (0.25-0.5 mg total) by mouth at bedtime as needed for anxiety. Patient not taking: Reported on 02/05/2015 05/24/14   Ramiro Harvestaniel Thompson  V, MD  ALPRAZolam (XANAX) 0.5 MG tablet Take 0.25 mg by mouth every 8 (eight) hours as needed. Anxiety 09/30/13   Historical Provider, MD  aspirin 81 MG tablet Take 81 mg by mouth daily.    Historical Provider, MD  docusate sodium (COLACE) 100 MG capsule Take 1 capsule (100 mg total) by mouth 2 (two) times daily. Patient not taking: Reported on 09/18/2014 04/24/14   Maryruth Bun Rama, MD  dorzolamide-timolol (COSOPT) 22.3-6.8 MG/ML ophthalmic solution Place 1 drop into both eyes 2 (two) times daily. 09/19/13   Historical Provider, MD  enoxaparin (LOVENOX) 30 MG/0.3ML injection Inject 0.3 mLs (30 mg total) into  the skin daily. Patient not taking: Reported on 09/18/2014 05/24/14   Rodolph Bong, MD  feeding supplement, RESOURCE BREEZE, (RESOURCE BREEZE) LIQD Take 1 Container by mouth 3 (three) times daily between meals. Patient not taking: Reported on 09/18/2014 05/24/14   Rodolph Bong, MD  furosemide (LASIX) 20 MG tablet Take 20 mg by mouth daily.  05/18/14   Historical Provider, MD  hydrALAZINE (APRESOLINE) 10 MG tablet Take 1 tablet (10 mg total) by mouth every 8 (eight) hours. Patient taking differently: Take 10 mg by mouth 2 (two) times daily.  05/24/14   Rodolph Bong, MD  HYDROcodone-acetaminophen (NORCO/VICODIN) 5-325 MG per tablet Take 1-2 tablets by mouth every 6 (six) hours as needed for moderate pain. Patient not taking: Reported on 09/18/2014 05/24/14   Rodolph Bong, MD  KLOR-CON M20 20 MEQ tablet Take 20 mEq by mouth daily. 02/01/15   Historical Provider, MD  levofloxacin (LEVAQUIN) 500 MG tablet Take 1 tablet (500 mg total) by mouth every other day. Take for 4 days then stop. Patient not taking: Reported on 09/18/2014 05/24/14   Rodolph Bong, MD  metoprolol tartrate (LOPRESSOR) 12.5 mg TABS tablet Take 12.5 mg by mouth 2 (two) times daily.    Historical Provider, MD  Multiple Vitamins-Minerals (DECUBI-VITE PO) Take 1 tablet by mouth daily.    Historical Provider, MD  pantoprazole (PROTONIX) 40 MG tablet Take 1 tablet (40 mg total) by mouth daily. Patient not taking: Reported on 09/18/2014 05/24/14   Rodolph Bong, MD  potassium chloride 20 MEQ TBCR Take 20 mEq by mouth daily. Patient not taking: Reported on 02/05/2015 05/24/14   Rodolph Bong, MD  senna (SENOKOT) 8.6 MG TABS tablet Take 1 tablet (8.6 mg total) by mouth 2 (two) times daily. Patient not taking: Reported on 09/18/2014 04/24/14   Maryruth Bun Rama, MD  simethicone (MYLICON) 40 MG/0.6ML drops Take 1.2 mLs (80 mg total) by mouth 4 (four) times daily. Take for 4 days then take every 6 hours as needed. Patient not taking:  Reported on 11/17/2014 05/24/14   Rodolph Bong, MD  simvastatin (ZOCOR) 20 MG tablet Take 20 mg by mouth daily.  08/27/13   Historical Provider, MD  traMADol (ULTRAM) 50 MG tablet Take 1 tablet (50 mg total) by mouth every 6 (six) hours as needed. Patient taking differently: Take 50 mg by mouth every 6 (six) hours as needed for moderate pain.  11/17/14   Arthor Captain, PA-C  Vitamin D, Ergocalciferol, (DRISDOL) 50000 UNITS CAPS capsule Take 1 capsule by mouth every 14 (fourteen) days. THURSDAYS 09/17/13   Historical Provider, MD   BP 177/73 mmHg  Pulse 65  Temp(Src) 97.4 F (36.3 C) (Oral)  Resp 16  SpO2 99% Physical Exam  Constitutional: She is oriented to person, place, and time. She appears well-developed and well-nourished.  HENT:  Head: Normocephalic and atraumatic.  Small abrasion to right lip.  Eyes: Conjunctivae are normal. Right eye exhibits no discharge.  Neck: Neck supple.  Cardiovascular: Normal rate, regular rhythm and normal heart sounds.   No murmur heard. Pulmonary/Chest: Effort normal and breath sounds normal. She has no wheezes. She has no rales.  Abdominal: Soft. She exhibits no distension. There is no tenderness.  Musculoskeletal: Normal range of motion. She exhibits no edema.  No C-spine tenderness  Full range motion of all 4 extremities  Neurological: She is oriented to person, place, and time. No cranial nerve deficit.  Skin: Skin is warm and dry. No rash noted. She is not diaphoretic.  Psychiatric: She has a normal mood and affect. Her behavior is normal.  Nursing note and vitals reviewed.   ED Course  Procedures (including critical care time) Labs Review Labs Reviewed  BASIC METABOLIC PANEL - Abnormal; Notable for the following:    Glucose, Bld 101 (*)    BUN 39 (*)    Creatinine, Ser 1.69 (*)    GFR calc non Af Amer 26 (*)    GFR calc Af Amer 30 (*)    All other components within normal limits  CBC  I-STAT TROPOININ, ED    Imaging Review Dg  Chest 2 View  04/06/2015  CLINICAL DATA:  Pain following fall EXAM: CHEST  2 VIEW COMPARISON:  February 05, 2015 FINDINGS: Lungs remain hyperexpanded. There is chronic blunting of the right costophrenic angle. There is no edema or consolidation. Heart size and pulmonary vascularity are normal. No adenopathy. No pneumothorax. There is stable mild lower thoracic dextroscoliosis. Anterior wedging of the T11 vertebral body is stable. No new fracture is evident. IMPRESSION: Lungs hyperexpanded without edema or consolidation. No change in cardiac silhouette. No acute fracture. Old fracture T11 vertebral body appears stable. No evident pneumothorax. Chronic blunting of the right costophrenic angle is likely due to scarring. Electronically Signed   By: Bretta Bang III M.D.   On: 04/06/2015 09:25   I have personally reviewed and evaluated these images and lab results as part of my medical decision-making.   EKG Interpretation   Date/Time:  Wednesday April 06 2015 08:48:34 EDT Ventricular Rate:  61 PR Interval:  160 QRS Duration: 97 QT Interval:  435 QTC Calculation: 438 R Axis:   85 Text Interpretation:  Sinus or ectopic atrial rhythm Atrial premature  complex Borderline right axis deviation Left ventricular hypertrophy  Anterior Q waves, possibly due to LVH Baseline wander in lead(s) V2 ST  elevation n V2, there on prior.  No reciprical depressions.  Confirmed by  Kandis Mannan ([id number]) on 04/06/2015 9:03:57 AM      MDM   Final diagnoses:  None    Patient is a 82 year old female presenting after mechanical fall yesterday. Patient has small abrasion to right lip. We will update Tdap. Does not require any  Head imaging this time given the length of time that has passed since injury with no change in altered mental status., No C-spine tenderness and baseline ambulation. Patient has mild chest discomfort with moving and twisting motion. Will get chest x-ray. We'll do single troponin given  that the pain has been going on since last night. If all this is normal plan to discharge home in the care of her daughter. Patient appears very well on exam. \  11:29 AM Getting ready to discharge patient and patient's daughter is upset because we did not give her Percocet. Patient is 82  years old and has been falling recently so I don;t think its  appropriate to give Percocet for home. We can give her one pill here to make patient's daughter happy. However patient does not appear any distress on exam. She has no injuries.   Octaviano Mukai Randall An, MD 04/06/15 [phone]

## 2015-04-06 NOTE — ED Notes (Addendum)
Upon discharging patient, daughter upset regarding given options for pain relief.  Pt states pain is 9/10 and does take vicodin at home for pain and she is out.  States she is not asking for script but doesn't understand why a hospital cannot give anything stronger than ibuprofen.  Ibuprofen doesn't work for her.  Notified MD,  Md gave order for percocet.  Pt medication.  Daughter then states pt was promised med for lip.  Bacitracin given per MD instruction and sample placed on lip.

## 2015-04-06 NOTE — Discharge Instructions (Signed)
We're happy to report that we do not see any broken bones and would like patient to return home and follow-up with her primary care physician.

## 2015-04-06 NOTE — ED Notes (Signed)
Per patient, states she tripped and fell face first last night-small lip lac on right, central chest pain

## 2015-04-23 DEATH — deceased

## 2015-11-02 IMAGING — CR DG CHEST 1V
1 series · 1 of 1 positions shown · non-contrast
Comparison: 04/17/2014

CLINICAL DATA: Left shoulder pain after fall.  Initial encounter.

EXAM:
CHEST  1 VIEW

[t chest supine]
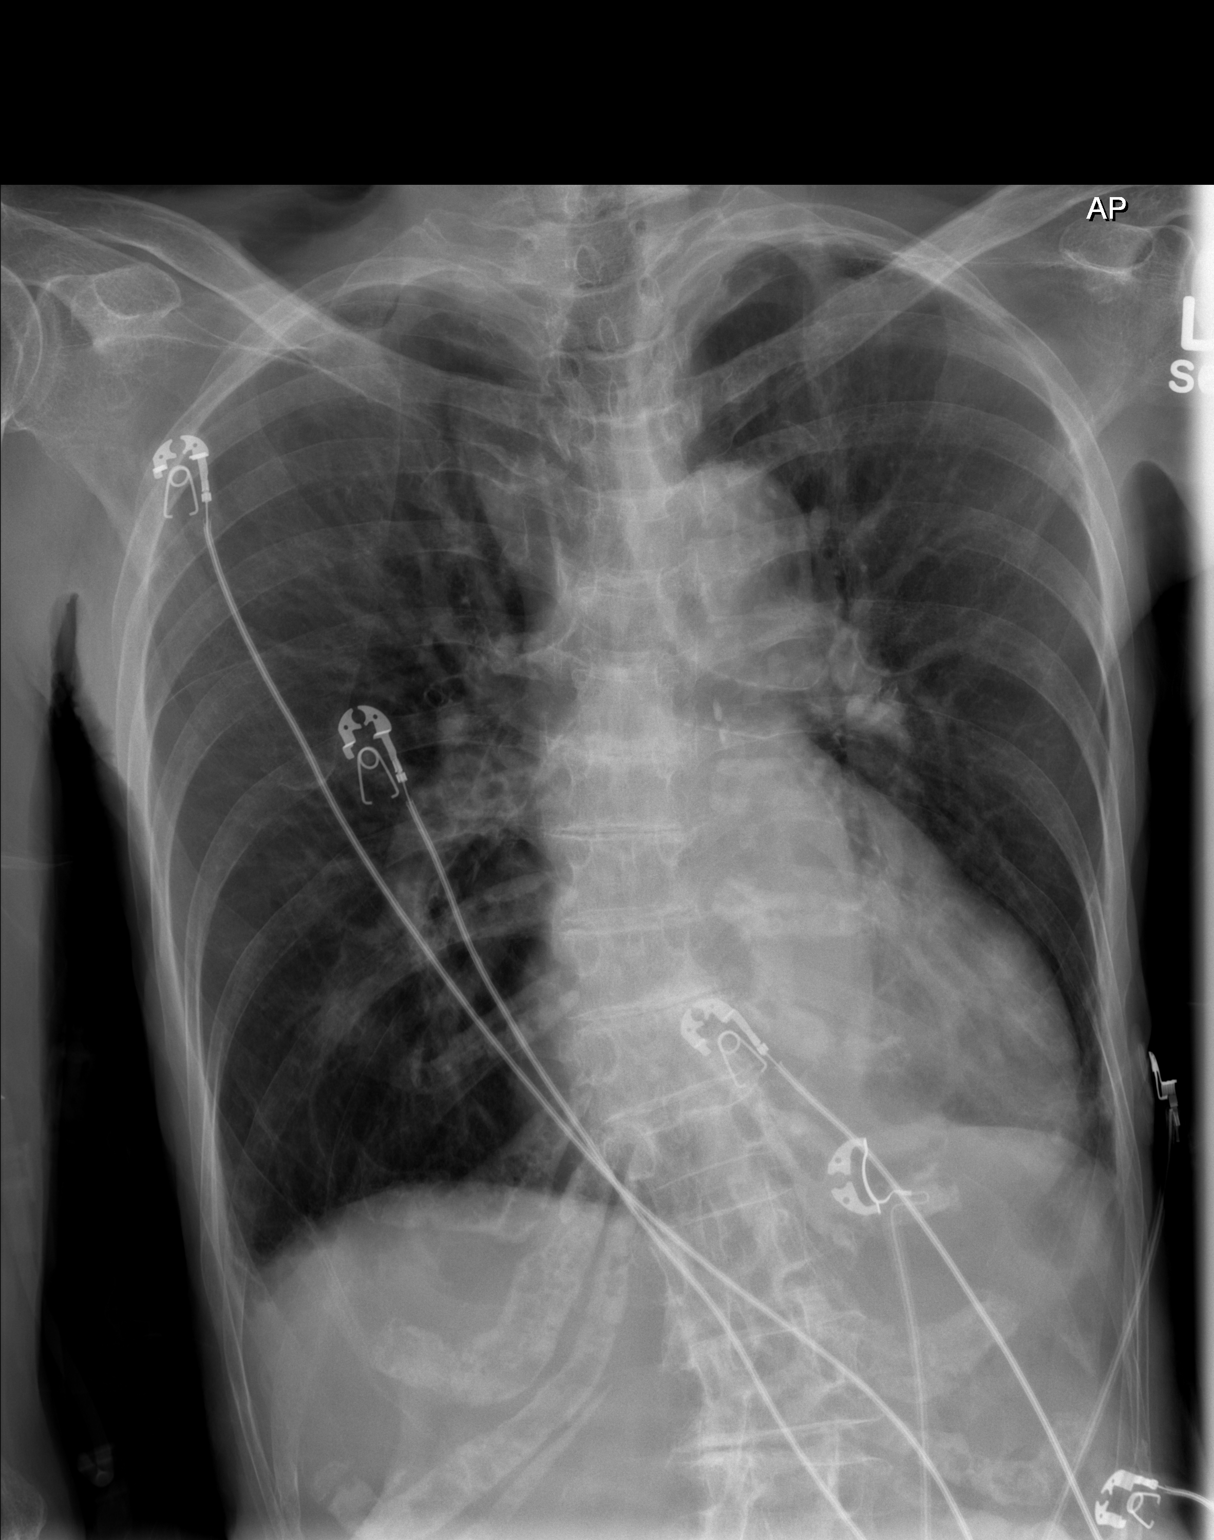

[1 of 1 positions shown; findings below may reference images not displayed]

FINDINGS: Borderline cardiomegaly, similar to previous when accounting for
differences in technique. Aortic and hilar contours are stable.

There is no edema, consolidation, effusion, or pneumothorax.

No appreciable fracture. Scoliotic curvature of the lower thoracic
and lumbar spine is stable.
IMPRESSION: No active disease.

## 2015-11-02 IMAGING — CR DG HIP (WITH OR WITHOUT PELVIS) 2-3V*L*
3 series · 3 of 3 positions shown · non-contrast
Comparison: April 17, 2014

CLINICAL DATA: Pain following fall

EXAM:
LEFT HIP (WITH PELVIS) 2-3 VIEWS

[t pelvis ap]
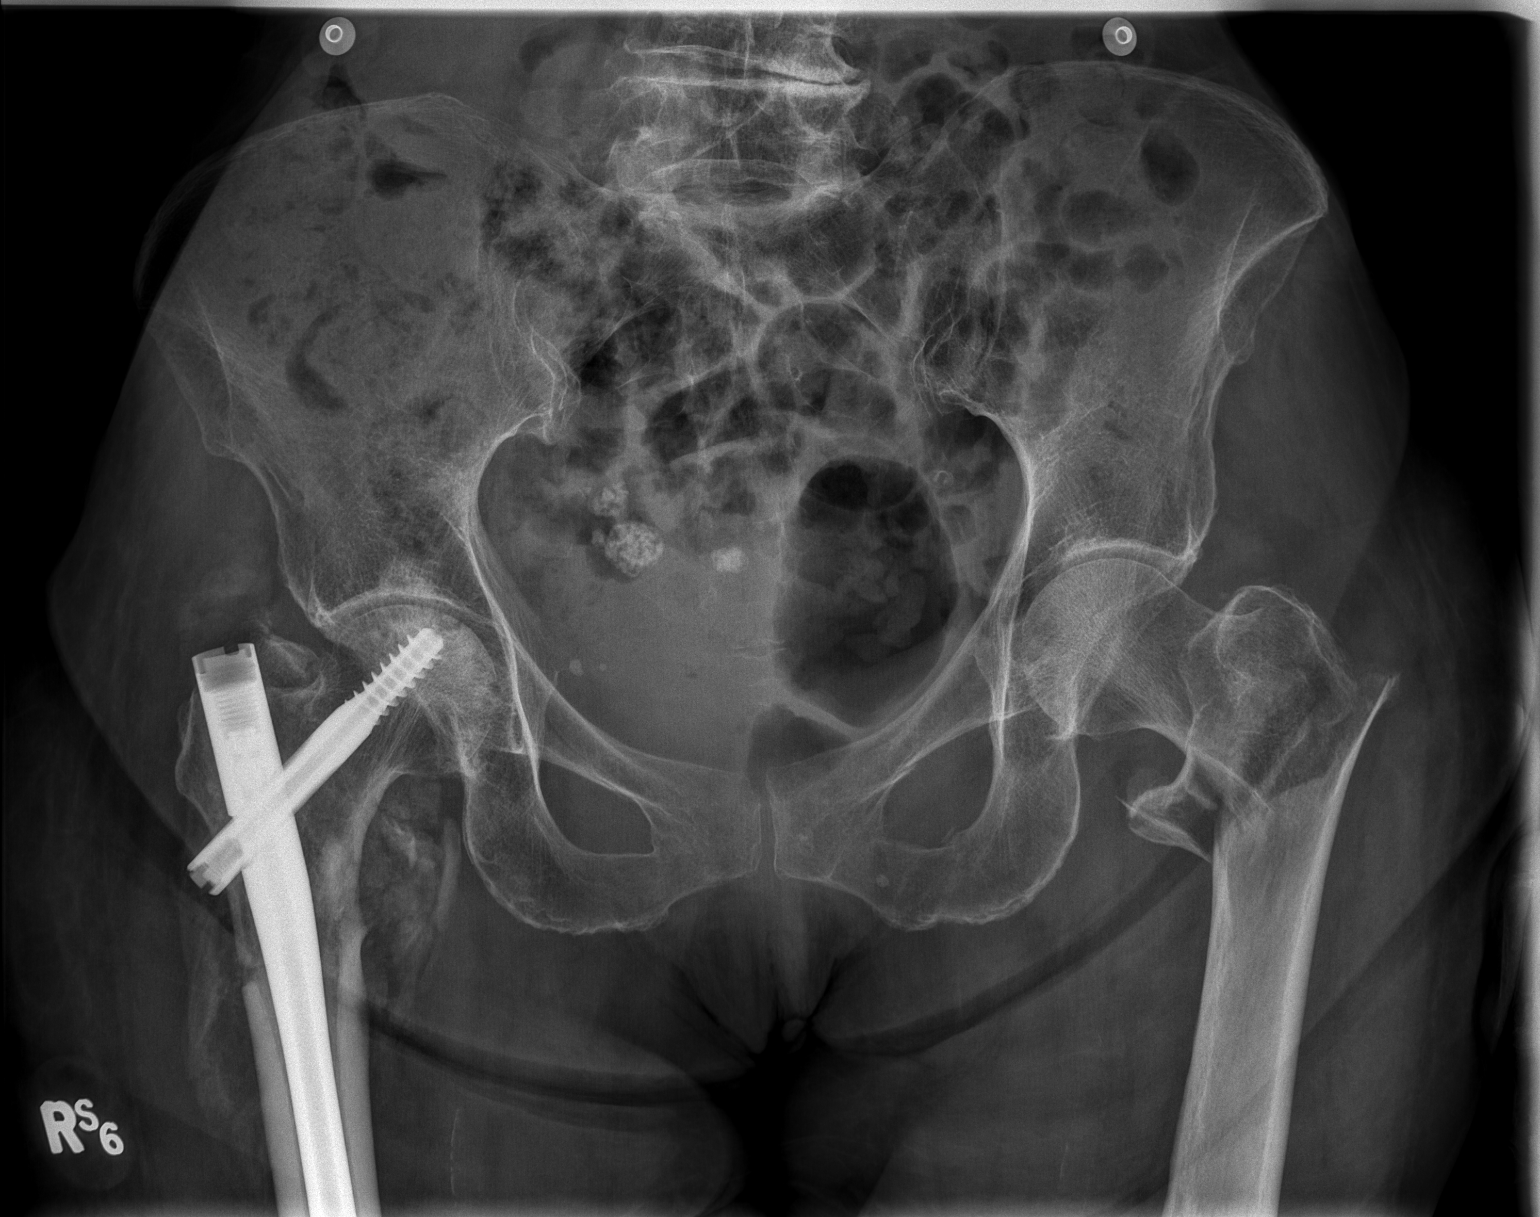

[t hip ap left]
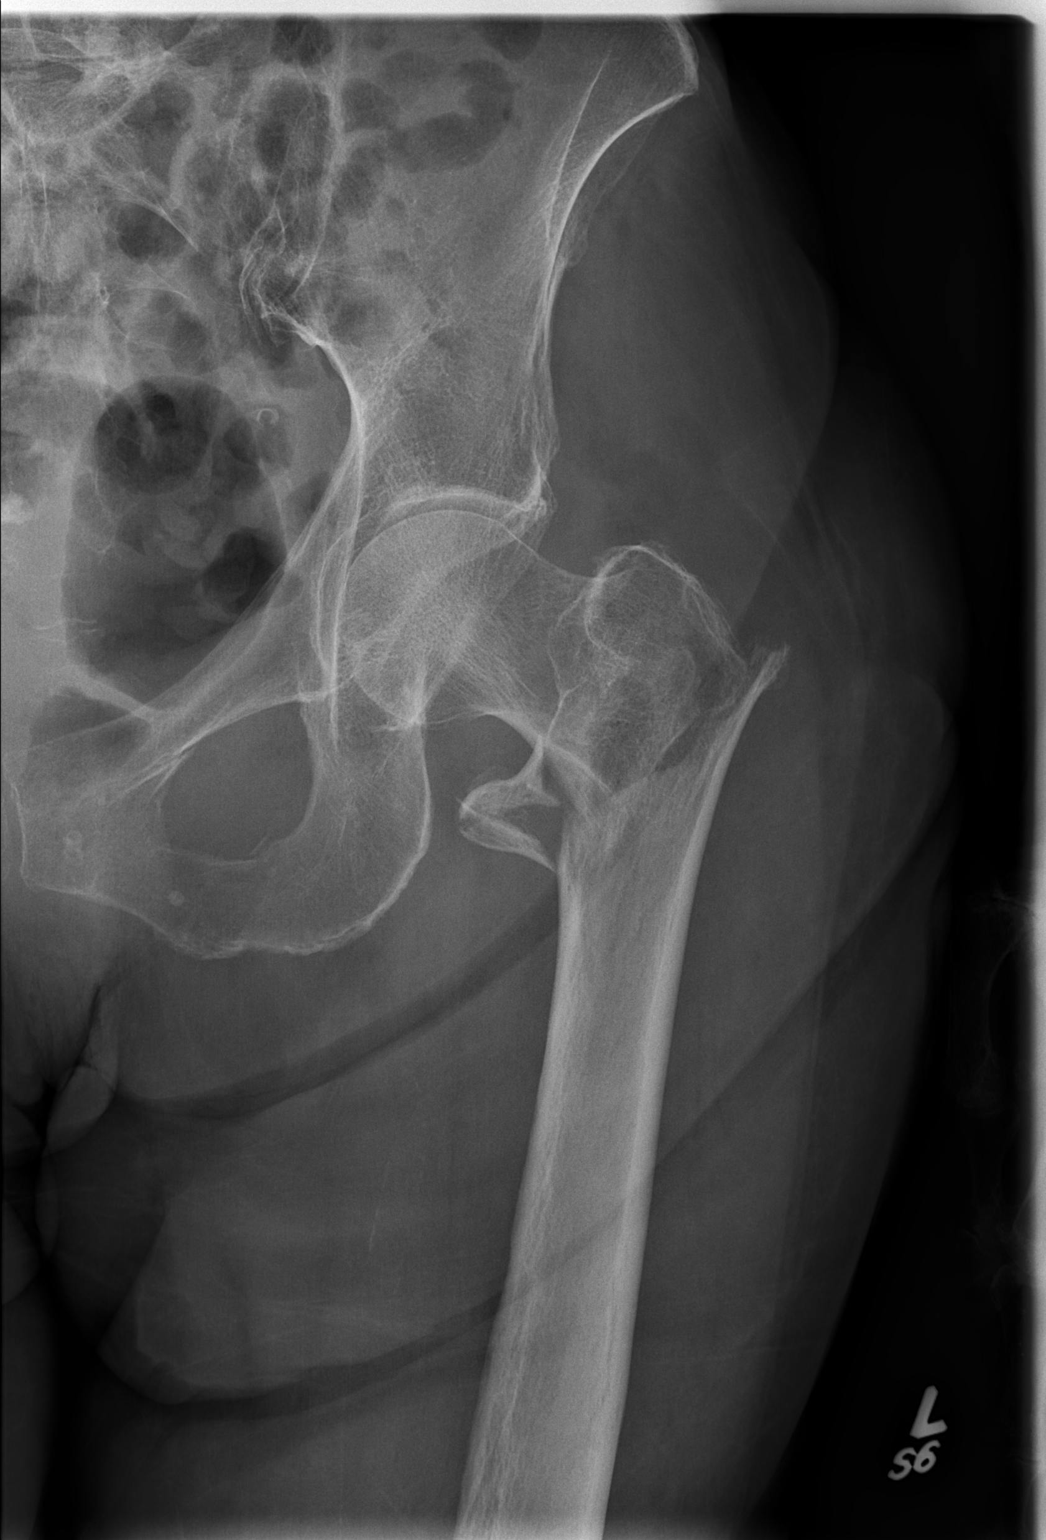

[w hip lat left]
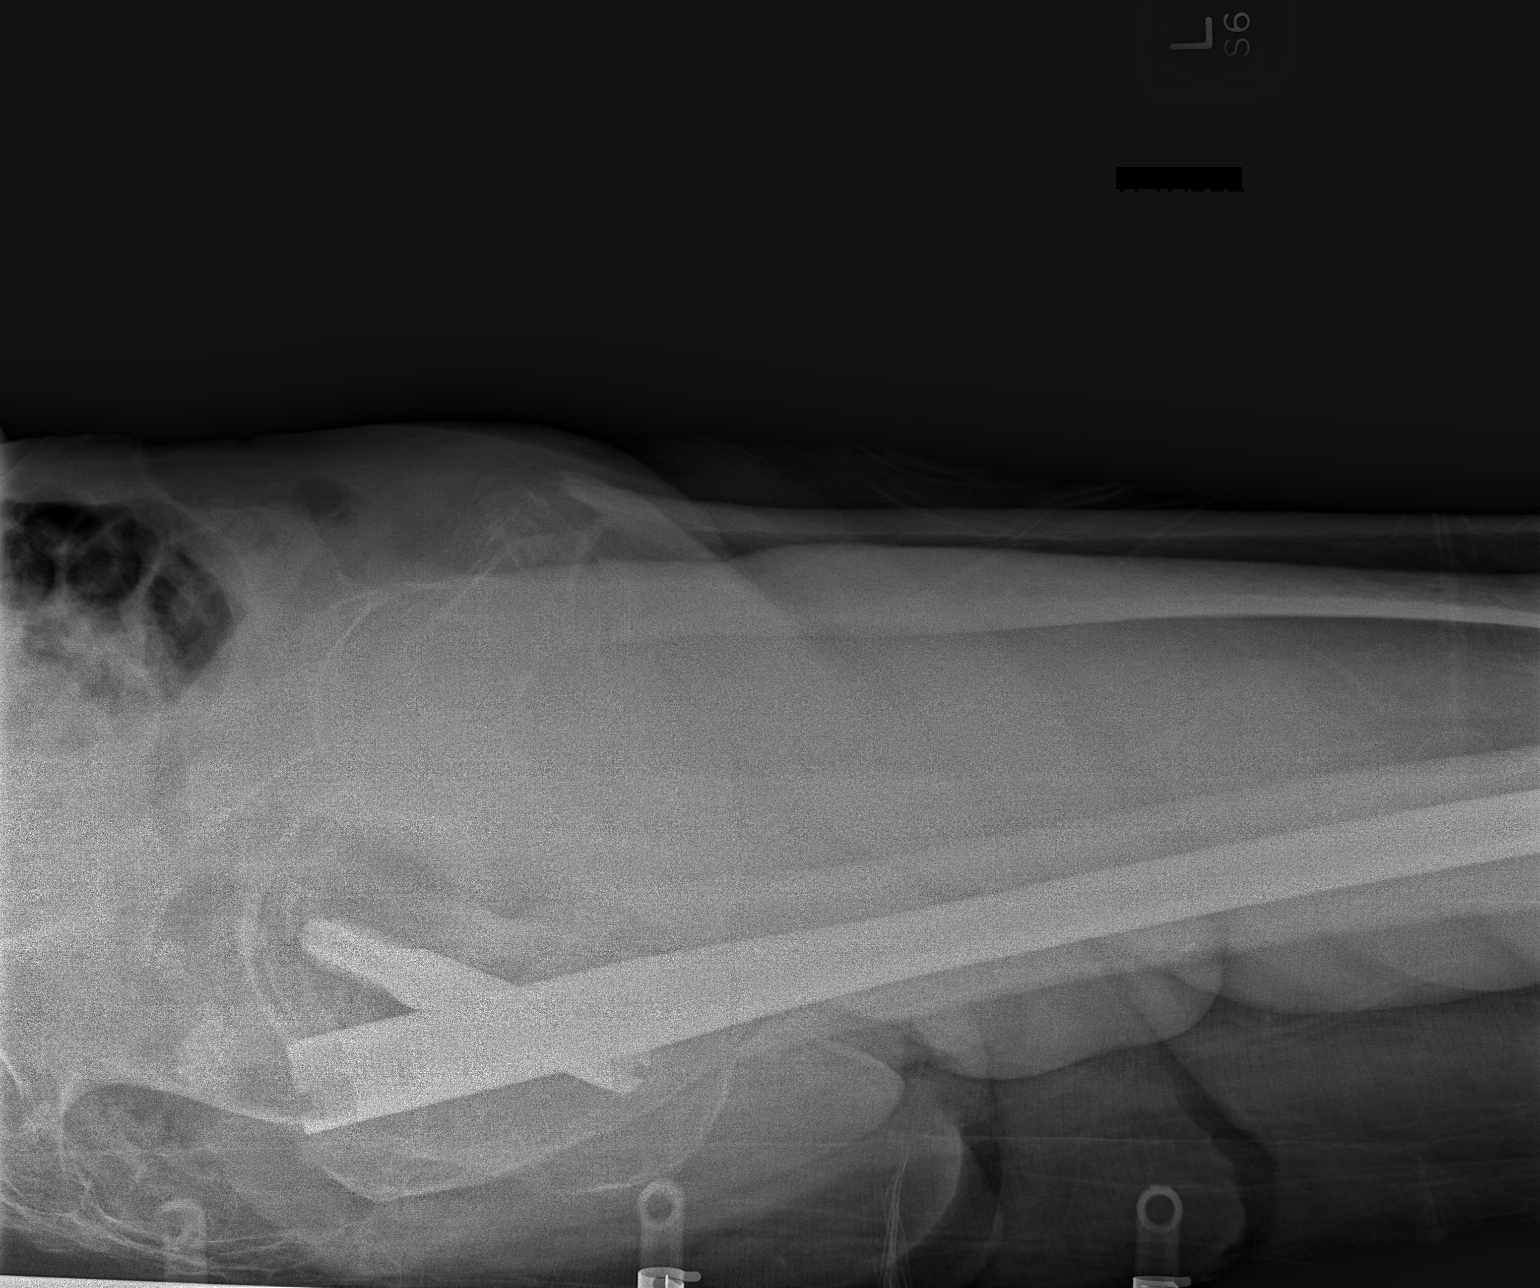

[3 of 3 positions shown; findings below may reference images not displayed]

FINDINGS: Frontal pelvis as well as frontal and lateral left hip images were
obtained. There is a comminuted fracture of the left
intertrochanteric region with varus angulation at the fracture site.
There is avulsion of the lesser trochanter medially. On the right,
there is postoperative change with screw and rod fixation through a
recent intertrochanteric and subtrochanteric femur fracture with
major fracture fragments in near anatomic alignment. No dislocations
appreciable. There is moderate narrowing of both hip joints. There
are small calcified uterine leiomyomas within the pelvis. There is
degenerative change in the lower lumbar spine.
IMPRESSION: Comminuted intertrochanteric femur fracture on the left with varus
angulation at the fracture site and avulsion of the lesser
trochanter. Postoperative change on the right. Moderate narrowing
both hip joints. No dislocation.

## 2015-11-02 IMAGING — DX DG FEMUR 2+V*L*
2 series · 2 of 2 positions shown · non-contrast
Comparison: Pelvis and left hip images obtained earlier in the day

CLINICAL DATA: Left intertrochanteric fracture

EXAM:
LEFT FEMUR 2 VIEWS

[femur ap]
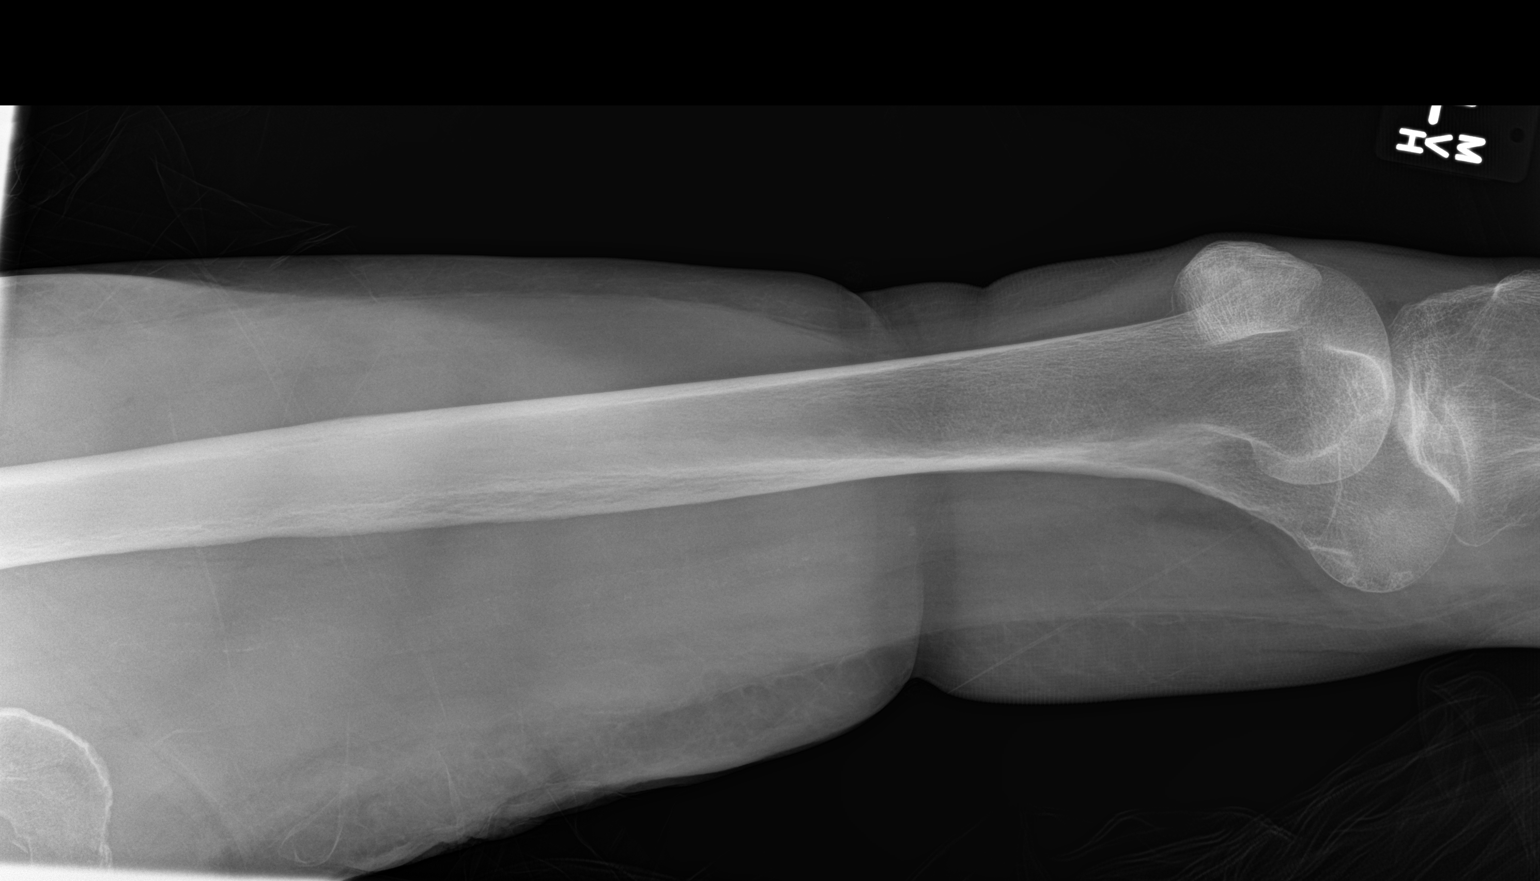

[femur lat]
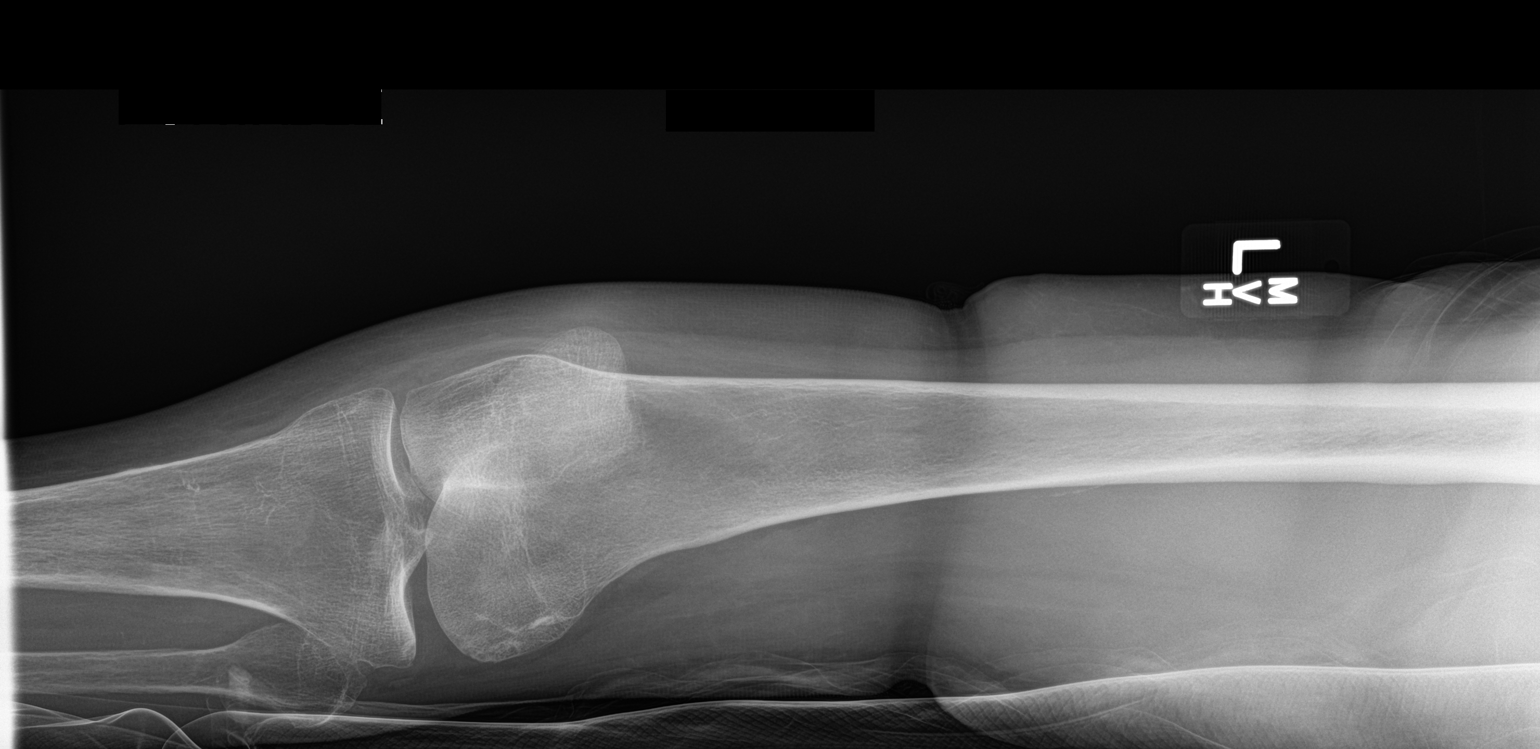

[2 of 2 positions shown; findings below may reference images not displayed]

FINDINGS: Frontal and lateral views obtained. There is a comminuted
intertrochanteric femur fracture on the left with varus angulation
of the fracture site and medial avulsion of the lesser trochanter.
More distally, no fracture. No dislocation. There is mild narrowing
of the left hip joint.
IMPRESSION: Comminuted intertrochanteric femur fracture proximally. No fracture
more distally. No dislocation. Mild narrowing left hip joint.
